# Patient Record
Sex: Male | Born: 1955 | Race: White | Hispanic: No | Smoking: Former smoker
Health system: Southern US, Community
[De-identification: ages and names within clinical notes are randomized; demographics above are authoritative.]

## PROBLEM LIST (undated history)

## (undated) DIAGNOSIS — R0600 Dyspnea, unspecified: Secondary | ICD-10-CM

## (undated) DIAGNOSIS — F32A Depression, unspecified: Secondary | ICD-10-CM

## (undated) DIAGNOSIS — M1990 Unspecified osteoarthritis, unspecified site: Secondary | ICD-10-CM

## (undated) DIAGNOSIS — F419 Anxiety disorder, unspecified: Secondary | ICD-10-CM

## (undated) DIAGNOSIS — H3412 Central retinal artery occlusion, left eye: Secondary | ICD-10-CM

## (undated) DIAGNOSIS — Z87442 Personal history of urinary calculi: Secondary | ICD-10-CM

## (undated) DIAGNOSIS — G4733 Obstructive sleep apnea (adult) (pediatric): Secondary | ICD-10-CM

## (undated) DIAGNOSIS — J189 Pneumonia, unspecified organism: Secondary | ICD-10-CM

## (undated) DIAGNOSIS — F329 Major depressive disorder, single episode, unspecified: Secondary | ICD-10-CM

## (undated) DIAGNOSIS — R7303 Prediabetes: Secondary | ICD-10-CM

## (undated) DIAGNOSIS — I10 Essential (primary) hypertension: Secondary | ICD-10-CM

## (undated) DIAGNOSIS — E785 Hyperlipidemia, unspecified: Secondary | ICD-10-CM

## (undated) DIAGNOSIS — K219 Gastro-esophageal reflux disease without esophagitis: Secondary | ICD-10-CM

## (undated) HISTORY — PX: COLONOSCOPY: SHX174

## (undated) HISTORY — DX: Essential (primary) hypertension: I10

## (undated) HISTORY — DX: Depression, unspecified: F32.A

## (undated) HISTORY — DX: Hyperlipidemia, unspecified: E78.5

## (undated) HISTORY — DX: Gastro-esophageal reflux disease without esophagitis: K21.9

## (undated) HISTORY — PX: LOOP RECORDER IMPLANT: SHX5954

---
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
## 2003-02-22 ENCOUNTER — Encounter: Payer: Self-pay | Admitting: Cardiovascular Disease

## 2003-02-22 ENCOUNTER — Encounter (HOSPITAL_COMMUNITY): Admission: RE | Admit: 2003-02-22 | Discharge: 2003-03-24 | Payer: Self-pay | Admitting: Cardiovascular Disease

## 2004-06-25 ENCOUNTER — Ambulatory Visit: Payer: Self-pay | Admitting: Family Medicine

## 2004-10-09 ENCOUNTER — Ambulatory Visit: Payer: Self-pay | Admitting: Family Medicine

## 2004-12-27 ENCOUNTER — Ambulatory Visit: Payer: Self-pay | Admitting: Family Medicine

## 2005-02-06 ENCOUNTER — Ambulatory Visit: Payer: Self-pay | Admitting: Family Medicine

## 2005-03-06 ENCOUNTER — Ambulatory Visit: Payer: Self-pay | Admitting: Family Medicine

## 2005-06-06 ENCOUNTER — Ambulatory Visit: Payer: Self-pay | Admitting: Family Medicine

## 2005-06-13 ENCOUNTER — Ambulatory Visit: Payer: Self-pay | Admitting: Family Medicine

## 2005-06-18 ENCOUNTER — Ambulatory Visit: Payer: Self-pay | Admitting: Family Medicine

## 2005-09-10 ENCOUNTER — Ambulatory Visit: Payer: Self-pay | Admitting: Family Medicine

## 2005-10-15 ENCOUNTER — Ambulatory Visit: Payer: Self-pay | Admitting: Family Medicine

## 2005-10-30 ENCOUNTER — Ambulatory Visit: Payer: Self-pay | Admitting: Family Medicine

## 2005-12-16 ENCOUNTER — Ambulatory Visit: Payer: Self-pay | Admitting: Family Medicine

## 2006-01-09 ENCOUNTER — Ambulatory Visit: Payer: Self-pay | Admitting: Family Medicine

## 2006-02-06 ENCOUNTER — Ambulatory Visit: Payer: Self-pay | Admitting: Family Medicine

## 2006-06-16 ENCOUNTER — Ambulatory Visit: Payer: Self-pay | Admitting: Family Medicine

## 2006-11-28 ENCOUNTER — Ambulatory Visit: Payer: Self-pay | Admitting: Physician Assistant

## 2014-03-30 DIAGNOSIS — E785 Hyperlipidemia, unspecified: Secondary | ICD-10-CM | POA: Insufficient documentation

## 2014-03-30 DIAGNOSIS — F411 Generalized anxiety disorder: Secondary | ICD-10-CM | POA: Insufficient documentation

## 2016-01-12 DIAGNOSIS — I10 Essential (primary) hypertension: Secondary | ICD-10-CM | POA: Insufficient documentation

## 2016-01-12 DIAGNOSIS — K219 Gastro-esophageal reflux disease without esophagitis: Secondary | ICD-10-CM | POA: Insufficient documentation

## 2016-01-12 DIAGNOSIS — E785 Hyperlipidemia, unspecified: Secondary | ICD-10-CM | POA: Insufficient documentation

## 2016-01-12 DIAGNOSIS — E559 Vitamin D deficiency, unspecified: Secondary | ICD-10-CM | POA: Insufficient documentation

## 2016-01-18 DIAGNOSIS — R7309 Other abnormal glucose: Secondary | ICD-10-CM | POA: Insufficient documentation

## 2016-04-25 DIAGNOSIS — F321 Major depressive disorder, single episode, moderate: Secondary | ICD-10-CM | POA: Insufficient documentation

## 2019-04-09 ENCOUNTER — Ambulatory Visit: Payer: Self-pay | Admitting: Physician Assistant

## 2019-04-09 ENCOUNTER — Other Ambulatory Visit: Payer: Self-pay

## 2019-04-12 ENCOUNTER — Ambulatory Visit (INDEPENDENT_AMBULATORY_CARE_PROVIDER_SITE_OTHER): Payer: Self-pay | Admitting: Physician Assistant

## 2019-04-12 ENCOUNTER — Encounter: Payer: Self-pay | Admitting: Physician Assistant

## 2019-04-12 ENCOUNTER — Other Ambulatory Visit: Payer: Self-pay

## 2019-04-12 VITALS — BP 135/70 | HR 82 | Temp 99.8°F | Ht 68.0 in | Wt 243.2 lb

## 2019-04-12 DIAGNOSIS — F411 Generalized anxiety disorder: Secondary | ICD-10-CM

## 2019-04-12 DIAGNOSIS — E785 Hyperlipidemia, unspecified: Secondary | ICD-10-CM

## 2019-04-12 DIAGNOSIS — K219 Gastro-esophageal reflux disease without esophagitis: Secondary | ICD-10-CM

## 2019-04-12 DIAGNOSIS — I10 Essential (primary) hypertension: Secondary | ICD-10-CM

## 2019-04-12 DIAGNOSIS — F321 Major depressive disorder, single episode, moderate: Secondary | ICD-10-CM

## 2019-04-12 DIAGNOSIS — E559 Vitamin D deficiency, unspecified: Secondary | ICD-10-CM

## 2019-04-12 MED ORDER — BUSPIRONE HCL 10 MG PO TABS: 10.0000 mg | ORAL_TABLET | Freq: Three times a day (TID) | ORAL | 2 refills | Status: DC

## 2019-04-12 MED ORDER — AMLODIPINE BESYLATE 5 MG PO TABS: ORAL_TABLET | ORAL | 3 refills | Status: DC

## 2019-04-12 MED ORDER — ESCITALOPRAM OXALATE 20 MG PO TABS: 20.0000 mg | ORAL_TABLET | Freq: Every day | ORAL | 3 refills | Status: DC

## 2019-04-12 MED ORDER — ATORVASTATIN CALCIUM 10 MG PO TABS: ORAL_TABLET | ORAL | 3 refills | Status: DC

## 2019-04-12 MED ORDER — PANTOPRAZOLE SODIUM 40 MG PO TBEC: 40.0000 mg | DELAYED_RELEASE_TABLET | Freq: Every day | ORAL | 3 refills | Status: DC

## 2019-04-13 NOTE — Progress Notes (Signed)
BP 135/70   Pulse 82   Temp 99.8 F (37.7 C) (Temporal)   Ht 5\' 8"  (1.727 m)   Wt 243 lb 3.2 oz (110.3 kg)   BMI 36.98 kg/m    Subjective:    Patient ID: Derek Ryan, male    DOB: 05/26/1956, 63 y.o.   MRN: [id number]  HPI: Derek Ryan is a 63 y.o. male presenting on 04/12/2019 for New Patient (Initial Visit)  Patient comes in to be established as a new patient.  He was seen through Dr. Edrick Oh for many years.  It is been many years since he has had any kind of cardiovascular evaluation.  He states that sometimes he does have chest pain when he wakes up at night.  He does have some anxiety.  And he states that he will have chest pain and some sweating.  And he does feel quite nervous.  He is not sure if it is either anxiety or cardiac.  He would like to try some medication and if things do not get better he does want to go to cardiology.  I have talked about Dr. Bronson Ing in [place] and he agrees to have an appointment set up there.  He does need labs to be performed and evaluated for this year.  He reports that he has had endoscopy and colonoscopy performed through [facility] providers.  He does have a long history of depression.  He states back in [id number] had a brother who died suddenly in an airplane crash and within a week his mother passed away.  Ever since then he is always that with a lot of depression.  About 4 years ago his wife died.  This also had him setback with depression.  All of his medications and conditions are reviewed.  Patient does have hypertension, GERD, vitamin D deficiency, obesity, depression, anxiety, dyslipidemia.  Depression screen PHQ 2/9 04/12/2019  Decreased Interest 0  Down, Depressed, Hopeless 0  PHQ - 2 Score 0    Past Medical History:  Diagnosis Date  . Depression   . GERD (gastroesophageal reflux disease)   . Hyperlipidemia   . Hypertension    Relevant past medical, surgical, family and social history reviewed and updated as  indicated. Interim medical history since our last visit reviewed. Allergies and medications reviewed and updated. DATA REVIEWED: CHART IN EPIC  Family History reviewed for pertinent findings.  Review of Systems  Constitutional: Negative.  Negative for appetite change and fatigue.  Eyes: Negative for pain and visual disturbance.  Respiratory: Negative.  Negative for cough, chest tightness, shortness of breath and wheezing.   Cardiovascular: Negative.  Negative for chest pain, palpitations and leg swelling.  Gastrointestinal: Negative.  Negative for abdominal pain, diarrhea, nausea and vomiting.  Genitourinary: Negative.   Skin: Negative.  Negative for color change and rash.  Neurological: Negative.  Negative for weakness, numbness and headaches.  Psychiatric/Behavioral: Positive for dysphoric mood. The patient is nervous/anxious.     Allergies as of 04/12/2019      Reactions   Doxycycline Itching      Medication List       Accurate as of April 12, 2019 11:59 PM. If you have any questions, ask your nurse or doctor.        amLODipine 5 MG tablet Commonly known as: NORVASC TAKE ONE TABLET (5 MG DOSE) BY MOUTH DAILY.   aspirin 81 MG chewable tablet Chew by mouth.   atorvastatin 10 MG tablet Commonly known as:  LIPITOR TAKE 1 TABLET BY MOUTH EVERYDAY AT BEDTIME   busPIRone 10 MG tablet Commonly known as: BUSPAR Take 1 tablet (10 mg total) by mouth 3 (three) times daily. For anxiety Started by: Terald Sleeper, PA-C   escitalopram 20 MG tablet Commonly known as: LEXAPRO Take 1 tablet (20 mg total) by mouth daily. What changed:   how much to take  when to take this Changed by: Terald Sleeper, PA-C   Fish Oil 1000 MG Caps Take by mouth.   pantoprazole 40 MG tablet Commonly known as: PROTONIX Take 1 tablet (40 mg total) by mouth daily.          Objective:    BP 135/70   Pulse 82   Temp 99.8 F (37.7 C) (Temporal)   Ht 5\' 8"  (1.727 m)   Wt 243 lb 3.2 oz  (110.3 kg)   BMI 36.98 kg/m   Allergies  Allergen Reactions  . Doxycycline Itching    Wt Readings from Last 3 Encounters:  04/12/19 243 lb 3.2 oz (110.3 kg)    Physical Exam Vitals signs and nursing note reviewed.  Constitutional:      General: He is not in acute distress.    Appearance: He is well-developed.  HENT:     Head: Normocephalic and atraumatic.  Eyes:     Conjunctiva/sclera: Conjunctivae normal.     Pupils: Pupils are equal, round, and reactive to light.  Cardiovascular:     Rate and Rhythm: Normal rate and regular rhythm.     Heart sounds: Normal heart sounds.  Pulmonary:     Effort: Pulmonary effort is normal. No respiratory distress.     Breath sounds: Normal breath sounds.  Skin:    General: Skin is warm and dry.  Psychiatric:        Behavior: Behavior normal.     No results found for this or any previous visit.    Assessment & Plan:   1. GAD (generalized anxiety disorder) - escitalopram (LEXAPRO) 20 MG tablet; Take 1 tablet (20 mg total) by mouth daily.  Dispense: 90 tablet; Refill: 3 - busPIRone (BUSPAR) 10 MG tablet; Take 1 tablet (10 mg total) by mouth 3 (three) times daily. For anxiety  Dispense: 90 tablet; Refill: 2  2. Essential hypertension - aspirin 81 MG chewable tablet; Chew by mouth. - amLODipine (NORVASC) 5 MG tablet; TAKE ONE TABLET (5 MG DOSE) BY MOUTH DAILY.  Dispense: 90 tablet; Refill: 3  3. Gastroesophageal reflux disease without esophagitis - pantoprazole (PROTONIX) 40 MG tablet; Take 1 tablet (40 mg total) by mouth daily.  Dispense: 90 tablet; Refill: 3  4. Vitamin D deficiency  5. Severe obesity (BMI 35.0-39.9) with comorbidity ([place])  6. Moderate single current episode of major depressive disorder (HCC) - escitalopram (LEXAPRO) 20 MG tablet; Take 1 tablet (20 mg total) by mouth daily.  Dispense: 90 tablet; Refill: 3  7. Dyslipidemia - Omega-3 Fatty Acids (FISH OIL) 1000 MG CAPS; Take by mouth. - atorvastatin (LIPITOR) 10  MG tablet; TAKE 1 TABLET BY MOUTH EVERYDAY AT BEDTIME  Dispense: 90 tablet; Refill: 3   Continue all other maintenance medications as listed above.  Follow up plan: No follow-ups on file.  Educational handout given for [place] PA-C [place] [address]  [place], [place] [address] [phone]   04/13/2019, 1:59 PM

## 2019-05-04 ENCOUNTER — Other Ambulatory Visit: Payer: Self-pay | Admitting: Physician Assistant

## 2019-05-04 DIAGNOSIS — F411 Generalized anxiety disorder: Secondary | ICD-10-CM

## 2019-05-10 ENCOUNTER — Ambulatory Visit (INDEPENDENT_AMBULATORY_CARE_PROVIDER_SITE_OTHER): Payer: Self-pay | Admitting: Physician Assistant

## 2019-05-10 ENCOUNTER — Encounter: Payer: Self-pay | Admitting: Physician Assistant

## 2019-05-10 DIAGNOSIS — F321 Major depressive disorder, single episode, moderate: Secondary | ICD-10-CM

## 2019-05-10 NOTE — Progress Notes (Signed)
     Telephone visit  Subjective: [id number] depression PCP: Terald Sleeper, PA-C [id number] Derek Ryan is a 63 y.o. male calls for telephone consult today. Patient provides verbal consent for consult held via phone.  Patient is identified with 2 separate identifiers.  At this time the entire area is on COVID-19 social distancing and stay home orders are in place.  Patient is of higher risk and therefore we are performing this by a virtual method.  Location of patient: home Location of provider: [facility] Others present for call: no   This patient is have a 1 month recheck on his depression anxiety.  We had increased his Lexapro to 20 mg and added BuSpar as a persistent anxiety medication.  He states that he can tell a lot of difference.  And is very happy with how he is feeling.  He will continue with the medications.  Refills will be sent.  We will plan for him to come in a few months for check in the office and labs.  He will be having Medicare in just another couple months.    ROS: Per HPI  Allergies  Allergen Reactions  . Doxycycline Itching   Past Medical History:  Diagnosis Date  . Depression   . GERD (gastroesophageal reflux disease)   . Hyperlipidemia   . Hypertension     Current Outpatient Medications:  .  amLODipine (NORVASC) 5 MG tablet, TAKE ONE TABLET (5 MG DOSE) BY MOUTH DAILY., Disp: 90 tablet, Rfl: 3 .  aspirin 81 MG chewable tablet, Chew by mouth., Disp: , Rfl:  .  atorvastatin (LIPITOR) 10 MG tablet, TAKE 1 TABLET BY MOUTH EVERYDAY AT BEDTIME, Disp: 90 tablet, Rfl: 3 .  busPIRone (BUSPAR) 10 MG tablet, TAKE 1 TABLET (10 MG TOTAL) BY MOUTH 3 (THREE) TIMES DAILY. FOR ANXIETY, Disp: 270 tablet, Rfl: 0 .  escitalopram (LEXAPRO) 20 MG tablet, Take 1 tablet (20 mg total) by mouth daily., Disp: 90 tablet, Rfl: 3 .  Omega-3 Fatty Acids (FISH OIL) 1000 MG CAPS, Take by mouth., Disp: , Rfl:  .  pantoprazole (PROTONIX) 40 MG tablet, Take 1 tablet (40 mg total) by mouth  daily., Disp: 90 tablet, Rfl: 3  Assessment/ Plan: 63 y.o. male   1. Moderate single current episode of major depressive disorder (HCC) Anxiety Continue BuSpar 10 mg 1 3 times a day Lexapro 20 mg once daily Recheck 3 months   Return in about 3 months (around 08/09/2019) for recheck medications and lab.  Continue all other maintenance medications as listed above.  Start time: 10:41 AM End time: 10:52 AM  No orders of the defined types were placed in this encounter.   Particia Nearing PA-C [place] [phone]

## 2019-05-24 ENCOUNTER — Encounter: Payer: Self-pay | Admitting: Family

## 2019-05-24 ENCOUNTER — Ambulatory Visit (INDEPENDENT_AMBULATORY_CARE_PROVIDER_SITE_OTHER): Payer: Self-pay | Admitting: Family

## 2019-05-24 DIAGNOSIS — J029 Acute pharyngitis, unspecified: Secondary | ICD-10-CM

## 2019-05-24 MED ORDER — AMOXICILLIN 875 MG PO TABS: 875.0000 mg | ORAL_TABLET | Freq: Two times a day (BID) | ORAL | 0 refills | Status: DC

## 2019-05-24 NOTE — Progress Notes (Signed)
Virtual Visit via telephone Note Due to COVID-19 pandemic this visit was conducted virtually. This visit type was conducted due to national recommendations for restrictions regarding the COVID-19 Pandemic (e.g. social distancing, sheltering in place) in an effort to limit this patient's exposure and mitigate transmission in our community. All issues noted in this document were discussed and addressed.  A physical exam was not performed with this format.  I connected with Derek Ryan on 05/24/19 at 10:00 AM  by telephone and verified that I am speaking with the correct person using two identifiers. Derek Ryan is currently located at home and no one is currently with him  during visit. The provider, Evelina Dun, FNP is located in their office at time of visit.  I discussed the limitations, risks, security and privacy concerns of performing an evaluation and management service by telephone and the availability of in person appointments. I also discussed with the patient that there may be a patient responsible charge related to this service. The patient expressed understanding and agreed to proceed.   History and Present Illness:   Headache  Associated symptoms include a fever and a sore throat.   Pt calls the office today with headache, body aches, fever, fatigue, and sore throat that started on 05/19/19. He reports he feels slightly better, but still not good. He reports he took a COVD test yesterday at [facility], but will be 2-3 days until his result return. He states his fever has been around 101. Denies any cough or SOB. States his symptoms started with a sore throat and has progressed to body aches and fever. He reports white patches in the back of his throat. He states his throat may be slightly better today. Denies any swollen lymph nodes.   Review of Systems  Constitutional: Positive for chills, fever and malaise/fatigue.  HENT: Positive for sore throat.   All other systems reviewed  and are negative.    Observations/Objective: No SOB or distress noted. He does sound slightly hoarse  Assessment and Plan: 1. Acute pharyngitis, unspecified etiology - Take meds as prescribed - Use a cool mist humidifier  -Use saline nose sprays frequently -Force fluids -For any cough or congestion  Use plain Mucinex- regular strength or max strength is fine -For fever or aces or pains- take tylenol or ibuprofen. -Throat lozenges if help -New toothbrush in 3 days -COVID test pending, he will call and let us know resutls Call if symptoms worsen or do not improve  - amoxicillin (AMOXIL) 875 MG tablet; Take 1 tablet (875 mg total) by mouth 2 (two) times daily.  Dispense: 14 tablet; Refill: 0     I discussed the assessment and treatment plan with the patient. The patient was provided an opportunity to ask questions and all were answered. The patient agreed with the plan and demonstrated an understanding of the instructions.   The patient was advised to call back or seek an in-person evaluation if the symptoms worsen or if the condition fails to improve as anticipated.  The above assessment and management plan was discussed with the patient. The patient verbalized understanding of and has agreed to the management plan. Patient is aware to call the clinic if symptoms persist or worsen. Patient is aware when to return to the clinic for a follow-up visit. Patient educated on when it is appropriate to go to the emergency department.   Time call ended:  10:17 AM   I provided 17 minutes of non-face-to-face time during  this encounter.    Evelina Dun, FNP

## 2019-05-25 ENCOUNTER — Telehealth: Payer: Self-pay | Admitting: Physician Assistant

## 2019-05-25 MED ORDER — ALBUTEROL SULFATE HFA 108 (90 BASE) MCG/ACT IN AERS: 2.0000 | INHALATION_SPRAY | Freq: Four times a day (QID) | RESPIRATORY_TRACT | 0 refills | Status: DC | PRN

## 2019-05-25 NOTE — Telephone Encounter (Signed)
Ok, pt does not need to take the amoxicillin as he more than likely does not have strep. I have sent albuterol inhaler for him. If his SOB worsens he needs to call or go to ED!!!

## 2019-05-25 NOTE — Telephone Encounter (Signed)
Patient aware and verbalizes understanding. 

## 2019-06-12 ENCOUNTER — Other Ambulatory Visit: Payer: Self-pay | Admitting: Family

## 2019-06-22 ENCOUNTER — Encounter: Payer: Self-pay | Admitting: Cardiology

## 2019-06-24 ENCOUNTER — Ambulatory Visit (INDEPENDENT_AMBULATORY_CARE_PROVIDER_SITE_OTHER): Payer: Medicare Other | Admitting: Cardiology

## 2019-06-24 ENCOUNTER — Encounter: Payer: Self-pay | Admitting: Cardiology

## 2019-06-24 ENCOUNTER — Other Ambulatory Visit: Payer: Self-pay

## 2019-06-24 VITALS — BP 138/69 | HR 90 | Ht 68.0 in | Wt 231.5 lb

## 2019-06-24 DIAGNOSIS — I5031 Acute diastolic (congestive) heart failure: Secondary | ICD-10-CM | POA: Diagnosis not present

## 2019-06-24 DIAGNOSIS — I429 Cardiomyopathy, unspecified: Secondary | ICD-10-CM

## 2019-06-24 DIAGNOSIS — R0609 Other forms of dyspnea: Secondary | ICD-10-CM

## 2019-06-24 DIAGNOSIS — I10 Essential (primary) hypertension: Secondary | ICD-10-CM

## 2019-06-24 DIAGNOSIS — R0600 Dyspnea, unspecified: Secondary | ICD-10-CM | POA: Diagnosis not present

## 2019-06-24 DIAGNOSIS — I428 Other cardiomyopathies: Secondary | ICD-10-CM

## 2019-06-24 DIAGNOSIS — E7800 Pure hypercholesterolemia, unspecified: Secondary | ICD-10-CM

## 2019-06-24 DIAGNOSIS — R739 Hyperglycemia, unspecified: Secondary | ICD-10-CM

## 2019-06-24 MED ORDER — POTASSIUM CHLORIDE ER 10 MEQ PO TBCR: 10.0000 meq | EXTENDED_RELEASE_TABLET | ORAL | 0 refills | Status: DC

## 2019-06-24 MED ORDER — FUROSEMIDE 20 MG PO TABS: 20.0000 mg | ORAL_TABLET | ORAL | 0 refills | Status: DC

## 2019-06-24 NOTE — Progress Notes (Signed)
Primary Physician/Referring:  Terald Sleeper, PA-C  Patient ID: Derek Ryan, male    DOB: 1956-04-15, 63 y.o.   MRN: [id number]  Chief Complaint  Patient presents with  . New Patient (Initial Visit)  . Palpitations  . Chest Pain  . Shortness of Breath   HPI:    Derek Ryan  is a 63 y.o. Caucasian male with hypertension, hyperlipidemia, hyperglycemia, anxiety and depression, admitted to [facility], [place], [place] with bilateral coving 19 pneumonia on 05/24/2019 and discharged 6 days later.  The hospital he was found to have type II non-STEMI due to severe hypoxemia and also echocardiogram revealed moderate to severe tricuspid regurgitation and pulmonary hypertension.  He was referred to be further evaluated from cardiac standpoint.  Since hospitalization, patient has gradually improved overall however continues to have significant shortness of breath and especially orthopnea.  He is also noticed exertional chest tightness and pressure-like sensation in the middle of the chest.  He had not had these episodes prior to presentation with Covid 19.  Denies leg swelling, he does not smoke, quit smoking remotely and does not drink alcohol.  Past Medical History:  Diagnosis Date  . Depression   . GERD (gastroesophageal reflux disease)   . Hyperlipidemia   . Hypertension    History reviewed. No pertinent surgical history. Social History   Socioeconomic History  . Marital status: Widowed    Spouse name: Not on file  . Number of children: 3  . Years of education: Not on file  . Highest education level: Not on file  Occupational History  . Not on file  Social Needs  . Financial resource strain: Not on file  . Food insecurity    Worry: Not on file    Inability: Not on file  . Transportation needs    Medical: Not on file    Non-medical: Not on file  Tobacco Use  . Smoking status: Former Smoker    Packs/day: 1.00    Years: 23.00    Pack years: 23.00    Types: Cigarettes     Quit date: 1992    Years since quitting: 28.8  . Smokeless tobacco: Never Used  Substance and Sexual Activity  . Alcohol use: Not Currently    Frequency: Never    Comment: stopped in 1997  . Drug use: Never  . Sexual activity: Not on file  Lifestyle  . Physical activity    Days per week: Not on file    Minutes per session: Not on file  . Stress: Not on file  Relationships  . Social Herbalist on phone: Not on file    Gets together: Not on file    Attends religious service: Not on file    Active member of club or organization: Not on file    Attends meetings of clubs or organizations: Not on file    Relationship status: Not on file  . Intimate partner violence    Fear of current or ex partner: Not on file    Emotionally abused: Not on file    Physically abused: Not on file    Forced sexual activity: Not on file  Other Topics Concern  . Not on file  Social History Narrative  . Not on file   ROS  Review of Systems  Constitution: Negative for chills, decreased appetite, malaise/fatigue and weight gain.  Cardiovascular: Positive for dyspnea on exertion, orthopnea and paroxysmal nocturnal dyspnea. Negative for leg swelling and syncope.  Endocrine: Negative for cold intolerance.  Hematologic/Lymphatic: Does not bruise/bleed easily.  Musculoskeletal: Positive for muscle cramps (occasional leg cramps with activity). Negative for joint swelling.  Gastrointestinal: Negative for abdominal pain, anorexia, change in bowel habit, hematochezia and melena.  Neurological: Negative for headaches and light-headedness.  Psychiatric/Behavioral: Negative for depression and substance abuse.  All other systems reviewed and are negative.  Objective   Vitals with BMI 06/24/2019 04/12/2019  Height '5\' 8"'  '5\' 8"'   Weight 231 lbs 8 oz 243 lbs 3 oz  BMI 64.15 83.09  Systolic 407 680  Diastolic 69 70  Pulse 90 82    Physical Exam  Constitutional:  Well-built, moderately obese, no  acute distress.  HENT:  Head: Atraumatic.  Eyes: Conjunctivae are normal.  Neck: Neck supple. No thyromegaly present.  Short neck and difficult to evaluate JVP  Cardiovascular: Normal rate, regular rhythm and normal heart sounds. Exam reveals no gallop.  No murmur heard. Pulses:      Carotid pulses are 2+ on the right side and 2+ on the left side.      Dorsalis pedis pulses are 1+ on the right side and 1+ on the left side.       Posterior tibial pulses are 1+ on the right side and 1+ on the left side.  Femoral and popliteal pulse difficult to feel due to patient's body habitus.   Pulmonary/Chest: Effort normal and breath sounds normal.  Abdominal: Soft. Bowel sounds are normal.  Obese.t  Musculoskeletal: Normal range of motion.        General: No edema.  Neurological: He is alert.  Skin: Skin is warm and dry.  Psychiatric: He has a normal mood and affect.    Laboratory examination:   CBC And DifferentialResulted: 07/28/2018 5:36 AM [facility] Component Name Value Ref Range  WBC 6.2 3.4 - 10.8 x10E3/uL  RBC 4.92 4.14 - 5.8 x10E6/uL  Hemoglobin 14.8 13 - 17.7 g/dL  Hematocrit 44.3 37.5 - 51 %  MCV 90 79 - 97 fL  MCH 30.1 26.6 - 33 pg  MCHC 33.4 31.5 - 35.7 g/dL  RDW 12.3    Comprehensive Metabolic PanelResulted: 88/06/314 5:36 AM [facility] Component Name Value Ref Range  Glucose 108 (H) 65 - 99 mg/dL  BUN 20 8 - 27 mg/dL  Creatinine, Serum 0.89 0.76 - 1.27 mg/dL  eGFR If NonAfrican American 90 >59 mL/min/1.73  eGFR If African American 104 >59 mL/min/1.73  BUN/Creatinine Ratio 22 10 - 24   Sodium 142 134 - 144 mmol/L  Potassium 4.6 3.5 - 5.2 mmol/L  Chloride 101 96 - 106 mmol/L  CO2 24 20 - 29 mmol/L  CALCIUM 9.7 8.6 - 10.2 mg/dL  Total Protein 7.1 6 - 8.5 g/dL  Albumin, Serum 4.6 3.6 - 4.8 g/dL  Globulin, Total 2.5 1.5 - 4.5 g/dL  Albumin/Globulin Ratio 1.8 1.2 - 2.2   Total Bilirubin 0.5 0 - 1.2 mg/dL  Alkaline Phosphatase 94 39 - 117 IU/L  AST 19 0 -  40 IU/L  ALT (SGPT) 29 0 - 44 IU/L   Lipid PanelResulted: 07/28/2018 5:36 AM [facility] Component Name Value Ref Range  Cholesterol, Total 121 100 - 199 mg/dL  Triglycerides 101 0 - 149 mg/dL  HDL 41 >39 mg/dL  VLDL Cholesterol Cal 20 5 - 40 mg/dL  LDL 60 0 - 99 mg/dL   TSHLast Updated: 01/12/2016 [facility] Component Name Value Ref Range  TSH 1.660 0.450 - 4.500 uIU/mL     No results  for input(s): NA, K, CL, CO2, GLUCOSE, BUN, CREATININE, CALCIUM, GFRNONAA, GFRAA in the last 8760 hours. No flowsheet data found. No flowsheet data found. Lipid Panel  No results found for: CHOL, TRIG, HDL, CHOLHDL, VLDL, LDLCALC, LDLDIRECT HEMOGLOBIN A1C No results found for: HGBA1C, MPG TSH No results for input(s): TSH in the last 8760 hours. Medications and allergies   Allergies  Allergen Reactions  . Doxycycline Itching     Prior to Admission medications   Medication Sig Start Date End Date Taking? Authorizing Provider  albuterol (VENTOLIN HFA) 108 (90 Base) MCG/ACT inhaler TAKE 2 PUFFS BY MOUTH EVERY 6 HOURS AS NEEDED FOR WHEEZE OR SHORTNESS OF BREATH 06/14/19   Terald Sleeper, PA-C  amLODipine (NORVASC) 5 MG tablet TAKE ONE TABLET (5 MG DOSE) BY MOUTH DAILY. 04/12/19   Terald Sleeper, PA-C  aspirin 81 MG chewable tablet Chew by mouth daily.    [provider]  atorvastatin (LIPITOR) 10 MG tablet TAKE 1 TABLET BY MOUTH EVERYDAY AT BEDTIME 04/12/19   Terald Sleeper, PA-C  busPIRone (BUSPAR) 10 MG tablet TAKE 1 TABLET (10 MG TOTAL) BY MOUTH 3 (THREE) TIMES DAILY. FOR ANXIETY 05/06/19   Terald Sleeper, PA-C  escitalopram (LEXAPRO) 20 MG tablet Take 1 tablet (20 mg total) by mouth daily. 04/12/19   Terald Sleeper, PA-C  Omega-3 Fatty Acids (FISH OIL) 1000 MG CAPS Take by mouth.    [provider]     Current Outpatient Medications  Medication Instructions  . albuterol (VENTOLIN HFA) 108 (90 Base) MCG/ACT inhaler TAKE 2 PUFFS BY MOUTH EVERY 6 HOURS AS NEEDED FOR WHEEZE  OR SHORTNESS OF BREATH  . amLODipine (NORVASC) 5 MG tablet TAKE ONE TABLET (5 MG DOSE) BY MOUTH DAILY.  [name] aspirin 81 MG chewable tablet Oral, Daily  . atorvastatin (LIPITOR) 10 MG tablet TAKE 1 TABLET BY MOUTH EVERYDAY AT BEDTIME  . busPIRone (BUSPAR) 10 mg, Oral, 3 times daily, For anxiety  . cholecalciferol (VITAMIN D3) 2,000 Units, Oral, Daily  . escitalopram (LEXAPRO) 20 mg, Oral, Daily  . furosemide (LASIX) 20 mg, Oral, BH-each morning  . Omega-3 Fatty Acids (FISH OIL) 1000 MG CAPS Oral  . potassium chloride (KLOR-CON) 10 MEQ tablet 10 mEq, Oral, BH-each morning    Radiology:  No results found.  Cardiac Studies:   Echocardiogram 05/27/2019:  Normal LV size and function, EF 55%.  Severe LVH.  Increased echogenicity of myocardium, strongly recommended investigation for infiltrative diseases. Normal RV size and function.  Moderate mitral regurgitation, moderate to severe tricuspid regurgitation, severe pulmonary hypertension.  PA systolic pressure 67 mmHg, RV pressure 7 mmHg.   Assessment     ICD-10-CM   1. Dyspnea on exertion  R06.00 EKG 12-Lead    furosemide (LASIX) 20 MG tablet    potassium chloride (KLOR-CON) 10 MEQ tablet    PCV MYOCARDIAL PERFUSION WO LEXISCAN    MR CARDIAC MORPHOLOGY W WO CONTRAST  2. Acute diastolic CHF (congestive heart failure) (HCC)  I50.31 furosemide (LASIX) 20 MG tablet    potassium chloride (KLOR-CON) 10 MEQ tablet    PCV MYOCARDIAL PERFUSION WO LEXISCAN    MR CARDIAC MORPHOLOGY W WO CONTRAST  3. Essential hypertension  I10   4. Pure hypercholesterolemia  E78.00   5. Hyperglycemia  R73.9   6. Severe obesity (BMI 35.0-39.9) with comorbidity ([place])  E66.01   7. 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  Low-voltage complexes.  No evidence of ischemia.  No significant change from  05/26/2019  Recommendations:    Extremely complex patient with dyspnea on exertion, PND and orthopnea, hypertension, hyperlipidemia well controlled, hyperglycemia and recent diagnosis of coving 19 pneumonia on 05/26/2019 presents here for establishing cardiac care. Troponin minimally elevated in the hospital and now with dyspnea on exertion, PND.  Symptoms are very suggestive of acute on chronic diastolic heart failure.  In view of his abnormal echocardiogram, although there are discrepancies related to atrial dimensions, severe LVH with speckled pattern suggestive of infiltrative cardio myopathy, he needs cardiac MR and also exercise Myoview stress test to exclude CAD.  I started him on furosemide 20 mg daily along with potassium supplement.  Blood pressure is well controlled, lipids are well controlled, I'd like to see him back after the test.  Overall this other cardiac vascular structures includes peripheral arterial disease by examination, prior tobacco use, hypertension, hyperlipidemia, hyperglycemia.  This was a 70 minute office visit with greater than 50% of the time spent face-to-face with discussions regarding his cardiac condition, prior evaluations.  Adrian Prows, MD, [facility] 06/24/2019, 10:36 AM [facility]. [place] Pager: [phone] Office: [phone] If no answer Cell [phone]

## 2019-07-01 ENCOUNTER — Telehealth (HOSPITAL_COMMUNITY): Payer: Self-pay | Admitting: Emergency Medicine

## 2019-07-01 NOTE — Telephone Encounter (Signed)
Reaching out to patient to offer assistance regarding upcoming cardiac imaging study; pt verbalizes understanding of appt date/time, parking situation and where to check in, and verified current allergies; name and call back number provided for further questions should they arise Marchia Bond RN Navigator Cardiac Imaging [facility] [phone] office [phone] cell  Pt covid + 05/23/19; no mostly symptom free except for lingering fatigue Pt denies metal, implants, or insulin devices; denies claustrophobia

## 2019-07-05 ENCOUNTER — Other Ambulatory Visit: Payer: Self-pay

## 2019-07-05 ENCOUNTER — Ambulatory Visit (HOSPITAL_COMMUNITY)
Admission: RE | Admit: 2019-07-05 | Discharge: 2019-07-05 | Disposition: A | Discharge status: Home / Self Care | Payer: Medicare Other | Source: Ambulatory Visit | Attending: Cardiology | Admitting: Cardiology

## 2019-07-05 DIAGNOSIS — R0600 Dyspnea, unspecified: Secondary | ICD-10-CM | POA: Insufficient documentation

## 2019-07-05 DIAGNOSIS — R0609 Other forms of dyspnea: Secondary | ICD-10-CM

## 2019-07-05 DIAGNOSIS — I429 Cardiomyopathy, unspecified: Secondary | ICD-10-CM | POA: Insufficient documentation

## 2019-07-05 DIAGNOSIS — I5031 Acute diastolic (congestive) heart failure: Secondary | ICD-10-CM

## 2019-07-05 DIAGNOSIS — I428 Other cardiomyopathies: Secondary | ICD-10-CM

## 2019-07-05 LAB — CREATININE, SERUM
Creatinine, Ser: 0.77 mg/dL (ref 0.61–1.24)
GFR calc Af Amer: >60 mL/min (ref >60)
GFR calc non Af Amer: >60 mL/min (ref >60)

## 2019-07-05 IMAGING — MR MR CARD MORPHOLOGY WO/W CM
39 of 41 series · 39 of 41 positions shown · IV contrast (gadavist)
Comparison: none

CLINICAL DATA: 63-year-old male with h/o chronic diastolic CHF and
suspicion for an infiltrative cardiomyopathy on echocardiogram.

EXAM:
CARDIAC MRI
TECHNIQUE: The patient was scanned on a 1.5 Tesla GE magnet. A dedicated
cardiac coil was used. Functional imaging was done using Fiesta
sequences. [DATE], and 4 chamber views were done to assess for RWMA's.
Modified JAYME rule using a short axis stack was used to
calculate an ejection fraction on a dedicated work station using
Circle software. The patient received 7 cc of Gadavist. After 10
minutes inversion recovery sequences were used to assess for
infiltration and scar tissue.
CONTRAST:  7 cc  of Gadavist

[Series 6: bSSFP · oblique · 8.0mm · 1.61mm/px · 1 of 25 slices shown (1 of 20)]
[im 1/25]
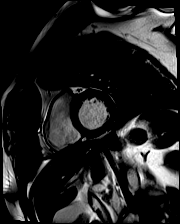

[Series 6: bSSFP · oblique · 8.0mm · 1.61mm/px · 1 of 25 slices shown (2 of 20)]
[im 1/25]
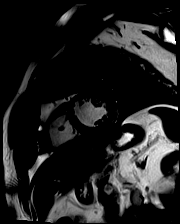

[Series 6: bSSFP · oblique · 8.0mm · 1.61mm/px · 1 of 25 slices shown (3 of 20)]
[im 1/25]
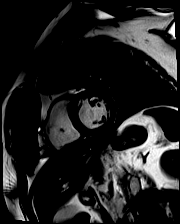

[Series 6: bSSFP · oblique · 8.0mm · 1.61mm/px · 1 of 25 slices shown (4 of 20)]
[im 1/25]
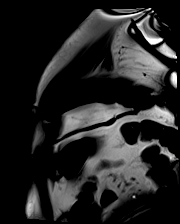

[Series 6: bSSFP · oblique · 8.0mm · 1.61mm/px · 1 of 25 slices shown (5 of 20)]
[im 1/25]
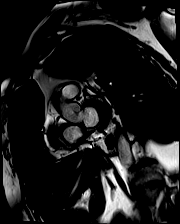

[Series 6: bSSFP · oblique · 8.0mm · 1.61mm/px · 1 of 25 slices shown (6 of 20)]
[im 1/25]
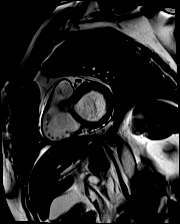

[Series 6: bSSFP · oblique · 8.0mm · 1.61mm/px · 1 of 25 slices shown (7 of 20)]
[im 1/25]
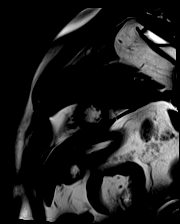

[Series 6: bSSFP · oblique · 8.0mm · 1.61mm/px · 1 of 25 slices shown (8 of 20)]
[im 1/25]
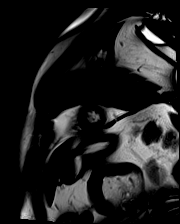

[Series 6: bSSFP · oblique · 8.0mm · 1.61mm/px · 1 of 25 slices shown (9 of 20)]
[im 1/25]
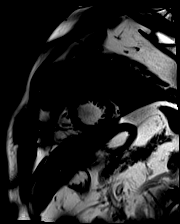

[Series 6: bSSFP · oblique · 8.0mm · 1.61mm/px · 1 of 25 slices shown (10 of 20)]
[im 1/25]
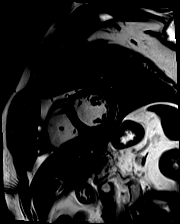

[Series 6: bSSFP · oblique · 8.0mm · 1.61mm/px · 1 of 25 slices shown (11 of 20)]
[im 1/25]
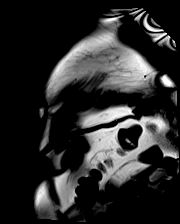

[Series 6: bSSFP · oblique · 8.0mm · 1.61mm/px · 1 of 25 slices shown (12 of 20)]
[im 1/25]
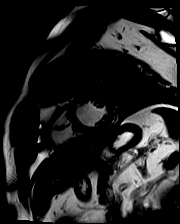

[Series 6: bSSFP · oblique · 8.0mm · 1.61mm/px · 1 of 25 slices shown (13 of 20)]
[im 1/25]
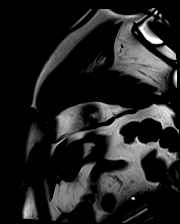

[Series 6: bSSFP · oblique · 8.0mm · 1.61mm/px · 1 of 25 slices shown (14 of 20)]
[im 1/25]
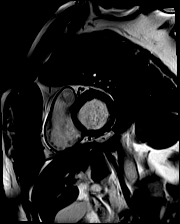

[Series 6: bSSFP · oblique · 8.0mm · 1.61mm/px · 1 of 25 slices shown (15 of 20)]
[im 1/25]
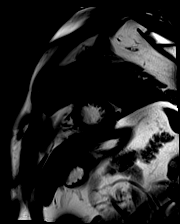

[Series 6: bSSFP · oblique · 8.0mm · 1.61mm/px · 1 of 25 slices shown (16 of 20)]
[im 1/25]
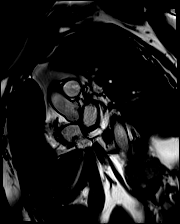

[Series 6: bSSFP · oblique · 8.0mm · 1.61mm/px · 1 of 25 slices shown (17 of 20)]
[im 1/25]
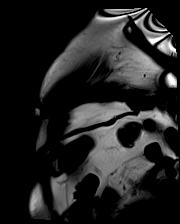

[Series 7: bSSFP · oblique · 6.0mm · 1.41mm/px · 1 of 25 slices shown (18 of 20)]
[im 1/25]
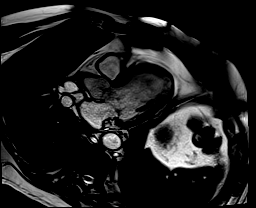

[Series 8: bSSFP · axial · 6.0mm · 1.41mm/px · 1 of 25 slices shown (19 of 20)]
[im 1/25]
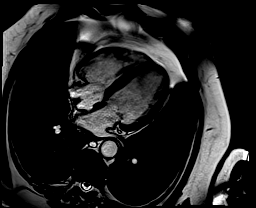

[Series 9: bSSFP · oblique · 6.0mm · 1.41mm/px · 1 of 25 slices shown (20 of 20)]
[im 1/25]
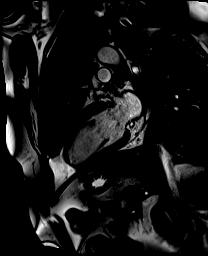

[Series 10: t2_stir_db_sax · oblique · 8.0mm · 1.73mm/px · 1 of 17 slices shown]
[im 1/17]
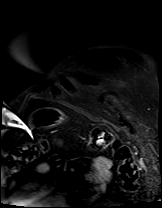

[Series 11: t2_stir_db_radial ((date)ch) · axial · 6.0mm · 1.73mm/px · 1 of 2 slices shown]
[im 1/2]
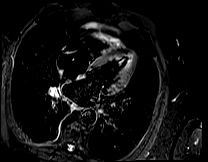

[Series 12: (id)_long_t1 · oblique · 8.0mm · 1.41mm/px · 1 of 24 slices shown]
[im 1/24]
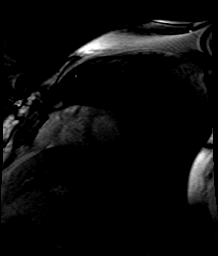

[Series 13: (id)_long_t1_moco · oblique · 8.0mm · 1.41mm/px · 1 of 24 slices shown]
[im 1/24]
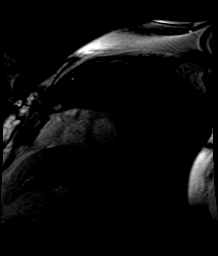

[Series 14: (id)_long_t1_moco_t1 · oblique · 8.0mm · 1.41mm/px · 1 of 6 slices shown]
[im 1/6]
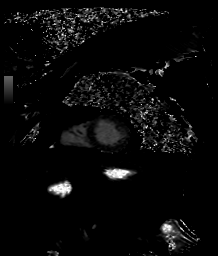

[Series 16: (id)_trufi · oblique · 8.0mm · 1.88mm/px · 1 of 9 slices shown]
[im 1/9]
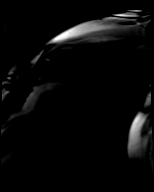

[Series 17: (id)_trufi_moco · oblique · 8.0mm · 1.88mm/px · 1 of 9 slices shown]
[im 1/9]
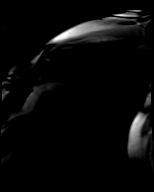

[Series 18: (id)_trufi_moco_t2 · oblique · 8.0mm · 1.88mm/px · 1 of 3 slices shown]
[im 1/3]
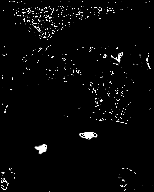

[Series 21: lge_single shot sa · oblique · 8.0mm · 1.98mm/px · 1 of 17 slices shown (1 of 2)]
[im 1/17]
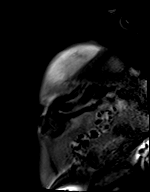

[Series 22: lge_single shot sa · oblique · 8.0mm · 1.98mm/px · 1 of 17 slices shown (2 of 2)]
[im 1/17]
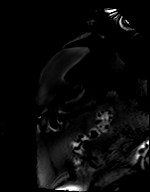

[Series 25: lge_single shot radial_mag · axial · 6.0mm · 1.98mm/px · 1 of 1 slices shown]
[im 1/1]
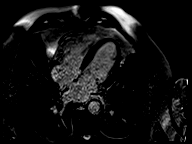

[Series 26: lge_single shot radial_psir · axial · 6.0mm · 1.98mm/px · 1 of 1 slices shown]
[im 1/1]
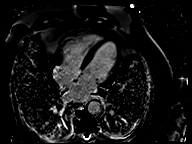

[Series 29: (id)_short_t1 · oblique · 8.0mm · 1.41mm/px · 1 of 27 slices shown]
[im 1/27]
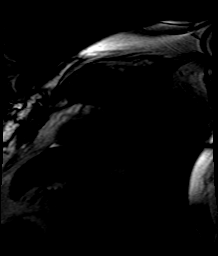

[Series 30: (id)_short_t1_moco · oblique · 8.0mm · 1.41mm/px · 1 of 27 slices shown]
[im 1/27]
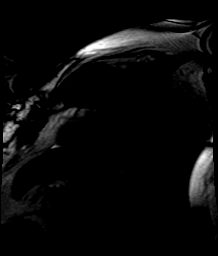

[Series 31: (id)_short_t1_moco_t1 · oblique · 8.0mm · 1.41mm/px · 1 of 5 slices shown]
[im 1/5]
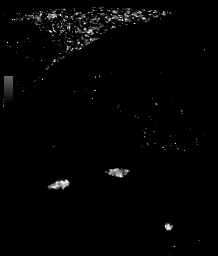

[Series 34: lge short axis_mag · oblique · 8.0mm · 1.61mm/px · 1 of 17 slices shown]
[im 1/17]
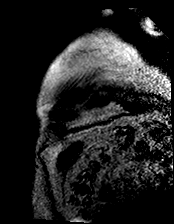

[Series 35: lge short axis_psir · oblique · 8.0mm · 1.61mm/px · 1 of 17 slices shown]
[im 1/17]
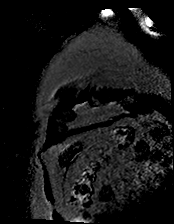

[Series 38: lge radial ((date)ch)_mag · axial · 6.0mm · 1.61mm/px · 1 of 1 slices shown]
[im 1/1]
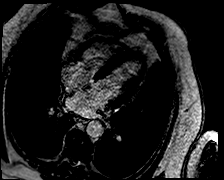

[Series 39: lge radial ((date)ch)_psir · axial · 6.0mm · 1.61mm/px · 1 of 1 slices shown]
[im 1/1]
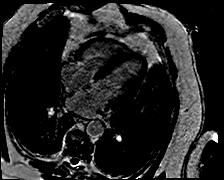

[39 of 41 positions shown; findings below may reference images not displayed]

FINDINGS: 1. Mildly dilated left ventricle with moderate concentric left
ventricular hypertrophy and normal left ventricular systolic
function (LVEF = 60%). There are no regional wall motion
abnormalities.

There is no late gadolinium enhancement in the left ventricular
myocardium.

LVEDD: 57 mm

LVESD: 41 mm

LVEDV: 156 ml

LVESV: 62 ml

SV: 94 ml

CO: 6.6 L/min

Myocardial mass: 147 g

2. Normal right ventricular size, thickness and systolic function
(LVEF = 53%). There are no regional wall motion abnormalities.

3.  Moderately dilated left and right atrial size.

4. Normal size of the aortic root, ascending aorta and pulmonary
artery.

5.  Moderate mitral and tricuspid regurgitation.

6.  Normal pericardium.  No pericardial effusion.
IMPRESSION: 1. Mildly dilated left ventricle with moderate concentric left
ventricular hypertrophy and normal left ventricular systolic
function (LVEF = 60%). There are no regional wall motion
abnormalities.

There is no late gadolinium enhancement in the left ventricular
myocardium.

2. Normal right ventricular size, thickness and systolic function
(LVEF = 53%). There are no regional wall motion abnormalities.

3.  Moderately dilated left and right atrial size.

4. Normal size of the aortic root, ascending aorta and pulmonary
artery.

5.  Moderate mitral and tricuspid regurgitation.

6.  Normal pericardium.  No pericardial effusion.

These findings are not consistent with an infiltrative
cardiomyopathy. T1 mapping showed normal extracellular volume (ECV)
of 22% (normal < 25%).

## 2019-07-05 MED ORDER — GADOBUTROL 1 MMOL/ML IV SOLN
10.0000 mL | Freq: Once | INTRAVENOUS | Status: AC | PRN
  Administered 2019-07-05: 10:00:00 10 mL via INTRAVENOUS

## 2019-07-09 ENCOUNTER — Telehealth: Payer: Self-pay

## 2019-07-09 NOTE — Telephone Encounter (Signed)
Pt called asking for his lab results, could you please review it. Please advise and thank you

## 2019-07-09 NOTE — Telephone Encounter (Signed)
No idea what labs. I have not done any

## 2019-07-12 ENCOUNTER — Ambulatory Visit (INDEPENDENT_AMBULATORY_CARE_PROVIDER_SITE_OTHER): Payer: Medicare Other

## 2019-07-12 ENCOUNTER — Other Ambulatory Visit: Payer: Self-pay

## 2019-07-12 DIAGNOSIS — R0609 Other forms of dyspnea: Secondary | ICD-10-CM

## 2019-07-12 DIAGNOSIS — I5031 Acute diastolic (congestive) heart failure: Secondary | ICD-10-CM | POA: Diagnosis not present

## 2019-07-16 ENCOUNTER — Other Ambulatory Visit: Payer: Self-pay | Admitting: Cardiology

## 2019-07-16 DIAGNOSIS — R0609 Other forms of dyspnea: Secondary | ICD-10-CM

## 2019-07-16 DIAGNOSIS — I5031 Acute diastolic (congestive) heart failure: Secondary | ICD-10-CM

## 2019-07-19 ENCOUNTER — Encounter: Payer: Self-pay | Admitting: Cardiology

## 2019-07-19 ENCOUNTER — Other Ambulatory Visit: Payer: Self-pay

## 2019-07-19 ENCOUNTER — Ambulatory Visit (INDEPENDENT_AMBULATORY_CARE_PROVIDER_SITE_OTHER): Payer: Medicare Other | Admitting: Cardiology

## 2019-07-19 VITALS — BP 132/73 | HR 92 | Temp 97.6°F | Ht 68.0 in | Wt 235.0 lb

## 2019-07-19 DIAGNOSIS — I110 Hypertensive heart disease with heart failure: Secondary | ICD-10-CM | POA: Diagnosis not present

## 2019-07-19 DIAGNOSIS — I5032 Chronic diastolic (congestive) heart failure: Secondary | ICD-10-CM | POA: Diagnosis not present

## 2019-07-19 DIAGNOSIS — R0600 Dyspnea, unspecified: Secondary | ICD-10-CM

## 2019-07-19 DIAGNOSIS — I10 Essential (primary) hypertension: Secondary | ICD-10-CM

## 2019-07-19 DIAGNOSIS — R0609 Other forms of dyspnea: Secondary | ICD-10-CM

## 2019-07-19 MED ORDER — FUROSEMIDE 20 MG PO TABS: 20.0000 mg | ORAL_TABLET | Freq: Every day | ORAL | 0 refills | Status: DC | PRN

## 2019-07-19 MED ORDER — POTASSIUM CHLORIDE ER 10 MEQ PO TBCR: 10.0000 meq | EXTENDED_RELEASE_TABLET | Freq: Every day | ORAL | 0 refills | Status: DC | PRN

## 2019-07-19 MED ORDER — LOSARTAN POTASSIUM-HCTZ 50-12.5 MG PO TABS: 1.0000 | ORAL_TABLET | Freq: Every day | ORAL | 2 refills | Status: DC

## 2019-07-19 NOTE — Patient Instructions (Signed)
Take Furosemide and potassium tablets only if needed for marked shortness of breath.   Take new medicatoin Losartan HCT daily.  Get blood work done in 2 weeks. I will see you in 6 months.

## 2019-07-19 NOTE — Progress Notes (Signed)
Primary Physician/Referring:  Terald Sleeper, PA-C  Patient ID: Derek Ryan, male    DOB: 27-Apr-1956, 63 y.o.   MRN: [id number]  No chief complaint on file.  HPI:    Derek Ryan  is a 63 y.o.  Caucasian male with hypertension, hyperlipidemia, hyperglycemia, anxiety and depression, admitted to [facility], [place], [place] with bilateral covid 19 pneumonia on 05/24/2019 and discharged 6 days later.  He was seen by me on 06/24/2019. He presented with symptoms of acute diastolic heart failure and I had started him on furosemide along with potassium supplements, underwent extensive evaluation including cardiac MR and nuclear stress test and presents for follow-up.  His echocardiogram performed in the outside hospital had revealed severe LVH and speckled pattern. He does not smoke, quit smoking remotely and does not drink alcohol.  He is presentation with chest tightness and acute dyspnea where indicated to of acute diastolic heart failure, I had started him on Lasix.  He now presents for follow-up, states that his dyspnea has improved remarkably and he has nearly return back to full activity.  He still has mild dyspnea on exertion.  He has not had any recurrence of chest pain.  Past Medical History:  Diagnosis Date  . Depression   . GERD (gastroesophageal reflux disease)   . Hyperlipidemia   . Hypertension    History reviewed. No pertinent surgical history. Social History   Socioeconomic History  . Marital status: Widowed    Spouse name: Not on file  . Number of children: 3  . Years of education: Not on file  . Highest education level: Not on file  Occupational History  . Not on file  Social Needs  . Financial resource strain: Not on file  . Food insecurity    Worry: Not on file    Inability: Not on file  . Transportation needs    Medical: Not on file    Non-medical: Not on file  Tobacco Use  . Smoking status: Former Smoker    Packs/day: 1.00    Years: 23.00    Pack  years: 23.00    Types: Cigarettes    Quit date: 1992    Years since quitting: 28.9  . Smokeless tobacco: Never Used  Substance and Sexual Activity  . Alcohol use: Not Currently    Frequency: Never    Comment: stopped in 1997  . Drug use: Never  . Sexual activity: Not on file  Lifestyle  . Physical activity    Days per week: Not on file    Minutes per session: Not on file  . Stress: Not on file  Relationships  . Social Herbalist on phone: Not on file    Gets together: Not on file    Attends religious service: Not on file    Active member of club or organization: Not on file    Attends meetings of clubs or organizations: Not on file    Relationship status: Not on file  . Intimate partner violence    Fear of current or ex partner: Not on file    Emotionally abused: Not on file    Physically abused: Not on file    Forced sexual activity: Not on file  Other Topics Concern  . Not on file  Social History Narrative  . Not on file   ROS  Review of Systems  Constitution: Negative for chills, decreased appetite, malaise/fatigue and weight gain.  Cardiovascular: Positive for dyspnea on exertion.  Negative for leg swelling, orthopnea, paroxysmal nocturnal dyspnea and syncope.  Endocrine: Negative for cold intolerance.  Hematologic/Lymphatic: Does not bruise/bleed easily.  Musculoskeletal: Positive for muscle cramps (occasional leg cramps with activity). Negative for joint swelling.  Gastrointestinal: Negative for abdominal pain, anorexia, change in bowel habit, hematochezia and melena.  Neurological: Negative for headaches and light-headedness.  Psychiatric/Behavioral: Negative for depression and substance abuse.  All other systems reviewed and are negative.  Objective   Vitals with BMI 07/19/2019 06/24/2019 04/12/2019  Height '5\' 8"'  '5\' 8"'  '5\' 8"'   Weight 235 lbs 231 lbs 8 oz 243 lbs 3 oz  BMI 35.74 71.69 67.89  Systolic 381 017 510  Diastolic 73 69 70  Pulse 92 90 82     Physical Exam  Constitutional:  Well-built, moderately obese, no acute distress.  HENT:  Head: Atraumatic.  Eyes: Conjunctivae are normal.  Neck: Neck supple. No thyromegaly present.  Short neck and difficult to evaluate JVP  Cardiovascular: Normal rate, regular rhythm and normal heart sounds. Exam reveals no gallop.  No murmur heard. Pulses:      Carotid pulses are 2+ on the right side and 2+ on the left side.      Dorsalis pedis pulses are 1+ on the right side and 1+ on the left side.       Posterior tibial pulses are 1+ on the right side and 1+ on the left side.  Femoral and popliteal pulse difficult to feel due to patient's body habitus.   Pulmonary/Chest: Effort normal and breath sounds normal.  Abdominal: Soft. Bowel sounds are normal.  Obese.t  Musculoskeletal: Normal range of motion.        General: No edema.  Neurological: He is alert.  Skin: Skin is warm and dry.  Psychiatric: He has a normal mood and affect.   Laboratory examination:   CBC And DifferentialResulted: 07/28/2018 5:36 AM [facility] Component Name Value Ref Range  WBC 6.2 3.4 - 10.8 x10E3/uL  RBC 4.92 4.14 - 5.8 x10E6/uL  Hemoglobin 14.8 13 - 17.7 g/dL  Hematocrit 44.3 37.5 - 51 %  MCV 90 79 - 97 fL  MCH 30.1 26.6 - 33 pg  MCHC 33.4 31.5 - 35.7 g/dL  RDW 12.3    Comprehensive Metabolic PanelResulted: 25/85/2778 5:36 AM [facility] Component Name Value Ref Range  Glucose 108 (H) 65 - 99 mg/dL  BUN 20 8 - 27 mg/dL  Creatinine, Serum 0.89 0.76 - 1.27 mg/dL  eGFR If NonAfrican American 90 >59 mL/min/1.73  eGFR If African American 104 >59 mL/min/1.73  BUN/Creatinine Ratio 22 10 - 24   Sodium 142 134 - 144 mmol/L  Potassium 4.6 3.5 - 5.2 mmol/L  Chloride 101 96 - 106 mmol/L  CO2 24 20 - 29 mmol/L  CALCIUM 9.7 8.6 - 10.2 mg/dL  Total Protein 7.1 6 - 8.5 g/dL  Albumin, Serum 4.6 3.6 - 4.8 g/dL  Globulin, Total 2.5 1.5 - 4.5 g/dL  Albumin/Globulin Ratio 1.8 1.2 - 2.2   Total Bilirubin 0.5 0  - 1.2 mg/dL  Alkaline Phosphatase 94 39 - 117 IU/L  AST 19 0 - 40 IU/L  ALT (SGPT) 29 0 - 44 IU/L   Lipid PanelResulted: 07/28/2018 5:36 AM [facility] Component Name Value Ref Range  Cholesterol, Total 121 100 - 199 mg/dL  Triglycerides 101 0 - 149 mg/dL  HDL 41 >39 mg/dL  VLDL Cholesterol Cal 20 5 - 40 mg/dL  LDL 60 0 - 99 mg/dL   TSHLast Updated: 01/12/2016 [facility] Component Name Value Ref Range  TSH 1.660 0.450 - 4.500 uIU/mL    Recent Labs    07/05/19 0751  CREATININE 0.77  GFRNONAA >60  GFRAA >60   CMP Latest Ref Rng & Units 07/05/2019  Creatinine 0.61 - 1.24 mg/dL 0.77   No flowsheet data found. Lipid Panel  No results found for: CHOL, TRIG, HDL, CHOLHDL, VLDL, LDLCALC, LDLDIRECT HEMOGLOBIN A1C No results found for: HGBA1C, MPG TSH No results for input(s): TSH in the last 8760 hours. Medications and allergies   Allergies  Allergen Reactions  . Doxycycline Itching     Prior to Admission medications   Medication Sig Start Date End Date Taking? Authorizing Provider  albuterol (VENTOLIN HFA) 108 (90 Base) MCG/ACT inhaler TAKE 2 PUFFS BY MOUTH EVERY 6 HOURS AS NEEDED FOR WHEEZE OR SHORTNESS OF BREATH 06/14/19   Terald Sleeper, PA-C  amLODipine (NORVASC) 5 MG tablet TAKE ONE TABLET (5 MG DOSE) BY MOUTH DAILY. 04/12/19   Terald Sleeper, PA-C  aspirin 81 MG chewable tablet Chew by mouth daily.    [provider]  atorvastatin (LIPITOR) 10 MG tablet TAKE 1 TABLET BY MOUTH EVERYDAY AT BEDTIME 04/12/19   Terald Sleeper, PA-C  busPIRone (BUSPAR) 10 MG tablet TAKE 1 TABLET (10 MG TOTAL) BY MOUTH 3 (THREE) TIMES DAILY. FOR ANXIETY 05/06/19   Terald Sleeper, PA-C  escitalopram (LEXAPRO) 20 MG tablet Take 1 tablet (20 mg total) by mouth daily. 04/12/19   Terald Sleeper, PA-C  Omega-3 Fatty Acids (FISH OIL) 1000 MG CAPS Take by mouth.    [provider]     Current Outpatient Medications  Medication Instructions  . albuterol (VENTOLIN HFA) 108 (90  Base) MCG/ACT inhaler TAKE 2 PUFFS BY MOUTH EVERY 6 HOURS AS NEEDED FOR WHEEZE OR SHORTNESS OF BREATH  . amLODipine (NORVASC) 5 MG tablet TAKE ONE TABLET (5 MG DOSE) BY MOUTH DAILY.  [name] aspirin 81 MG chewable tablet Oral, Daily  . atorvastatin (LIPITOR) 10 MG tablet TAKE 1 TABLET BY MOUTH EVERYDAY AT BEDTIME  . busPIRone (BUSPAR) 10 mg, Oral, 3 times daily, For anxiety  . cholecalciferol (VITAMIN D3) 2,000 Units, Oral, Daily  . escitalopram (LEXAPRO) 20 mg, Oral, Daily  . furosemide (LASIX) 20 mg, Oral, Daily PRN  . losartan-hydrochlorothiazide (HYZAAR) 50-12.5 MG tablet 1 tablet, Oral, Daily  . Omega-3 Fatty Acids (FISH OIL) 1000 MG CAPS Oral  . potassium chloride (KLOR-CON) 10 MEQ tablet 10 mEq, Oral, Daily PRN    Radiology:  No results found.  Cardiac Studies:   Echocardiogram 05/27/2019:  Normal LV size and function, EF 55%.  Severe LVH.  Increased echogenicity of myocardium, strongly recommended investigation for infiltrative diseases. Normal RV size and function.  Moderate mitral regurgitation, moderate to severe tricuspid regurgitation, severe pulmonary hypertension.  PA systolic pressure 67 mmHg, RV pressure 7 mmHg.  Lexiscan Tetrofosmin stress test 07/12/2019: No previous exam available for comparison. Myocardial perfusion imaging is normal. Left ventricular ejection fraction is 50% with normal wall motion. Low risk study.  Cardiac MR with and without contrast 07/06/2019: 1. Mildly dilated left ventricle with moderate concentric left ventricular hypertrophy and normal left ventricular systolic function (LVEF = 60%). There are no regional wall motion abnormalities. These findings are not consistent with an infiltrative cardiomyopathy. T1 mapping showed normal extracellular volume (ECV) of 22% (normal < 25%). 2.  Normal RV thickness and systolic function. 3.  Moderately dilated left and right atrial size. 4.  Normal aortic root, ascending  aorta and pulmonary artery.   5.   Moderate mitral and tricuspid regurgitation. 6.  Normal pericardium.  Assessment     ICD-10-CM   1. Hypertensive heart disease with chronic diastolic congestive heart failure (HCC)  I11.0 losartan-hydrochlorothiazide (HYZAAR) 50-12.5 MG tablet   I50.32 potassium chloride (KLOR-CON) 10 MEQ tablet    furosemide (LASIX) 20 MG tablet  2. Essential hypertension  I10 losartan-hydrochlorothiazide (HYZAAR) 50-12.5 MG tablet    Basic Metabolic Panel (BMET)  3. Dyspnea on exertion  R06.00 potassium chloride (KLOR-CON) 10 MEQ tablet    furosemide (LASIX) 20 MG tablet    EKG 06/24/2019: Normal sinus rhythm at the rate of 80 bpm, rightward axis, right bundle branch block.  Low-voltage complexes.  No evidence of ischemia.  No significant change from  05/26/2019  Recommendations:   Derek Ryan  is a 63 y.o. Caucasian male with hypertension, hyperlipidemia, hyperglycemia, anxiety and depression, admitted to [facility], [place], [place] with bilateral covid 19 pneumonia on 05/24/2019 and discharged 6 days later.  He was seen by me on 06/24/2019. He presented with symptoms of acute diastolic heart failure and I had started him on furosemide along with potassium supplements, underwent extensive evaluation including cardiac MR and nuclear stress test and presents for follow-up.  His echocardiogram performed in the outside hospital had revealed severe LVH and speckled pattern.  Blood pressure is well controlled, lipids are well controlled. I also reviewed the cardiac MR and reassured the patient, fortunately he has not had any cardiac involvement from recent Covid 19 pneumonia.  Dyspnea has improved, he is not in any acute diastolic heart failure.  Advised him to use furosemide and also potassium supplements on a p.r.n. basis, I have added losartan HCT 50/12.5 mg for hypertension and also chronic diastolic heart failure.  I will like to see him back in 6 months or sooner if problems, weight loss was extensively  discussed.  I also discussed his findings with his fiance over the telephone.  Adrian Prows, MD, [facility] ([facility]) 07/19/2019, 11:02 PM [facility] [place] Pager: [phone] Office: [phone] If no answer Cell [phone]

## 2019-07-23 ENCOUNTER — Other Ambulatory Visit: Payer: Self-pay

## 2019-07-26 ENCOUNTER — Ambulatory Visit (INDEPENDENT_AMBULATORY_CARE_PROVIDER_SITE_OTHER): Payer: Medicare Other | Admitting: Physician Assistant

## 2019-07-26 ENCOUNTER — Encounter: Payer: Self-pay | Admitting: Physician Assistant

## 2019-07-26 VITALS — BP 114/65 | HR 68 | Temp 96.8°F | Ht 68.0 in | Wt 235.8 lb

## 2019-07-26 DIAGNOSIS — Z23 Encounter for immunization: Secondary | ICD-10-CM | POA: Diagnosis not present

## 2019-07-26 DIAGNOSIS — E7800 Pure hypercholesterolemia, unspecified: Secondary | ICD-10-CM | POA: Diagnosis not present

## 2019-07-26 DIAGNOSIS — K219 Gastro-esophageal reflux disease without esophagitis: Secondary | ICD-10-CM

## 2019-07-26 DIAGNOSIS — Z0000 Encounter for general adult medical examination without abnormal findings: Secondary | ICD-10-CM

## 2019-07-26 DIAGNOSIS — Z1159 Encounter for screening for other viral diseases: Secondary | ICD-10-CM | POA: Diagnosis not present

## 2019-07-26 DIAGNOSIS — I10 Essential (primary) hypertension: Secondary | ICD-10-CM | POA: Diagnosis not present

## 2019-07-26 LAB — BAYER DCA HB A1C WAIVED: HB A1C (BAYER DCA - WAIVED): 6.5 % (ref <7.0)

## 2019-07-27 LAB — TSH: TSH: 1.29 u[IU]/mL (ref 0.450–4.500)

## 2019-07-27 LAB — HEPATITIS C ANTIBODY: Hep C Virus Ab: <0.1 s/co ratio (ref 0.0–0.9)

## 2019-07-27 LAB — PSA: Prostate Specific Ag, Serum: 1.3 ng/mL (ref 0.0–4.0)

## 2019-07-28 ENCOUNTER — Other Ambulatory Visit: Payer: Self-pay | Admitting: *Deleted

## 2019-07-28 ENCOUNTER — Other Ambulatory Visit: Payer: Self-pay | Admitting: Physician Assistant

## 2019-07-28 DIAGNOSIS — F411 Generalized anxiety disorder: Secondary | ICD-10-CM

## 2019-07-28 MED ORDER — METFORMIN HCL 500 MG PO TABS: 500.0000 mg | ORAL_TABLET | Freq: Every day | ORAL | 1 refills | Status: DC

## 2019-07-29 NOTE — Progress Notes (Signed)
BP 114/65   Pulse 68   Temp (!) 96.8 F (36 C) (Temporal)   Ht 5\' 8"  (1.727 m)   Wt 235 lb 12.8 oz (107 kg)   SpO2 98%   BMI 35.85 kg/m    Subjective:    Patient ID: Derek Ryan, male    DOB: March 11, 1956, 63 y.o.   MRN: [id number]  HPI: Derek Ryan is a 63 y.o. male presenting on 07/26/2019 for Medical Management of Chronic Issues (3 mt, HAD COVID19 FIRST WEEK OFF OCTOBER)  This patient is having now for a month follow-up on his pain medication and conditions.  He does have hypertension, GERD, hyperlipidemia.  Overall he states that he is doing fairly well not having any difficulty with his medications.  We will have labs performed today.  He has had slightly elevated sugars in the past so we are watching for the onset of diabetes.  Reports that overall things are doing well and he does not have any other complaints at this time.  Past Medical History:  Diagnosis Date  . Depression   . GERD (gastroesophageal reflux disease)   . Hyperlipidemia   . Hypertension    Relevant past medical, surgical, family and social history reviewed and updated as indicated. Interim medical history since our last visit reviewed. Allergies and medications reviewed and updated. DATA REVIEWED: CHART IN EPIC  Family History reviewed for pertinent findings.  Review of Systems  Constitutional: Negative.  Negative for appetite change and fatigue.  HENT: Negative.   Eyes: Negative.  Negative for pain and visual disturbance.  Respiratory: Negative.  Negative for cough, chest tightness, shortness of breath and wheezing.   Cardiovascular: Negative.  Negative for chest pain, palpitations and leg swelling.  Gastrointestinal: Negative.  Negative for abdominal pain, diarrhea, nausea and vomiting.  Endocrine: Negative.   Genitourinary: Negative.   Musculoskeletal: Negative.   Skin: Negative.  Negative for color change and rash.  Neurological: Negative.  Negative for weakness, numbness and headaches.   Psychiatric/Behavioral: Negative.     Allergies as of 07/26/2019      Reactions   Doxycycline Itching      Medication List       Accurate as of July 26, 2019 11:59 PM. If you have any questions, ask your nurse or doctor.        STOP taking these medications   amLODipine 5 MG tablet Commonly known as: NORVASC Stopped by: Terald Sleeper, PA-C   furosemide 20 MG tablet Commonly known as: LASIX Stopped by: Terald Sleeper, PA-C     TAKE these medications   albuterol 108 (90 Base) MCG/ACT inhaler Commonly known as: VENTOLIN HFA TAKE 2 PUFFS BY MOUTH EVERY 6 HOURS AS NEEDED FOR WHEEZE OR SHORTNESS OF BREATH   aspirin 81 MG chewable tablet Chew by mouth daily.   atorvastatin 10 MG tablet Commonly known as: LIPITOR TAKE 1 TABLET BY MOUTH EVERYDAY AT BEDTIME   busPIRone 10 MG tablet Commonly known as: BUSPAR TAKE 1 TABLET (10 MG TOTAL) BY MOUTH 3 (THREE) TIMES DAILY. FOR ANXIETY What changed: when to take this   cholecalciferol 25 MCG (1000 UT) tablet Commonly known as: VITAMIN D3 Take 2,000 Units by mouth daily.   escitalopram 20 MG tablet Commonly known as: LEXAPRO Take 1 tablet (20 mg total) by mouth daily.   Fish Oil 1000 MG Caps Take by mouth.   losartan-hydrochlorothiazide 50-12.5 MG tablet Commonly known as: Hyzaar Take 1 tablet by mouth daily.  potassium chloride 10 MEQ tablet Commonly known as: KLOR-CON Take 1 tablet (10 mEq total) by mouth daily as needed (For fluid gain and marked shortness of breath with furosemide).   vitamin C 500 MG tablet Commonly known as: ASCORBIC ACID Take 500 mg by mouth daily.   zinc sulfate 220 (50 Zn) MG capsule Take 220 mg by mouth daily.          Objective:    BP 114/65   Pulse 68   Temp (!) 96.8 F (36 C) (Temporal)   Ht 5\' 8"  (1.727 m)   Wt 235 lb 12.8 oz (107 kg)   SpO2 98%   BMI 35.85 kg/m   Allergies  Allergen Reactions  . Doxycycline Itching    Wt Readings from Last 3 Encounters:  07/26/19  235 lb 12.8 oz (107 kg)  07/19/19 235 lb (106.6 kg)  06/24/19 231 lb 8 oz (105 kg)    Physical Exam Vitals and nursing note reviewed.  Constitutional:      General: He is not in acute distress.    Appearance: He is well-developed.  HENT:     Head: Normocephalic and atraumatic.  Eyes:     Conjunctiva/sclera: Conjunctivae normal.     Pupils: Pupils are equal, round, and reactive to light.  Cardiovascular:     Rate and Rhythm: Normal rate and regular rhythm.     Heart sounds: Normal heart sounds.  Pulmonary:     Effort: Pulmonary effort is normal. No respiratory distress.     Breath sounds: Normal breath sounds.  Skin:    General: Skin is warm and dry.  Psychiatric:        Behavior: Behavior normal.     Results for orders placed or performed in visit on 07/26/19  PSA  Result Value Ref Range   Prostate Specific Ag, Serum 1.3 0.0 - 4.0 ng/mL  TSH  Result Value Ref Range   TSH 1.290 0.450 - 4.500 uIU/mL  Hepatitis C antibody  Result Value Ref Range   Hep C Virus Ab <0.1 0.0 - 0.9 s/co ratio  hgba1c  Result Value Ref Range   HB A1C (BAYER DCA - WAIVED) 6.5 <7.0 %      Assessment & Plan:    1. Essential hypertension Continue medications  2. Encounter for hepatitis C screening test for low risk patient - Hepatitis C antibody  3. Pure hypercholesterolemia Continue medications  4. Gastroesophageal reflux disease without esophagitis Continue medications - TSH  5. Well adult exam - PSA - TSH - hgba1c   Continue all other maintenance medications as listed above.  Follow up plan: Return in about 6 months (around 01/24/2020).  Educational handout given for [place] PA-C Vian [address]  [place], [place] [address] [phone]   07/29/2019, 10:46 AM

## 2019-07-30 ENCOUNTER — Other Ambulatory Visit: Payer: Self-pay

## 2019-07-30 ENCOUNTER — Other Ambulatory Visit: Payer: Medicare Other

## 2019-07-31 LAB — BASIC METABOLIC PANEL
BUN/Creatinine Ratio: 19 (ref 10–24)
BUN: 15 mg/dL (ref 8–27)
CO2: 26 mmol/L (ref 20–29)
Calcium: 9.7 mg/dL (ref 8.6–10.2)
Chloride: 99 mmol/L (ref 96–106)
Creatinine, Ser: 0.81 mg/dL (ref 0.76–1.27)
GFR calc Af Amer: 108 mL/min/{1.73_m2} (ref >59)
GFR calc non Af Amer: 93 mL/min/{1.73_m2} (ref >59)
Glucose: 130 mg/dL — ABNORMAL HIGH (ref 65–99)
Potassium: 4.2 mmol/L (ref 3.5–5.2)
Sodium: 141 mmol/L (ref 134–144)

## 2019-08-11 ENCOUNTER — Telehealth: Payer: Self-pay | Admitting: Physician Assistant

## 2019-08-11 NOTE — Telephone Encounter (Signed)
Patient aware if we get prior approval it will get worked on

## 2019-08-24 ENCOUNTER — Ambulatory Visit (INDEPENDENT_AMBULATORY_CARE_PROVIDER_SITE_OTHER): Payer: Medicare Other | Admitting: Family Medicine

## 2019-08-24 ENCOUNTER — Other Ambulatory Visit: Payer: Self-pay

## 2019-08-24 VITALS — BP 115/71 | HR 105 | Temp 97.5°F

## 2019-08-24 DIAGNOSIS — R0683 Snoring: Secondary | ICD-10-CM

## 2019-08-24 DIAGNOSIS — R030 Elevated blood-pressure reading, without diagnosis of hypertension: Secondary | ICD-10-CM | POA: Diagnosis not present

## 2019-08-24 NOTE — Patient Instructions (Signed)
Sleep Apnea Sleep apnea is a condition in which breathing pauses or becomes shallow during sleep. Episodes of sleep apnea usually last 10 seconds or longer, and they may occur as many as 20 times an hour. Sleep apnea disrupts your sleep and keeps your body from getting the rest that it needs. This condition can increase your risk of certain health problems, including:  Heart attack.  Stroke.  Obesity.  Diabetes.  Heart failure.  Irregular heartbeat. What are the causes? There are three kinds of sleep apnea:  Obstructive sleep apnea. This kind is caused by a blocked or collapsed airway.  Central sleep apnea. This kind happens when the part of the brain that controls breathing does not send the correct signals to the muscles that control breathing.  Mixed sleep apnea. This is a combination of obstructive and central sleep apnea. The most common cause of this condition is a collapsed or blocked airway. An airway can collapse or become blocked if:  Your throat muscles are abnormally relaxed.  Your tongue and tonsils are larger than normal.  You are overweight.  Your airway is smaller than normal. What increases the risk? You are more likely to develop this condition if you:  Are overweight.  Smoke.  Have a smaller than normal airway.  Are elderly.  Are male.  Drink alcohol.  Take sedatives or tranquilizers.  Have a family history of sleep apnea. What are the signs or symptoms? Symptoms of this condition include:  Trouble staying asleep.  Daytime sleepiness and tiredness.  Irritability.  Loud snoring.  Morning headaches.  Trouble concentrating.  Forgetfulness.  Decreased interest in sex.  Unexplained sleepiness.  Mood swings.  Personality changes.  Feelings of depression.  Waking up often during the night to urinate.  Dry mouth.  Sore throat. How is this diagnosed? This condition may be diagnosed with:  A medical history.  A physical  exam.  A series of tests that are done while you are sleeping (sleep study). These tests are usually done in a sleep lab, but they may also be done at home. How is this treated? Treatment for this condition aims to restore normal breathing and to ease symptoms during sleep. It may involve managing health issues that can affect breathing, such as high blood pressure or obesity. Treatment may include:  Sleeping on your side.  Using a decongestant if you have nasal congestion.  Avoiding the use of depressants, including alcohol, sedatives, and narcotics.  Losing weight if you are overweight.  Making changes to your diet.  Quitting smoking.  Using a device to open your airway while you sleep, such as: ? An oral appliance. This is a custom-made mouthpiece that shifts your lower jaw forward. ? A continuous positive airway pressure (CPAP) device. This device blows air through a mask when you breathe out (exhale). ? A nasal expiratory positive airway pressure (EPAP) device. This device has valves that you put into each nostril. ? A bi-level positive airway pressure (BPAP) device. This device blows air through a mask when you breathe in (inhale) and breathe out (exhale).  Having surgery if other treatments do not work. During surgery, excess tissue is removed to create a wider airway. It is important to get treatment for sleep apnea. Without treatment, this condition can lead to:  High blood pressure.  Coronary artery disease.  In men, an inability to achieve or maintain an erection (impotence).  Reduced thinking abilities. Follow these instructions at home: Lifestyle  Make any lifestyle changes   that your health care provider recommends.  Eat a healthy, well-balanced diet.  Take steps to lose weight if you are overweight.  Avoid using depressants, including alcohol, sedatives, and narcotics.  Do not use any products that contain nicotine or tobacco, such as cigarettes,  e-cigarettes, and chewing tobacco. If you need help quitting, ask your health care provider. General instructions  Take over-the-counter and prescription medicines only as told by your health care provider.  If you were given a device to open your airway while you sleep, use it only as told by your health care provider.  If you are having surgery, make sure to tell your health care provider you have sleep apnea. You may need to bring your device with you.  Keep all follow-up visits as told by your health care provider. This is important. Contact a health care provider if:  The device that you received to open your airway during sleep is uncomfortable or does not seem to be working.  Your symptoms do not improve.  Your symptoms get worse. Get help right away if:  You develop: ? Chest pain. ? Shortness of breath. ? Discomfort in your back, arms, or stomach.  You have: ? Trouble speaking. ? Weakness on one side of your body. ? Drooping in your face. These symptoms may represent a serious problem that is an emergency. Do not wait to see if the symptoms will go away. Get medical help right away. Call your local emergency services (911 in the [place] Do not drive yourself to the hospital. Summary  Sleep apnea is a condition in which breathing pauses or becomes shallow during sleep.  The most common cause is a collapsed or blocked airway.  The goal of treatment is to restore normal breathing and to ease symptoms during sleep. This information is not intended to replace advice given to you by your health care provider. Make sure you discuss any questions you have with your health care provider. Document Revised: 01/20/2019 Document Reviewed: 03/31/2018 Elsevier Patient Education  2020 [employer]vier Inc.  

## 2019-08-24 NOTE — Progress Notes (Signed)
Telephone visit  Subjective: CC: HTN PCP: Terald Sleeper, PA-C [phone] Derek Ryan is a 64 y.o. male calls for telephone consult today. Patient provides verbal consent for consult held via phone.  Due to COVID-19 pandemic this visit was conducted virtually. This visit type was conducted due to national recommendations for restrictions regarding the COVID-19 Pandemic (e.g. social distancing, sheltering in place) in an effort to limit this patient's exposure and mitigate transmission in our community. All issues noted in this document were discussed and addressed.  A physical exam was not performed with this format.   Location of patient: home Location of provider: Working remotely from home Others present for call: none  1.  Elevated blood pressure reading Patient reports that he woke up in the middle of the night in a panic feeling shortness of breath.  He had an associated headache which has since resolved.  He measured his blood pressure which was 176 over 90s, pulse was 130 and oxygen level was 93%.  He notes that his symptoms have resolved and that he is compliant with his anxiety medicines.  He does admit to snoring.  He knows that he is overweight.  He had an evaluation with his cardiologist within the last couple of months who felt that overall he was in good shape with the exception of mild thickening of the cardiac wall.  He has never been evaluated for sleep apnea but notes that he does snore.   ROS: Per HPI  Allergies  Allergen Reactions  . Doxycycline Itching   Past Medical History:  Diagnosis Date  . Depression   . GERD (gastroesophageal reflux disease)   . Hyperlipidemia   . Hypertension     Current Outpatient Medications:  .  albuterol (VENTOLIN HFA) 108 (90 Base) MCG/ACT inhaler, TAKE 2 PUFFS BY MOUTH EVERY 6 HOURS AS NEEDED FOR WHEEZE OR SHORTNESS OF BREATH (Patient not taking: Reported on 07/19/2019), Disp: 18 g, Rfl: 1 .  aspirin 81 MG chewable tablet, Chew by mouth  daily., Disp: , Rfl:  .  atorvastatin (LIPITOR) 10 MG tablet, TAKE 1 TABLET BY MOUTH EVERYDAY AT BEDTIME, Disp: 90 tablet, Rfl: 3 .  busPIRone (BUSPAR) 10 MG tablet, TAKE 1 TABLET (10 MG TOTAL) BY MOUTH 3 (THREE) TIMES DAILY. FOR ANXIETY, Disp: 270 tablet, Rfl: 3 .  cholecalciferol (VITAMIN D3) 25 MCG (1000 UT) tablet, Take 2,000 Units by mouth daily., Disp: , Rfl:  .  escitalopram (LEXAPRO) 20 MG tablet, Take 1 tablet (20 mg total) by mouth daily., Disp: 90 tablet, Rfl: 3 .  losartan-hydrochlorothiazide (HYZAAR) 50-12.5 MG tablet, Take 1 tablet by mouth daily., Disp: 90 tablet, Rfl: 2 .  metFORMIN (GLUCOPHAGE) 500 MG tablet, Take 1 tablet (500 mg total) by mouth daily with breakfast., Disp: 90 tablet, Rfl: 1 .  Omega-3 Fatty Acids (FISH OIL) 1000 MG CAPS, Take by mouth., Disp: , Rfl:  .  potassium chloride (KLOR-CON) 10 MEQ tablet, Take 1 tablet (10 mEq total) by mouth daily as needed (For fluid gain and marked shortness of breath with furosemide). (Patient not taking: Reported on 07/26/2019), Disp: 30 tablet, Rfl: 0 .  vitamin C (ASCORBIC ACID) 500 MG tablet, Take 500 mg by mouth daily., Disp: , Rfl:  .  zinc sulfate 220 (50 Zn) MG capsule, Take 220 mg by mouth daily., Disp: , Rfl:   Vitals:   08/24/19 1417  BP: 115/71  Pulse: (!) 105  Temp: (!) 97.5 F (36.4 C)  SpO2: 95%  Assessment/ Plan: 64 y.o. male   1. Snoring I wonder if his symptoms were reflective of an apneic episode during sleep.  I am going to place a referral to neurology for formal sleep study and evaluation.  Likely his blood pressure has returned to normal.  His oxygen level is within normal range.  His pulse is still slightly above what I would expect but he has not yet rechecked this.  I have asked him to check it again as normal would be less than 100.  I have also asked him to reach out to his cardiologist for possible repeat EKG if possible.  He is currently asymptomatic.  We discussed red flag signs and symptoms  warranting further evaluation in the emergency department. - Ambulatory referral to Neurology  2. Elevated blood pressure reading Resolved   Start time: 2:06pm End time: 2:19pm  Total time spent on patient care (including telephone call/ virtual visit): 20 minutes  [place], [place] [phone]

## 2019-08-25 ENCOUNTER — Other Ambulatory Visit: Payer: Self-pay

## 2019-08-25 ENCOUNTER — Ambulatory Visit: Payer: Medicare Other | Admitting: Cardiology

## 2019-08-25 ENCOUNTER — Encounter: Payer: Self-pay | Admitting: Cardiology

## 2019-08-25 VITALS — BP 123/64 | HR 68 | Temp 98.7°F | Ht 68.0 in | Wt 238.5 lb

## 2019-08-25 DIAGNOSIS — I110 Hypertensive heart disease with heart failure: Secondary | ICD-10-CM | POA: Diagnosis not present

## 2019-08-25 DIAGNOSIS — I5032 Chronic diastolic (congestive) heart failure: Secondary | ICD-10-CM

## 2019-08-25 DIAGNOSIS — R0602 Shortness of breath: Secondary | ICD-10-CM | POA: Diagnosis not present

## 2019-08-25 DIAGNOSIS — I119 Hypertensive heart disease without heart failure: Secondary | ICD-10-CM

## 2019-08-25 HISTORY — DX: Hypertensive heart disease without heart failure: I11.9

## 2019-08-25 HISTORY — DX: Chronic diastolic (congestive) heart failure: I50.32

## 2019-08-25 NOTE — Progress Notes (Signed)
Primary Physician/Referring:  Terald Sleeper, PA-C  Patient ID: Derek Ryan, male    DOB: [birth year], 64 y.o.   MRN: [id number]  Chief Complaint  Patient presents with  . Shortness of Breath    pt c/o tachycardia and SOB  . Tachycardia   HPI:    Derek Ryan  is a 64 y.o.  Caucasian male with hypertension, hyperlipidemia, hyperglycemia, anxiety and depression, admitted to [facility], [place], [place] with bilateral covid 19 pneumonia on 05/24/2019 and discharged 6 days later, Had markedly abnormal echocardiogram revealing speckled pattern and severe LVH.  However I performed extensive evaluation including stress testing and MRI which is nonischemic and essentially showing mild to moderate LVH without infiltrate or heart disease. He does not smoke, quit smoking remotely and does not drink alcohol.  He is being treated for chronic diastolic heart failure, with furosemide symptoms had improved, he was started on losartan HCT on his last office visit 3 weeks ago, was supposed to see me in 6 months however patient an appointment stating that he is having worsening dyspnea and fatigue.  He is been evaluated by his PCP and recommended sleep study.   States that he is also woke up from sleep with rapid palpitations, heart rate was recorded to be 130 bpm and blood pressure was elevated at around 160 mmHg.  States that he may be having a panic attack as well as he has known history of anxiety.  He does admit to loud snoring as well and continued fatigue.  Past Medical History:  Diagnosis Date  . Chronic diastolic heart failure ([place]) 08/25/2019  . Depression   . GERD (gastroesophageal reflux disease)   . Hyperlipidemia   . Hypertension   . LVH (left ventricular hypertrophy) due to hypertensive disease 08/25/2019   History reviewed. No pertinent surgical history. Social History   Socioeconomic History  . Marital status: Widowed    Spouse name: Not on file  . Number of children: 3  . Years  of education: Not on file  . Highest education level: Not on file  Occupational History  . Not on file  Tobacco Use  . Smoking status: Former Smoker    Packs/day: 1.00    Years: 23.00    Pack years: 23.00    Types: Cigarettes    Quit date: 1992    Years since quitting: 29.0  . Smokeless tobacco: Never Used  Substance and Sexual Activity  . Alcohol use: Not Currently    Comment: stopped in 1997  . Drug use: Never  . Sexual activity: Not on file  Other Topics Concern  . Not on file  Social History Narrative  . Not on file   Social Determinants of Health   Financial Resource Strain:   . Difficulty of Paying Living Expenses: Not on file  Food Insecurity:   . Worried About Charity fundraiser in the Last Year: Not on file  . Ran Out of Food in the Last Year: Not on file  Transportation Needs:   . Lack of Transportation (Medical): Not on file  . Lack of Transportation (Non-Medical): Not on file  Physical Activity:   . Days of Exercise per Week: Not on file  . Minutes of Exercise per Session: Not on file  Stress:   . Feeling of Stress : Not on file  Social Connections:   . Frequency of Communication with Friends and Family: Not on file  . Frequency of Social Gatherings with Friends and  Family: Not on file  . Attends Religious Services: Not on file  . Active Member of Clubs or Organizations: Not on file  . Attends Archivist Meetings: Not on file  . Marital Status: Not on file  Intimate Partner Violence:   . Fear of Current or Ex-Partner: Not on file  . Emotionally Abused: Not on file  . Physically Abused: Not on file  . Sexually Abused: Not on file   ROS  Review of Systems  Constitution: Positive for malaise/fatigue. Negative for chills, decreased appetite and weight gain.  Cardiovascular: Positive for dyspnea on exertion. Negative for leg swelling, orthopnea, paroxysmal nocturnal dyspnea and syncope.  Respiratory: Positive for sleep disturbances due to  breathing and snoring.   Endocrine: Negative for cold intolerance.  Hematologic/Lymphatic: Does not bruise/bleed easily.  Musculoskeletal: Positive for muscle cramps (occasional leg cramps with activity). Negative for joint swelling.  Gastrointestinal: Negative for abdominal pain, anorexia, change in bowel habit, hematochezia and melena.  Neurological: Negative for headaches and light-headedness.  Psychiatric/Behavioral: Negative for depression and substance abuse.  All other systems reviewed and are negative.  Objective   Vitals with BMI 08/25/2019 08/24/2019 07/26/2019  Height '5\' 8"'  - '5\' 8"'   Weight 238 lbs 8 oz - 235 lbs 13 oz  BMI 83.38 - 25.05  Systolic 397 673 419  Diastolic 64 71 65  Pulse 68 105 68   Blood pressure 123/64, pulse 68, temperature 98.7 F (37.1 C), height '5\' 8"'  (1.727 m), weight 238 lb 8 oz (108.2 kg), SpO2 97 %.   Physical Exam  Constitutional:  Well-built, moderately obese, no acute distress.  HENT:  Head: Atraumatic.  Eyes: Conjunctivae are normal.  Neck: No thyromegaly present.  Short neck and difficult to evaluate JVP  Cardiovascular: Normal rate, regular rhythm and normal heart sounds. Exam reveals no gallop.  No murmur heard. Pulses:      Carotid pulses are 2+ on the right side and 2+ on the left side.      Dorsalis pedis pulses are 1+ on the right side and 1+ on the left side.       Posterior tibial pulses are 1+ on the right side and 1+ on the left side.  Femoral and popliteal pulse difficult to feel due to patient's body habitus.   Pulmonary/Chest: Effort normal and breath sounds normal.  Abdominal: Soft. Bowel sounds are normal.  Obese.t  Musculoskeletal:        General: No edema. Normal range of motion.     Cervical back: Neck supple.  Neurological: He is alert.  Skin: Skin is warm and dry.  Psychiatric: He has a normal mood and affect.   Laboratory examination:   CBC And DifferentialResulted: 07/28/2018 5:36 AM [facility] Component  Name Value Ref Range  WBC 6.2 3.4 - 10.8 x10E3/uL  RBC 4.92 4.14 - 5.8 x10E6/uL  Hemoglobin 14.8 13 - 17.7 g/dL  Hematocrit 44.3 37.5 - 51 %  MCV 90 79 - 97 fL  MCH 30.1 26.6 - 33 pg  MCHC 33.4 31.5 - 35.7 g/dL  RDW 12.3    Comprehensive Metabolic PanelResulted: 37/90/2409 5:36 AM [facility] Component Name Value Ref Range  Glucose 108 (H) 65 - 99 mg/dL  BUN 20 8 - 27 mg/dL  Creatinine, Serum 0.89 0.76 - 1.27 mg/dL  eGFR If NonAfrican American 90 >59 mL/min/1.73  eGFR If African American 104 >59 mL/min/1.73  BUN/Creatinine Ratio 22 10 - 24   Sodium 142 134 - 144 mmol/L  Potassium  4.6 3.5 - 5.2 mmol/L  Chloride 101 96 - 106 mmol/L  CO2 24 20 - 29 mmol/L  CALCIUM 9.7 8.6 - 10.2 mg/dL  Total Protein 7.1 6 - 8.5 g/dL  Albumin, Serum 4.6 3.6 - 4.8 g/dL  Globulin, Total 2.5 1.5 - 4.5 g/dL  Albumin/Globulin Ratio 1.8 1.2 - 2.2   Total Bilirubin 0.5 0 - 1.2 mg/dL  Alkaline Phosphatase 94 39 - 117 IU/L  AST 19 0 - 40 IU/L  ALT (SGPT) 29 0 - 44 IU/L   Lipid PanelResulted: 07/28/2018 5:36 AM [facility] Component Name Value Ref Range  Cholesterol, Total 121 100 - 199 mg/dL  Triglycerides 101 0 - 149 mg/dL  HDL 41 >39 mg/dL  VLDL Cholesterol Cal 20 5 - 40 mg/dL  LDL 60 0 - 99 mg/dL   TSHLast Updated: 01/12/2016 [facility] Component Name Value Ref Range  TSH 1.660 0.450 - 4.500 uIU/mL    Recent Labs    07/05/19 0751 07/30/19 0823  NA  --  141  K  --  4.2  CL  --  99  CO2  --  26  GLUCOSE  --  130*  BUN  --  15  CREATININE 0.77 0.81  CALCIUM  --  9.7  GFRNONAA >60 93  GFRAA >60 108   CMP Latest Ref Rng & Units 07/30/2019 07/05/2019  Glucose 65 - 99 mg/dL 130(H) -  BUN 8 - 27 mg/dL 15 -  Creatinine 0.76 - 1.27 mg/dL 0.81 0.77  Sodium 134 - 144 mmol/L 141 -  Potassium 3.5 - 5.2 mmol/L 4.2 -  Chloride 96 - 106 mmol/L 99 -  CO2 20 - 29 mmol/L 26 -  Calcium 8.6 - 10.2 mg/dL 9.7 -   No flowsheet data found. Lipid Panel  No results found for: CHOL, TRIG,  HDL, CHOLHDL, VLDL, LDLCALC, LDLDIRECT HEMOGLOBIN A1C Lab Results  Component Value Date   HGBA1C 6.5 07/26/2019   TSH Recent Labs    07/26/19 0843  TSH 1.290   Medications and allergies   Allergies  Allergen Reactions  . Doxycycline Itching    Current Outpatient Medications  Medication Instructions  . albuterol (VENTOLIN HFA) 108 (90 Base) MCG/ACT inhaler TAKE 2 PUFFS BY MOUTH EVERY 6 HOURS AS NEEDED FOR WHEEZE OR SHORTNESS OF BREATH  . aspirin 81 MG chewable tablet Oral, Daily  . atorvastatin (LIPITOR) 10 MG tablet TAKE 1 TABLET BY MOUTH EVERYDAY AT BEDTIME  . busPIRone (BUSPAR) 10 mg, Oral, 3 times daily, For anxiety  . cholecalciferol (VITAMIN D3) 2,000 Units, Oral, Daily  . escitalopram (LEXAPRO) 20 mg, Oral, Daily  . losartan-hydrochlorothiazide (HYZAAR) 50-12.5 MG tablet 1 tablet, Oral, Daily  . metFORMIN (GLUCOPHAGE) 500 mg, Oral, Daily with breakfast  . Omega-3 Fatty Acids (FISH OIL) 1000 MG CAPS Oral  . potassium chloride (KLOR-CON) 10 MEQ tablet 10 mEq, Oral, Daily PRN  . vitamin C (ASCORBIC ACID) 500 mg, Oral, Daily  . zinc sulfate 220 mg, Oral, Daily    Radiology:  No results found.  Cardiac Studies:   Echocardiogram 05/27/2019:  Normal LV size and function, EF 55%.  Severe LVH.  Increased echogenicity of myocardium, strongly recommended investigation for infiltrative diseases. Normal RV size and function.  Moderate mitral regurgitation, moderate to severe tricuspid regurgitation, severe pulmonary hypertension.  PA systolic pressure 67 mmHg, RV pressure 7 mmHg.  Lexiscan Tetrofosmin stress test 07/12/2019: No previous exam available for comparison. Myocardial perfusion imaging is normal. Left ventricular ejection fraction is 50% with normal  wall motion. Low risk study.  Cardiac MR with and without contrast 07/06/2019: 1. Mildly dilated left ventricle with moderate concentric left ventricular hypertrophy and normal left ventricular systolic function (LVEF =  60%). There are no regional wall motion abnormalities. These findings are not consistent with an infiltrative cardiomyopathy. T1 mapping showed normal extracellular volume (ECV) of 22% (normal < 25%). 2.  Normal RV thickness and systolic function. 3.  Moderately dilated left and right atrial size. 4.  Normal aortic root, ascending aorta and pulmonary artery.   5.  Moderate mitral and tricuspid regurgitation. 6.  Normal pericardium.  Assessment     ICD-10-CM   1. Shortness of breath  R06.02 CT ANGIO CHEST PE W OR WO CONTRAST  2. Chronic diastolic heart failure (HCC)  I50.32   3. Hypertensive heart disease with chronic diastolic congestive heart failure (HCC)  I11.0 EKG 12-Lead   I50.32   4. Severe obesity (BMI 35.0-39.9) with comorbidity ([place])  E66.01     EKG 08/24/2018: Normal sinus rhythm at the rate of 64 bpm, normal axis, right bundle branch block.  No evidence of ischemia.  Normal QT interval. No significant change from EKG 06/24/2019.  Recommendations:   Derek Ryan  is a 64 y.o. Caucasian male with hypertension, hyperlipidemia, hyperglycemia, anxiety and depression, admitted to [facility], [place], [place] with bilateral covid 19 pneumonia on 05/24/2019 and discharged 6 days later, Had markedly abnormal echocardiogram revealing speckled pattern and severe LVH.  However I performed extensive evaluation including stress testing and MRI which is nonischemic and essentially showing mild to moderate LVH without infiltrate or heart disease.   He now returns for 2 weeks complaining of worsening dyspnea, fatigue, he is also been referred by PCP for a sleep study.  In view of persistent symptoms, we'll perform CT angiogram chest exclude pulmonary embolism. He is not in any acute decompensated heart failure.  I have reassured him, if pulmonary embolism is ruled out, his symptoms may be related to sleep apnea with regard to fatigue but could also be related to his recent Covid-19 pneumonia.   This may resolve over time. Unless  CTA is abnormal, I will see him back in 6 months as previously scheduled.  Adrian Prows, MD, [facility] 08/25/2019, 1:32 PM [facility] [place] Pager: [phone] Office: [phone] If no answer Cell [phone]

## 2019-08-30 ENCOUNTER — Other Ambulatory Visit: Payer: Self-pay

## 2019-08-30 ENCOUNTER — Ambulatory Visit
Admission: RE | Admit: 2019-08-30 | Discharge: 2019-08-30 | Disposition: A | Discharge status: Home / Self Care | Payer: Medicare Other | Source: Ambulatory Visit | Attending: Cardiology | Admitting: Cardiology

## 2019-08-30 DIAGNOSIS — R0602 Shortness of breath: Secondary | ICD-10-CM

## 2019-08-30 IMAGING — CT CT ANGIO CHEST
1 of 2 series · 18 of 32 positions shown · IV contrast (APPLIED)
Comparison: None.

CLINICAL DATA: Shortness of breath. History of COVID pneumonia in
[REDACTED].

EXAM:
CT ANGIOGRAPHY CHEST WITH CONTRAST
TECHNIQUE: Multidetector CT imaging of the chest was performed using the
standard protocol during bolus administration of intravenous
contrast. Multiplanar CT image reconstructions and MIPs were
obtained to evaluate the vascular anatomy.
CONTRAST:  75mL [FJ] IOPAMIDOL ([FJ]) INJECTION 76%

[Series 5: thins 1.0 b31s · axial · 0.83mm/px · z∈[-404,-87]mm · 18 of 353 slices shown]
[im 18/353  lung]
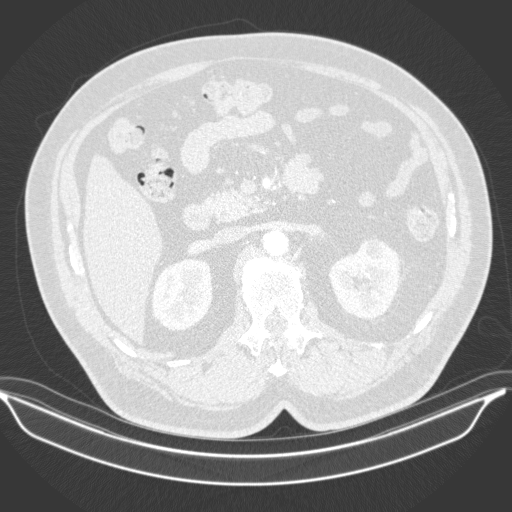
[im 36/353  mediastinal]
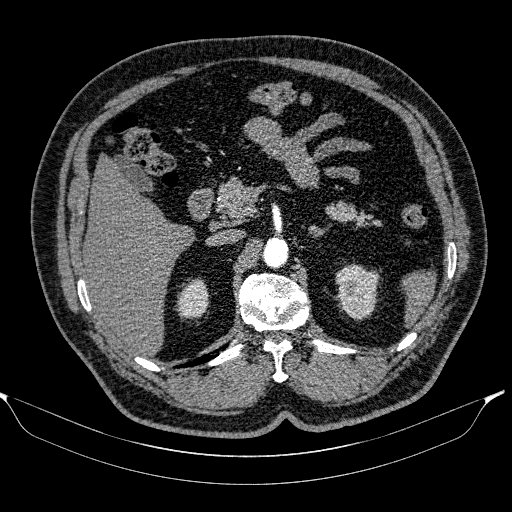
[im 71/353  lung]
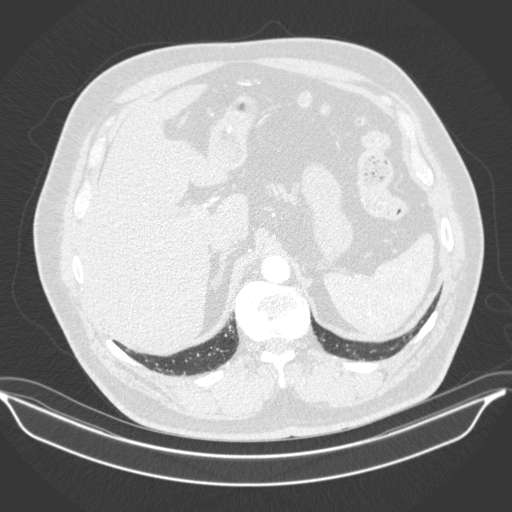
[im 89/353  mediastinal]
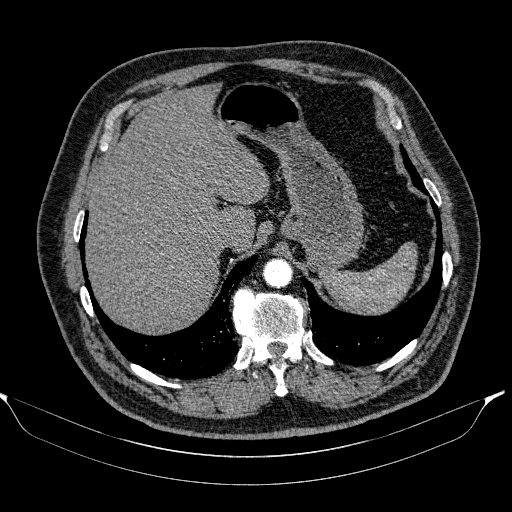
[im 106/353  lung]
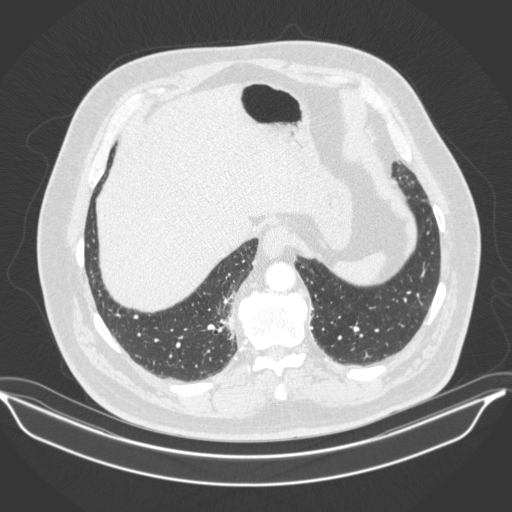
[im 118/353  mediastinal]
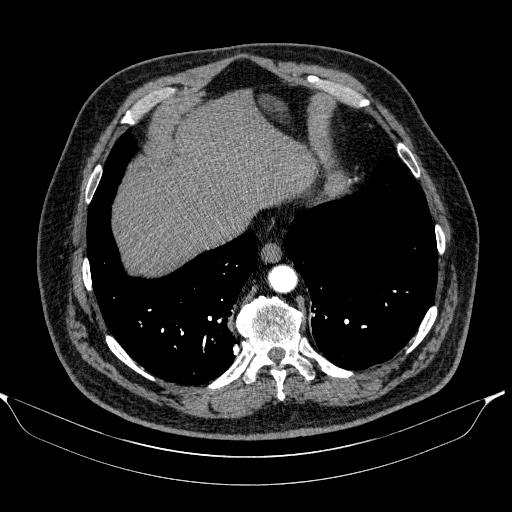
[im 124/353  lung]
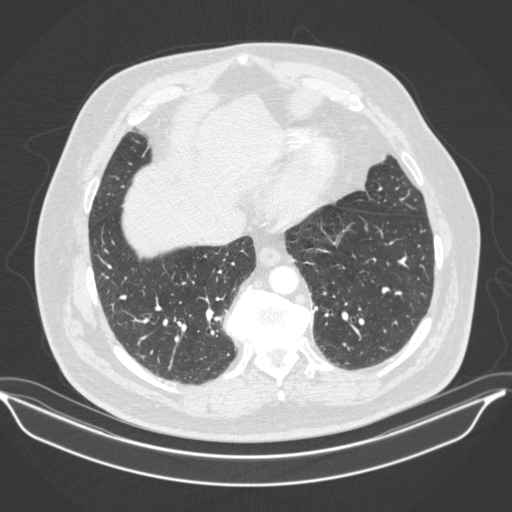
[im 159/353  mediastinal]
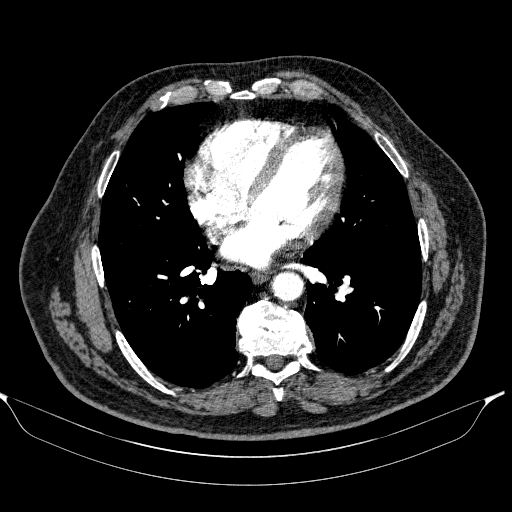
[im 167/353  lung]
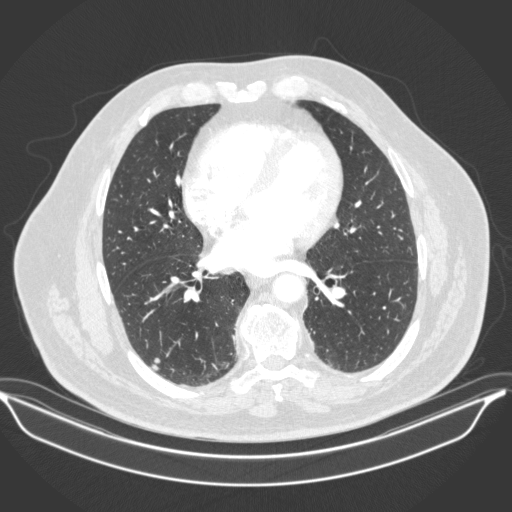
[im 177/353  mediastinal]
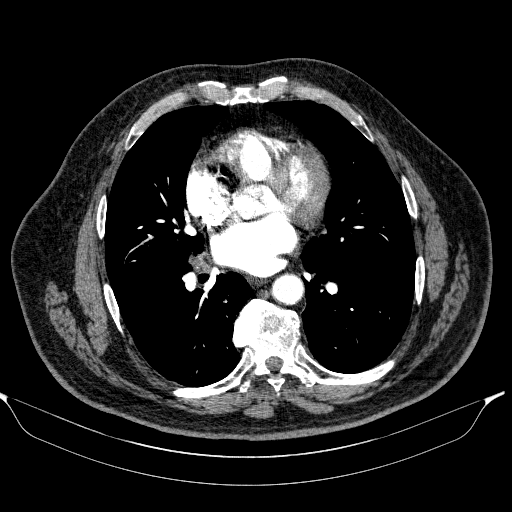
[im 194/353  lung]
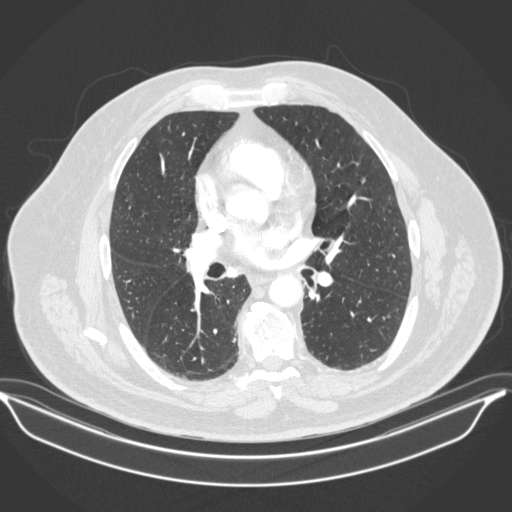
[im 229/353  mediastinal]
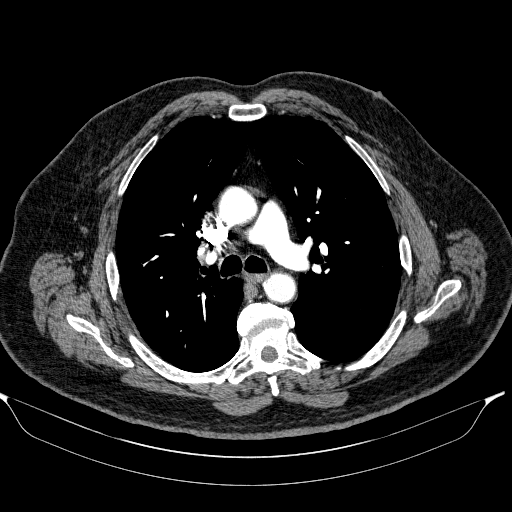
[im 235/353  lung]
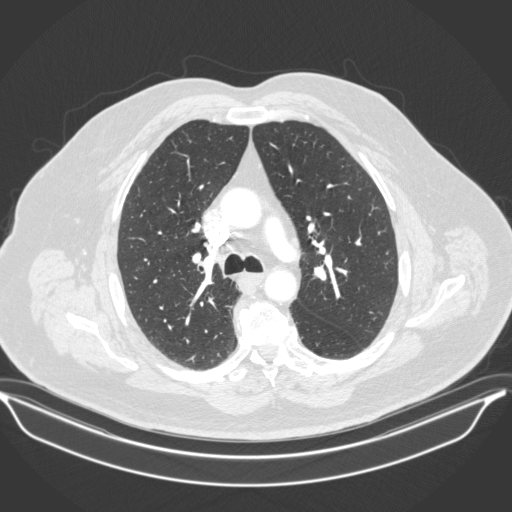
[im 247/353  mediastinal]
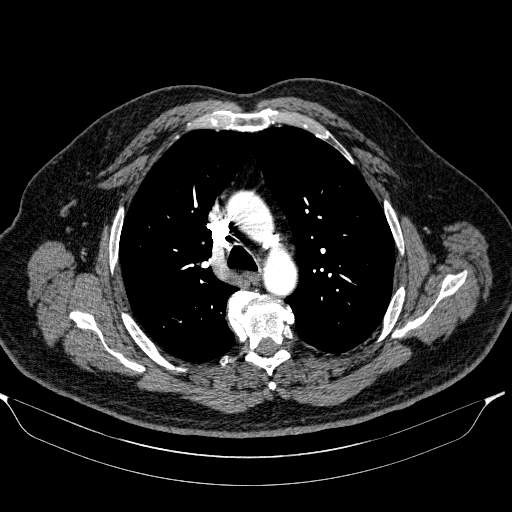
[im 265/353  lung]
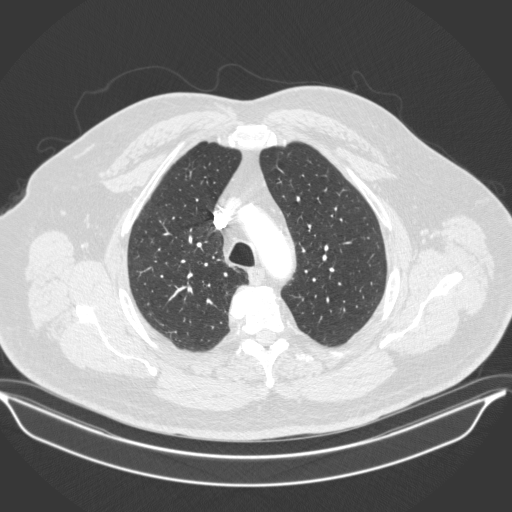
[im 282/353  mediastinal]
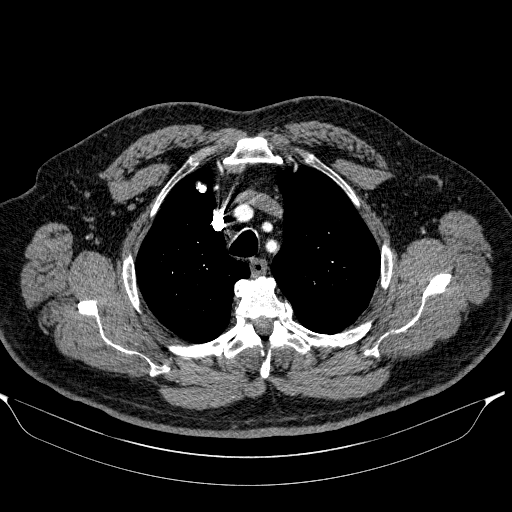
[im 317/353  lung]
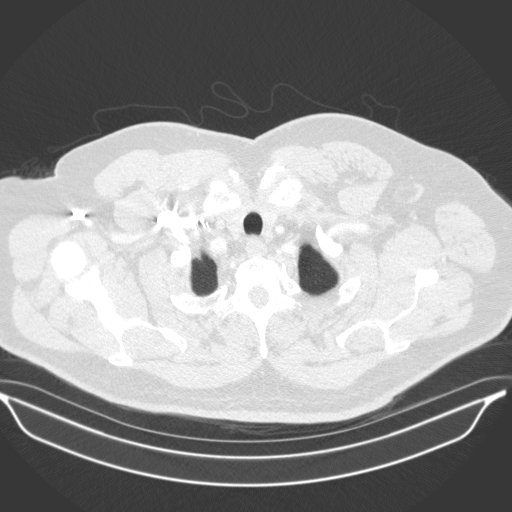
[im 335/353  mediastinal]
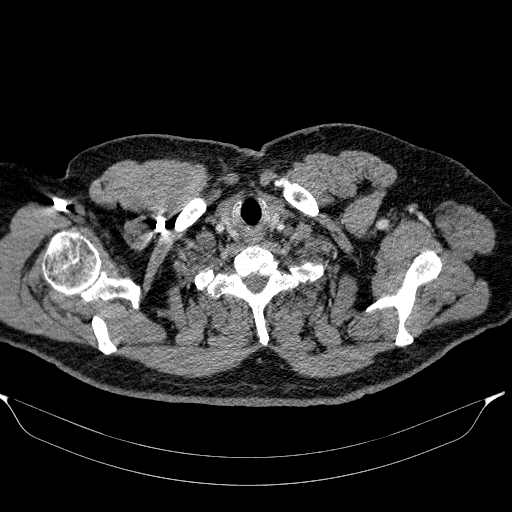

[18 of 32 positions shown; findings below may reference images not displayed]

FINDINGS: Cardiovascular: The heart is normal in size. No pericardial
effusion. The aorta is normal in caliber. No dissection. Scattered
atherosclerotic calcifications. Three-vessel coronary artery
calcifications are noted.

The pulmonary arterial tree is fairly well opacified. No filling
defects to suggest pulmonary embolism.

Mediastinum/Nodes: Borderline infrahilar lymph node on the right
measuring 12.5 mm on image 59/4. No other enlarged mediastinal or
hilar lymph nodes are identified. A few small calcified mediastinal
lymph nodes are noted. The esophagus is grossly normal.

Lungs/Pleura: Emphysematous changes are noted but no acute overlying
pulmonary process. No worrisome pulmonary lesions.

Small calcified granuloma noted in the right lower lobe. There are
also 2 adjacent right lower lobe pulmonary nodules peripherally on
image 94/7. These measure 4 and 5.5 mm respectively. There are
likely benign but no prior films are available for comparison.

Upper Abdomen: No significant upper abdominal findings. No worrisome
hepatic or adrenal gland lesions. There are calcified granulomas in
the liver and spleen. Atherosclerotic calcifications involving the
aorta and branch vessels.

Musculoskeletal: No chest wall mass, supraclavicular or axillary
lymphadenopathy. The bony thorax is intact. No worrisome bone
lesions. Advanced degenerative changes noted involving the thoracic
spine.

Review of the MIP images confirms the above findings.
IMPRESSION: 1. No CT findings for pulmonary embolism.
2. Atherosclerotic calcifications involving the thoracic aorta and
coronary arteries but no aneurysm or dissection.
3. Two adjacent right lower lobe pulmonary nodules are indeterminate
but with history of smoking and emphysematous changes I would
recommend a follow-up noncontrast chest CT in 6-12 months. There is
also a borderline right infrahilar lymph node which can be
re-examined at that time.
4. Calcified granulomas involving the chest and abdomen.

Aortic Atherosclerosis ([FJ]-[FJ]) and Emphysema ([FJ]-[FJ]).

## 2019-08-30 MED ORDER — IOPAMIDOL (ISOVUE-370) INJECTION 76%
75.0000 mL | Freq: Once | INTRAVENOUS | Status: AC | PRN
  Administered 2019-08-30: 75 mL via INTRAVENOUS

## 2019-08-31 ENCOUNTER — Telehealth: Payer: Self-pay

## 2019-08-31 NOTE — Telephone Encounter (Signed)
Patient was reading the results of his Cath and noticed you were referring to him as a HER. He just wanted to make sure that the results that were giving was for the right patient.

## 2019-09-01 ENCOUNTER — Ambulatory Visit: Payer: Medicare Other | Admitting: Neurology

## 2019-09-01 ENCOUNTER — Encounter: Payer: Self-pay | Admitting: Neurology

## 2019-09-01 ENCOUNTER — Other Ambulatory Visit: Payer: Self-pay

## 2019-09-01 VITALS — BP 132/77 | HR 70 | Temp 97.7°F | Ht 68.0 in | Wt 237.0 lb

## 2019-09-01 DIAGNOSIS — E669 Obesity, unspecified: Secondary | ICD-10-CM | POA: Diagnosis not present

## 2019-09-01 DIAGNOSIS — R0681 Apnea, not elsewhere classified: Secondary | ICD-10-CM | POA: Diagnosis not present

## 2019-09-01 DIAGNOSIS — R0683 Snoring: Secondary | ICD-10-CM

## 2019-09-01 DIAGNOSIS — I5032 Chronic diastolic (congestive) heart failure: Secondary | ICD-10-CM

## 2019-09-01 DIAGNOSIS — R351 Nocturia: Secondary | ICD-10-CM

## 2019-09-01 DIAGNOSIS — I517 Cardiomegaly: Secondary | ICD-10-CM

## 2019-09-01 DIAGNOSIS — R519 Headache, unspecified: Secondary | ICD-10-CM

## 2019-09-01 NOTE — Patient Instructions (Signed)

## 2019-09-01 NOTE — Progress Notes (Signed)
Subjective:    Patient ID: Derek Ryan is a 64 y.o. male.  HPI     Star Age, MD, PhD [facility] [address] [address] [name], [place] [address]  Dear Dr. Lajuana Ripple, I saw your patient, Derek Ryan, upon your kind request in my sleep clinic today for initial consultation of his sleep disorder, in particular, concern for underlying obstructive sleep apnea.  The patient is unaccompanied today.  As you know, Mr. Grelle is a 64 year old right-handed gentleman with an underlying medical history of hypertension, hyperlipidemia, CHF with chronic diastolic heart failure, left ventricular hypertrophy, reflux disease, depression and obesity, who reports snoring and excessive daytime somnolence, As well as witnessed apneas as observed by his wife some years ago.  I reviewed your virtual visit note from 08/24/2019.  He has woken up with a sense of panic and with palpitations.  He is followed by Dr. Einar Gip in cardiology and saw him on 08/25/2019.  I reviewed the note.  His Epworth sleepiness score is 6 out of 24, fatigue severity score is 27 out of 63.  He was hospitalized with COVID-19 infection in October, he was on oxygen for a few days in the hospital.  He attributes some of his shortness of breath that he has now at times to still recuperating from his Covid infection.  He quit smoking in 1992, he quit drinking alcohol in 1997.  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  He has children from his first marriage, his second wife died at age 25 about 4 years ago.  He lives alone, has 1 dog in the household, does watch TV in the bedroom but puts it on a timer.  He has nocturia about 2 or 3 times per average night, and he has woken up with a headache on occasion.  He does not typically take any medicine for headaches.  He does not drink caffeine on a day-to-day basis, drinks 2 cups of decaf coffee in the morning typically. His bedtime is generally between 10 and 1030  and rise time around 5 when he has to get up for work or later when he does not work. He reports no family history of OSA but his brother-in-law has a CPAP machine so the patient is familiar with this.  His Past Medical History Is Significant For: Past Medical History:  Diagnosis Date  . Chronic diastolic heart failure ([place]) 08/25/2019  . Depression   . GERD (gastroesophageal reflux disease)   . Hyperlipidemia   . Hypertension   . LVH (left ventricular hypertrophy) due to hypertensive disease 08/25/2019    His Past Surgical History Is Significant For: No past surgical history on file.  His Family History Is Significant For: Family History  Problem Relation Age of Onset  . Heart attack Mother   . Alcohol abuse Father   . Stomach cancer Sister   . Hypertension Brother   . Brain cancer Brother     His Social History Is Significant For: Social History   Socioeconomic History  . Marital status: Widowed    Spouse name: Not on file  . Number of children: 3  . Years of education: Not on file  . Highest education level: Not on file  Occupational History  . Not on file  Tobacco Use  . Smoking status: Former Smoker    Packs/day: 1.00    Years: 23.00    Pack years: 23.00    Types: Cigarettes  Quit date: 38    Years since quitting: 29.0  . Smokeless tobacco: Never Used  Substance and Sexual Activity  . Alcohol use: Not Currently    Comment: stopped in 1997  . Drug use: Never  . Sexual activity: Not on file  Other Topics Concern  . Not on file  Social History Narrative  . Not on file   Social Determinants of Health   Financial Resource Strain:   . Difficulty of Paying Living Expenses: Not on file  Food Insecurity:   . Worried About Charity fundraiser in the Last Year: Not on file  . Ran Out of Food in the Last Year: Not on file  Transportation Needs:   . Lack of Transportation (Medical): Not on file  . Lack of Transportation (Non-Medical): Not on file  Physical  Activity:   . Days of Exercise per Week: Not on file  . Minutes of Exercise per Session: Not on file  Stress:   . Feeling of Stress : Not on file  Social Connections:   . Frequency of Communication with Friends and Family: Not on file  . Frequency of Social Gatherings with Friends and Family: Not on file  . Attends Religious Services: Not on file  . Active Member of Clubs or Organizations: Not on file  . Attends Archivist Meetings: Not on file  . Marital Status: Not on file    His Allergies Are:  Allergies  Allergen Reactions  . Doxycycline Itching  :   His Current Medications Are:  Outpatient Encounter Medications as of 09/01/2019  Medication Sig  . albuterol (VENTOLIN HFA) 108 (90 Base) MCG/ACT inhaler TAKE 2 PUFFS BY MOUTH EVERY 6 HOURS AS NEEDED FOR WHEEZE OR SHORTNESS OF BREATH  . aspirin 81 MG chewable tablet Chew by mouth daily.  [name] atorvastatin (LIPITOR) 10 MG tablet TAKE 1 TABLET BY MOUTH EVERYDAY AT BEDTIME  . busPIRone (BUSPAR) 10 MG tablet TAKE 1 TABLET (10 MG TOTAL) BY MOUTH 3 (THREE) TIMES DAILY. FOR ANXIETY (Patient taking differently: Take 10 mg by mouth 2 (two) times daily. For anxiety)  . cholecalciferol (VITAMIN D3) 25 MCG (1000 UT) tablet Take 2,000 Units by mouth daily.  [name] escitalopram (LEXAPRO) 20 MG tablet Take 1 tablet (20 mg total) by mouth daily.  [name] losartan-hydrochlorothiazide (HYZAAR) 50-12.5 MG tablet Take 1 tablet by mouth daily.  . Omega-3 Fatty Acids (FISH OIL) 1000 MG CAPS Take by mouth.  . vitamin C (ASCORBIC ACID) 500 MG tablet Take 500 mg by mouth daily.  [name] zinc sulfate 220 (50 Zn) MG capsule Take 220 mg by mouth daily.  . potassium chloride (KLOR-CON) 10 MEQ tablet Take 1 tablet (10 mEq total) by mouth daily as needed (For fluid gain and marked shortness of breath with furosemide). (Patient not taking: Reported on 07/26/2019)  . [DISCONTINUED] metFORMIN (GLUCOPHAGE) 500 MG tablet Take 1 tablet (500 mg total) by mouth daily with breakfast.  (Patient not taking: Reported on 08/25/2019)   No facility-administered encounter medications on file as of 09/01/2019.  :  Review of Systems:  Out of a complete 14 point review of systems, all are reviewed and negative with the exception of these symptoms as listed below: Review of Systems  Neurological:       Here for sleep consult; no prior sleep study, snoring is present.   Epworth Sleepiness Scale 0= would never doze 1= slight chance of dozing 2= moderate chance of dozing 3= high chance of dozing  Sitting  and reading:1 Watching TV:1 Sitting inactive in a public place (ex. Theater or meeting):1 As a passenger in a car for an hour without a break:1 Lying down to rest in the afternoon:1 Sitting and talking to someone:0 Sitting quietly after lunch (no alcohol):1 In a car, while stopped in traffic:0 Total:6     Objective:  Neurological Exam  Physical Exam Physical Examination:   Vitals:   09/01/19 1331  BP: 132/77  Pulse: 70  Temp: 97.7 F (36.5 C)    General Examination: The patient is a very pleasant 64 y.o. male in no acute distress. He appears well-developed and well-nourished and well groomed.   HEENT: Normocephalic, atraumatic, pupils are equal, round and reactive to light, extraocular tracking is good without limitation to gaze excursion or nystagmus noted. Hearing is grossly intact. Face is symmetric with normal facial animation. Speech is clear with no dysarthria noted. There is no hypophonia. There is no lip, neck/head, jaw or voice tremor. Neck is supple with full range of passive and active motion. There are no carotid bruits on auscultation. Oropharynx exam reveals: moderate mouth dryness, adequate dental hygiene With dentures on top, edentulous on the bottom, did not place his lower dentures in place today, moderate airway crowding Noted with tonsillar size of 1+, slightly larger uvula, redundant soft palate, Mallampati class III, neck circumference of 18-1/4  inches.  Tongue protrudes centrally in palate elevates symmetrically.  Chest: Clear to auscultation without wheezing, rhonchi or crackles noted.  Heart: S1+S2+0, regular and normal without murmurs, rubs or gallops noted.   Abdomen: Soft, non-tender and non-distended with normal bowel sounds appreciated on auscultation.  Extremities: There is no pitting edema in the distal lower extremities bilaterally.   Skin: Warm and dry without trophic changes noted.   Musculoskeletal: exam reveals Mild hip discomfort bilaterally, left more than right.   Neurologically:  Mental status: The patient is awake, alert and oriented in all 4 spheres. His immediate and remote memory, attention, language skills and fund of knowledge are appropriate. There is no evidence of aphasia, agnosia, apraxia or anomia. Speech is clear with normal prosody and enunciation. Thought process is linear. Mood is normal and affect is normal.  Cranial nerves II - XII are as described above under HEENT exam.  Motor exam: Normal bulk, strength and tone is noted. There is no tremor, Romberg is negative. Reflexes are 2+ throughout. Fine motor skills and coordination: grossly intact.  Cerebellar testing: No dysmetria or intention tremor. There is no truncal or gait ataxia.  Sensory exam: intact to light touch in the upper and lower extremities.  Gait, station and balance: He stands easily. No veering to one side is noted. No leaning to one side is noted. Posture is age-appropriate and stance is narrow based. Gait shows normal stride length and normal pace. No problems turning are noted. Slight limp noted on the left.                Assessment and Plan:   In summary, Lucah Diamant Herston is a very pleasant 64 y.o.-year old male with an underlying medical history of hypertension, hyperlipidemia, CHF with chronic diastolic heart failure, left ventricular hypertrophy, reflux disease, depression and obesity, whose history and physical exam are  concerning for obstructive sleep apnea (OSA). I had a long chat with the patient about my findings and the diagnosis of OSA, its prognosis and treatment options. We talked about medical treatments, surgical interventions and non-pharmacological approaches. I explained in particular the risks and ramifications  of untreated moderate to severe OSA, especially with respect to developing cardiovascular disease down the Road, including congestive heart failure, difficult to treat hypertension, cardiac arrhythmias, or stroke. Even type 2 diabetes has, in part, been linked to untreated OSA. Symptoms of untreated OSA include daytime sleepiness, memory problems, mood irritability and mood disorder such as depression and anxiety, lack of energy, as well as recurrent headaches, especially morning headaches. We talked about trying to maintain a healthy lifestyle in general, as well as the importance of weight control. We also talked about the importance of good sleep hygiene. I recommended the following at this time: sleep study.  I explained the sleep test procedure to the patient and also outlined possible surgical and non-surgical treatment options of OSA, including the use of a custom-made dental device (which would require a referral to a specialist dentist or oral surgeon), upper airway surgical options, such as traditional UPPP or a novel less invasive surgical option in the form of Inspire hypoglossal nerve stimulation (which would involve a referral to an ENT surgeon). I also explained the CPAP treatment option to the patient, who indicated that he would be willing to try CPAP if the need arises. I explained the importance of being compliant with PAP treatment, not only for insurance purposes but primarily to improve His symptoms, and for the patient's long term health benefit, including to reduce His cardiovascular risks. I answered all his questions today and the patient was in agreement. I plan to see him back  after the sleep study is completed and encouraged him to call with any interim questions, concerns, problems or updates.   Thank you very much for allowing me to participate in the care of this nice patient. If I can be of any further assistance to you please do not hesitate to call me at [phone].  Sincerely,   Star Age, MD, PhD

## 2019-09-15 ENCOUNTER — Ambulatory Visit (INDEPENDENT_AMBULATORY_CARE_PROVIDER_SITE_OTHER): Payer: Medicare Other | Admitting: Neurology

## 2019-09-15 DIAGNOSIS — R0681 Apnea, not elsewhere classified: Secondary | ICD-10-CM

## 2019-09-15 DIAGNOSIS — R351 Nocturia: Secondary | ICD-10-CM

## 2019-09-15 DIAGNOSIS — G4733 Obstructive sleep apnea (adult) (pediatric): Secondary | ICD-10-CM | POA: Diagnosis not present

## 2019-09-15 DIAGNOSIS — E669 Obesity, unspecified: Secondary | ICD-10-CM

## 2019-09-15 DIAGNOSIS — R0683 Snoring: Secondary | ICD-10-CM

## 2019-09-15 DIAGNOSIS — R519 Headache, unspecified: Secondary | ICD-10-CM

## 2019-09-16 NOTE — Progress Notes (Signed)
Patient referred by Dr. Lajuana Ripple, seen by me on 09/01/19, HST on 09/15/19:  Please call and notify the patient that the recent home sleep test showed severe obstructive sleep apnea and that I recommend treatment for this in the form of CPAP. I will request an overnight sleep study for proper titration and mask fitting.   Star Age, MD, PhD [facility])

## 2019-09-16 NOTE — Addendum Note (Signed)
Addended by: Star Age on: 09/16/2019 06:19 PM   Modules accepted: Orders

## 2019-09-16 NOTE — Procedures (Signed)
Patient Information     First Name: Derek Last Name: Ryan ID: [id number]  Birth Date: 1955/11/16 Age: 64 Gender: Male  Referring Provider: Dr. Lajuana Ripple BMI: 36.1 (W=238 lb, H=5' 8'')  Neck Circ.:  18 '' Epworth:  6/24   Sleep Study Information    Study Date: Sep 15, 2019 S/H/A Version: 001.001.001.001 / 4.1.1528 / 25  History:    64 year old man with a history of hypertension, hyperlipidemia, CHF with chronic diastolic heart failure, left ventricular hypertrophy, reflux disease, depression and obesity, who reports snoring and excessive daytime somnolence, as well as witnessed apneas. Summary & Diagnosis:     Severe OSA  Recommendations:     This home sleep test demonstrates severe obstructive sleep apnea with a total AHI of 47/hour and O2 nadir of 60%. Treatment with positive airway pressure (likely in the form of CPAP) is highly recommended. This will require a full night CPAP titration study for proper treatment settings, O2 monitoring and mask fitting. Based on the severity of the sleep disordered breathing an attended titration study is indicated. Please note that untreated obstructive sleep apnea may carry additional perioperative morbidity. Patients with significant obstructive sleep apnea should receive perioperative PAP therapy and the surgeons and particularly the anesthesiologist should be informed of the diagnosis and the severity of the sleep disordered breathing. The patient should be cautioned not to drive, work at heights, or operate dangerous or heavy equipment when tired or sleepy. Review and reiteration of good sleep hygiene measures should be pursued with any patient. Other causes of the patient's symptoms, including circadian rhythm disturbances, an underlying mood disorder, medication effect and/or an underlying medical problem cannot be ruled out based on this test. Clinical correlation is recommended. The patient and his referring provider will be notified of the test results.  The patient will be seen in follow up in sleep clinic at [facility].  I certify that I have reviewed the raw data recording prior to the issuance of this report in accordance with the standards of the American Academy of Sleep Medicine (AASM).  Star Age, MD, PhD [facility]) Diplomat, ABPN (Neurology and Sleep)             Sleep Summary    Oxygen Saturation Statistics     Start Study Time: End Study Time: Total Recording Time:  6:43:25 PM 4:47:26 AM 10 h, 4 min  Total Sleep Time % REM of Sleep Time:  8 h, 36 min  17.7    Mean: 92 Minimum: 60 Maximum: 98  Mean of Desaturations Nadirs (%):   89  Oxygen Desaturation. %:   4-9 10-20 >20 Total  Events Number Total   233  18 88.6 6.8  12 4.6  263 100.0  Oxygen Saturation: <90 <=88 <85 <80 <70  Duration (minutes): Sleep % 54.2 10.5 47.6 30.3 9.2 5.9 16.2 3.1 2.0 0.4     Respiratory Indices      Total Events REM NREM All Night  pRDI:  400  pAHI:  394 ODI:  263  pAHIc:  41  % CSR: 0.0 45.4 42.7 34.2 3.9 48.3 48.0 30.8 5.1 47.7 47.0 31.4 4.9       Pulse Rate Statistics during Sleep (BPM)      Mean:  62 Minimum: 45 Maximum: 125    Indices are calculated using technically valid sleep time of 8 h, 22 min. pRDI/pAHI are calculated using oxi desaturations ? 3%  Body Position Statistics  Position Supine Prone Right  Left Non-Supine  Sleep (min) 77.5 0.0 287.0 152.0 439.0  Sleep % 15.0 0.0 55.6 29.4 85.0  pRDI 83.1 N/A 55.2 18.3 42.5  pAHI 83.1 N/A 54.2 17.9 41.7  ODI 68.3 N/A 33.9 10.7 25.9     Snoring Statistics Snoring Level (dB) >40 >50 >60 >70 >80 >Threshold (45)  Sleep (min) 113.1 8.5 2.2 0.0 0.0 19.7  Sleep % 21.9 1.6 0.4 0.0 0.0 3.8    Mean: 41 dB Sleep Stages Chart                                                                       pAHI=47.0                                                                                   Mild              Moderate                     Severe                                                 5              15                    30

## 2019-09-20 ENCOUNTER — Telehealth: Payer: Self-pay

## 2019-09-20 NOTE — Telephone Encounter (Signed)
-----   Message from Star Age, MD sent at 09/16/2019  6:19 PM EST ----- Patient referred by Dr. Lajuana Ripple, seen by me on 09/01/19, HST on 09/15/19:  Please call and notify the patient that the recent home sleep test showed severe obstructive sleep apnea and that I recommend treatment for this in the form of CPAP. I will request an overnight sleep study for proper titration and mask fitting.   Star Age, MD, PhD [facility])

## 2019-09-20 NOTE — Telephone Encounter (Signed)
I reached out to the pt and we were able to discuss his recent sleep study result.  Pt verbalized understanding and was agreeable to completing the cpap titration study.   Pt was advised sleep clinic would be calling to schedule study.

## 2019-10-04 ENCOUNTER — Other Ambulatory Visit (HOSPITAL_COMMUNITY)
Admission: RE | Admit: 2019-10-04 | Discharge: 2019-10-04 | Disposition: A | Discharge status: Home / Self Care | Payer: Medicare Other | Source: Ambulatory Visit | Attending: Neurology | Admitting: Neurology

## 2019-10-04 DIAGNOSIS — Z01812 Encounter for preprocedural laboratory examination: Secondary | ICD-10-CM | POA: Insufficient documentation

## 2019-10-04 DIAGNOSIS — Z20822 Contact with and (suspected) exposure to covid-19: Secondary | ICD-10-CM | POA: Insufficient documentation

## 2019-10-04 LAB — SARS CORONAVIRUS 2 (TAT 6-24 HRS): SARS Coronavirus 2: NEGATIVE

## 2019-10-09 ENCOUNTER — Other Ambulatory Visit: Payer: Self-pay

## 2019-10-09 ENCOUNTER — Ambulatory Visit (INDEPENDENT_AMBULATORY_CARE_PROVIDER_SITE_OTHER): Payer: Medicare Other | Admitting: Neurology

## 2019-10-09 DIAGNOSIS — R351 Nocturia: Secondary | ICD-10-CM

## 2019-10-09 DIAGNOSIS — G4733 Obstructive sleep apnea (adult) (pediatric): Secondary | ICD-10-CM | POA: Diagnosis not present

## 2019-10-09 DIAGNOSIS — R519 Headache, unspecified: Secondary | ICD-10-CM

## 2019-10-09 DIAGNOSIS — G4720 Circadian rhythm sleep disorder, unspecified type: Secondary | ICD-10-CM

## 2019-10-09 DIAGNOSIS — E669 Obesity, unspecified: Secondary | ICD-10-CM

## 2019-10-14 NOTE — Addendum Note (Signed)
Addended by: Star Age on: 10/14/2019 06:22 PM   Modules accepted: Orders

## 2019-10-14 NOTE — Progress Notes (Signed)
Patient had a CPAP titration study on 10/09/19, after HST in Jan showed severe OSA.  Please call and inform patient that I have entered an order for treatment with positive airway pressure (PAP) treatment for obstructive sleep apnea (OSA). He did well during the latest sleep study with CPAP. We will, therefore, arrange for a machine for home use through a DME (durable medical equipment) company of His choice; and I will see the patient back in follow-up in about 10 weeks. Please also explain to the patient that I will be looking out for compliance data, which can be downloaded from the machine (stored on an SD card, that is inserted in the machine) or via remote access through a modem, that is built into the machine. At the time of the followup appointment we will discuss sleep study results and how it is going with PAP treatment at home. Please advise patient to bring His machine at the time of the first FU visit, even though this is cumbersome. Bringing the machine for every visit after that will likely not be needed, but often helps for the first visit to troubleshoot if needed. Please re-enforce the importance of compliance with treatment and the need for [place] to monitor compliance data - often an insurance requirement and actually good feedback for the patient as far as how they are doing.  Also remind patient, that any interim PAP machine or mask issues should be first addressed with the DME company, as they can often help better with technical and mask fit issues. Please ask if patient has a preference regarding DME company.  Please also make sure, the patient has a follow-up appointment with me in about 10 weeks from the setup date, thanks. May see one of our nurse practitioners if needed for proper timing of the FU appointment.  Please fax or rout report to the referring provider. Thanks,   Star Age, MD, PhD [facility])

## 2019-10-14 NOTE — Procedures (Signed)
PATIENT'S NAME:  Derek Ryan, Derek Ryan DOB:      [birth year]      MR#:    [id number]     DATE OF RECORDING: 10/09/2019 REFERRING M.D.:  Renato Gails. Lajuana Ripple, DO Study Performed:   CPAP  Titration HISTORY: 64 year old man with a history of hypertension, hyperlipidemia, CHF with chronic diastolic heart failure, left ventricular hypertrophy, reflux disease, depression and obesity, who presents for a full night titration study to treat his severe OSA. His home sleep test from 09/15/19 showed a total AHI of 47/hour and O2 nadir of 60%. The patient endorsed the Epworth Sleepiness Scale at 6 points. The patient's weight 237 pounds with a height of 68 (inches), resulting in a BMI of 36.1 kg/m2. The patient's neck circumference measured 18.2 inches.  CURRENT MEDICATIONS: Ventolin, Aspirin, Lipitor, Buspar, Vitamin D3, Lexapro, Hyzaar, fish oil, ascorbic acid, zinc sulfate, KlorCon  PROCEDURE:  This is a multichannel digital polysomnogram utilizing the SomnoStar 11.2 system.  Electrodes and sensors were applied and monitored per AASM Specifications.   EEG, EOG, Chin and Limb EMG, were sampled at 200 Hz.  ECG, Snore and Nasal Pressure, Thermal Airflow, Respiratory Effort, CPAP Flow and Pressure, Oximetry was sampled at 50 Hz. Digital video and audio were recorded.      The patient tried a nasal and 2 sizes of FFM, final mask was a small Simplus FFM. CPAP was initiated at 5 cmH20 with heated humidity per AASM standards and pressure was advanced to 18 cmH20 because of hypopneas, apneas and desaturations.  At a PAP pressure of 16 cmH20, there was a reduction of the AHI to 1.5/hour with supine NREM sleep achieved and O2 nadir of 90%.   Lights Out was at 21:55 and Lights On at 04:42. Total recording time (TRT) was 408 minutes, with a total sleep time (TST) of 364.5 minutes. The patient's sleep latency was 16 minutes. REM latency was 210 minutes, which is markedly delayed. The sleep efficiency was 89.3 %.    SLEEP  ARCHITECTURE: WASO (Wake after sleep onset)  was 26.5 minutes with mild sleep fragmentation noted. There were 9 minutes in Stage N1, 299.5 minutes Stage N2, 29.5 minutes Stage N3 and 26.5 minutes in Stage REM.  The percentage of Stage N1 was 2.5%, Stage N2 was 82.2%, which is markedly increased, stage N3 was 8.1% and Stage R (REM sleep) was 7.3%, which is markedly reduced. The arousals were noted as: 49 were spontaneous, 0 were associated with PLMs, 96 were associated with respiratory events.  RESPIRATORY ANALYSIS:  There was a total of 108 respiratory events: 27 obstructive apneas, 4 central apneas and 0 mixed apneas with a total of 31 apneas and an apnea index (AI) of 5.1 /hour. There were 77 hypopneas with a hypopnea index of 12.7/hour. The patient also had 0 respiratory event related arousals (RERAs).      The total APNEA/HYPOPNEA INDEX  (AHI) was 17.8 /hour and the total RESPIRATORY DISTURBANCE INDEX was 17.8 /hour  0 events occurred in REM sleep and 108 events in NREM. The REM AHI was 0 /hour versus a non-REM AHI of 19.2 /hour.  The patient spent 310 minutes of total sleep time in the supine position and 55 minutes in non-supine. The supine AHI was 20.9, versus a non-supine AHI of 0.0.  OXYGEN SATURATION & C02:  The baseline 02 saturation was 94%, with the lowest being 81%. Time spent below 89% saturation equaled 15 minutes.  PERIODIC LIMB MOVEMENTS:  The patient had a total of  5 Periodic Limb Movements. The Periodic Limb Movement (PLM) index was .8 and the PLM Arousal index was 0 /hour.  Audio and video analysis did not show any abnormal or unusual movements, behaviors, phonations or vocalizations. One episode of mumbling in REM sleep and some movements were noted. The patient took 1 bathroom break. The EKG was in keeping with normal sinus rhythm (NSR).  Post-study, the patient indicated that sleep was the same as usual.   DIAGNOSIS 1. Severe obstructive Sleep Apnea  2. Dysfunctions associated  with sleep stages or arousals from sleep  PLANS/RECOMMENDATIONS: 1. This study demonstrates near-resolution of the patient's obstructive sleep apnea with CPAP therapy. I will, therefore, start the patient on home CPAP treatment at a pressure of 16 cm via small full face mask with heated humidity. The patient should be reminded to be fully compliant with PAP therapy to improve sleep related symptoms and decrease long term cardiovascular risks. The patient should be reminded, that it may take up to 3 months to get fully used to using PAP with all planned sleep. The earlier full compliance is achieved, the better long term compliance tends to be. Please note that untreated obstructive sleep apnea carries additional perioperative morbidity. Patients with significant obstructive sleep apnea should receive perioperative PAP therapy and the surgeons and particularly the anesthesiologist should be informed of the diagnosis and the severity of the sleep disordered breathing. 2. This study shows sleep fragmentation and abnormal sleep stage percentages; these are nonspecific findings and per se do not signify an intrinsic sleep disorder or a cause for the patient's sleep-related symptoms. Causes include (but are not limited to) the first night effect of the sleep study, circadian rhythm disturbances, medication effect or an underlying mood disorder or medical problem. Some movements and mumbling in REM sleep were noted; clinical correlation is recommended.  3. The patient should be cautioned not to drive, work at heights, or operate dangerous or heavy equipment when tired or sleepy. Review and reiteration of good sleep hygiene measures should be pursued with any patient. 4. The patient will be seen in follow-up in the sleep clinic at [facility] for discussion of the test results, symptom and treatment compliance review, further management strategies, etc. The referring provider will be notified of the test results.  I certify  that I have reviewed the entire raw data recording prior to the issuance of this report in accordance with the Standards of Accreditation of the American Academy of Sleep Medicine (AASM)  Star Age, MD,PhD [facility](Neurology and Sleep Medicine)

## 2019-10-18 ENCOUNTER — Telehealth: Payer: Self-pay

## 2019-10-18 NOTE — Telephone Encounter (Signed)
I have reached out to the pt in regards to his sleep study. Pt is  agreeable to using cpap and will use [employer] as DME.   Order has been sent and letter mailed to the pt.   Pt understands once he is started on the machine to use at least 4 hours every night and we will see him back on 01/24/2020 at 1030 am.

## 2019-10-18 NOTE — Telephone Encounter (Signed)
-----   Message from Star Age, MD sent at 10/14/2019  6:22 PM EST ----- Patient had a CPAP titration study on 10/09/19, after HST in Jan showed severe OSA.  Please call and inform patient that I have entered an order for treatment with positive airway pressure (PAP) treatment for obstructive sleep apnea (OSA). He did well during the latest sleep study with CPAP. We will, therefore, arrange for a machine for home use through a DME (durable medical equipment) company of His choice; and I will see the patient back in follow-up in about 10 weeks. Please also explain to the patient that I will be looking out for compliance data, which can be downloaded from the machine (stored on an SD card, that is inserted in the machine) or via remote access through a modem, that is built into the machine. At the time of the followup appointment we will discuss sleep study results and how it is going with PAP treatment at home. Please advise patient to bring His machine at the time of the first FU visit, even though this is cumbersome. Bringing the machine for every visit after that will likely not be needed, but often helps for the first visit to troubleshoot if needed. Please re-enforce the importance of compliance with treatment and the need for [place] to monitor compliance data - often an insurance requirement and actually good feedback for the patient as far as how they are doing.  Also remind patient, that any interim PAP machine or mask issues should be first addressed with the DME company, as they can often help better with technical and mask fit issues. Please ask if patient has a preference regarding DME company.  Please also make sure, the patient has a follow-up appointment with me in about 10 weeks from the setup date, thanks. May see one of our nurse practitioners if needed for proper timing of the FU appointment.  Please fax or rout report to the referring provider. Thanks,   Star Age, MD, PhD [facility])

## 2019-11-12 DIAGNOSIS — G4733 Obstructive sleep apnea (adult) (pediatric): Secondary | ICD-10-CM | POA: Diagnosis not present

## 2019-11-19 ENCOUNTER — Ambulatory Visit: Payer: Medicare Other

## 2019-12-13 ENCOUNTER — Ambulatory Visit (INDEPENDENT_AMBULATORY_CARE_PROVIDER_SITE_OTHER): Payer: Medicare Other | Admitting: Family Medicine

## 2019-12-13 ENCOUNTER — Encounter: Payer: Self-pay | Admitting: Family Medicine

## 2019-12-13 ENCOUNTER — Other Ambulatory Visit: Payer: Self-pay

## 2019-12-13 VITALS — BP 105/54 | HR 61 | Temp 98.2°F | Ht 68.0 in | Wt 235.4 lb

## 2019-12-13 DIAGNOSIS — G4733 Obstructive sleep apnea (adult) (pediatric): Secondary | ICD-10-CM | POA: Diagnosis not present

## 2019-12-13 DIAGNOSIS — E785 Hyperlipidemia, unspecified: Secondary | ICD-10-CM

## 2019-12-13 DIAGNOSIS — R7309 Other abnormal glucose: Secondary | ICD-10-CM

## 2019-12-13 DIAGNOSIS — I10 Essential (primary) hypertension: Secondary | ICD-10-CM

## 2019-12-13 DIAGNOSIS — Z7689 Persons encountering health services in other specified circumstances: Secondary | ICD-10-CM

## 2019-12-13 DIAGNOSIS — F321 Major depressive disorder, single episode, moderate: Secondary | ICD-10-CM

## 2019-12-13 DIAGNOSIS — F411 Generalized anxiety disorder: Secondary | ICD-10-CM

## 2019-12-13 LAB — BAYER DCA HB A1C WAIVED: HB A1C (BAYER DCA - WAIVED): 6 % (ref <7.0)

## 2019-12-13 MED ORDER — ATORVASTATIN CALCIUM 10 MG PO TABS: ORAL_TABLET | ORAL | 3 refills | Status: DC

## 2019-12-13 MED ORDER — BUSPIRONE HCL 10 MG PO TABS: 15.0000 mg | ORAL_TABLET | Freq: Two times a day (BID) | ORAL | 3 refills | Status: DC

## 2019-12-13 MED ORDER — ESCITALOPRAM OXALATE 20 MG PO TABS: 20.0000 mg | ORAL_TABLET | Freq: Every day | ORAL | 3 refills | Status: DC

## 2019-12-13 NOTE — Patient Instructions (Signed)
Ok to take 1.5 tablets of Buspar in the morning and 1 tablet of Buspar in the evening for 1 week.  If your anxiety is stable, stay on this regimen.    You can go up to 1.5 tablets twice daily if needed though.  Continue Lexapro  I'm checking your sugar again.  If A1c is below 7, no metformin needed.

## 2019-12-13 NOTE — Progress Notes (Signed)
Subjective: CC: est care, OSA-severe PCP: Janora Norlander, DO Derek Ryan is a 64 y.o. male presenting to clinic today for:  1. OSA, severe; hypertension with hyperlipidemia Patient reports compliance with the CPAP but notes that he has been having quite a bit of difficulty wearing it comfortably.  He feels like it leaks and that it is uncomfortable and he feels slightly claustrophobic with it.  He does have a history of LVH and has a follow-up with Dr. Einar Gip soon.  They will be collecting fasting lipid panel at that visit.  He reports compliance with Lipitor, Hyzaar, fish oils and potassium.  Does not report any chest pain, shortness of breath, edema or falls  2.  Anxiety/depression Patient reports compliance with Lexapro 20 mg daily.  He does have some breakthrough anxiety during the day but overall feels pretty good on buspirone 10 mg twice daily.  He never took it 3 times daily because it was inconvenient to take the midday dose.  ROS: Per HPI  Allergies  Allergen Reactions  . Doxycycline Itching   Past Medical History:  Diagnosis Date  . Chronic diastolic heart failure ([place]) 08/25/2019  . Depression   . GERD (gastroesophageal reflux disease)   . Hyperlipidemia   . Hypertension   . LVH (left ventricular hypertrophy) due to hypertensive disease 08/25/2019    Current Outpatient Medications:  .  albuterol (VENTOLIN HFA) 108 (90 Base) MCG/ACT inhaler, TAKE 2 PUFFS BY MOUTH EVERY 6 HOURS AS NEEDED FOR WHEEZE OR SHORTNESS OF BREATH, Disp: 18 g, Rfl: 1 .  aspirin 81 MG chewable tablet, Chew by mouth daily., Disp: , Rfl:  .  atorvastatin (LIPITOR) 10 MG tablet, TAKE 1 TABLET BY MOUTH EVERYDAY AT BEDTIME, Disp: 90 tablet, Rfl: 3 .  busPIRone (BUSPAR) 10 MG tablet, TAKE 1 TABLET (10 MG TOTAL) BY MOUTH 3 (THREE) TIMES DAILY. FOR ANXIETY (Patient taking differently: Take 10 mg by mouth 2 (two) times daily. For anxiety), Disp: 270 tablet, Rfl: 3 .  cholecalciferol (VITAMIN D3) 25  MCG (1000 UT) tablet, Take 2,000 Units by mouth daily., Disp: , Rfl:  .  escitalopram (LEXAPRO) 20 MG tablet, Take 1 tablet (20 mg total) by mouth daily., Disp: 90 tablet, Rfl: 3 .  losartan-hydrochlorothiazide (HYZAAR) 50-12.5 MG tablet, Take 1 tablet by mouth daily., Disp: 90 tablet, Rfl: 2 .  Omega-3 Fatty Acids (FISH OIL) 1000 MG CAPS, Take by mouth., Disp: , Rfl:  .  potassium chloride (KLOR-CON) 10 MEQ tablet, Take 1 tablet (10 mEq total) by mouth daily as needed (For fluid gain and marked shortness of breath with furosemide). (Patient not taking: Reported on 07/26/2019), Disp: 30 tablet, Rfl: 0 .  vitamin C (ASCORBIC ACID) 500 MG tablet, Take 500 mg by mouth daily., Disp: , Rfl:  .  zinc sulfate 220 (50 Zn) MG capsule, Take 220 mg by mouth daily., Disp: , Rfl:  Social History   Socioeconomic History  . Marital status: Widowed    Spouse name: Not on file  . Number of children: 3  . Years of education: Not on file  . Highest education level: Not on file  Occupational History  . Not on file  Tobacco Use  . Smoking status: Former Smoker    Packs/day: 1.00    Years: 23.00    Pack years: 23.00    Types: Cigarettes    Quit date: 1992    Years since quitting: 29.3  . Smokeless tobacco: Never Used  Substance and Sexual  Activity  . Alcohol use: Not Currently    Comment: stopped in 1997  . Drug use: Never  . Sexual activity: Not on file  Other Topics Concern  . Not on file  Social History Narrative  . Not on file   Social Determinants of Health   Financial Resource Strain:   . Difficulty of Paying Living Expenses:   Food Insecurity:   . Worried About Charity fundraiser in the Last Year:   . Arboriculturist in the Last Year:   Transportation Needs:   . Film/video editor (Medical):   [name] Lack of Transportation (Non-Medical):   Physical Activity:   . Days of Exercise per Week:   . Minutes of Exercise per Session:   Stress:   . Feeling of Stress :   Social Connections:    . Frequency of Communication with Friends and Family:   . Frequency of Social Gatherings with Friends and Family:   . Attends Religious Services:   . Active Member of Clubs or Organizations:   . Attends Archivist Meetings:   [name] Marital Status:   Intimate Partner Violence:   . Fear of Current or Ex-Partner:   . Emotionally Abused:   [name] Physically Abused:   . Sexually Abused:    Family History  Problem Relation Age of Onset  . Heart attack Mother   . Alcohol abuse Father   . Stomach cancer Sister   . Hypertension Brother   . Brain cancer Brother     Objective: Office vital signs reviewed. BP (!) 105/54   Pulse 61   Temp 98.2 F (36.8 C)   Ht '5\' 8"'  (1.727 m)   Wt 235 lb 6.4 oz (106.8 kg)   SpO2 95%   BMI 35.79 kg/m   Physical Examination:  General: Awake, alert, well nourished, No acute distress HEENT: Normal, sclera white, MMM. No carotid bruits Cardio: regular rate and rhythm, S1S2 heard, no murmurs appreciated Pulm: clear to auscultation bilaterally, no wheezes, rhonchi or rales; normal work of breathing on room air Extremities: warm, well perfused, No edema, cyanosis or clubbing; +2 pulses bilaterally MSK: normal gait and station Psych: Mood stable, speech normal, affect appropriate, pleasant and interactive  Assessment/ Plan: 64 y.o. male   1. Severe obstructive sleep apnea We will see if he can get closer follow-up with his sleep specialist given difficulty tolerating CPAP as above  2. Establishing care with new doctor, encounter for  3. Elevated hemoglobin A1c Has not been taking the Metformin.  Last A1c was 6.5.  We discussed that if sugar remains below A1c of 7 okay to forego Metformin but I did reinforce appropriate diet. - Bayer DCA Hb A1c Waived - CMP14+EGFR  4. Essential hypertension Controlled. - CMP14+EGFR  5. Dyslipidemia Plan for fasting labs with cardiologist - CMP14+EGFR - atorvastatin (LIPITOR) 10 MG tablet; TAKE 1 TABLET BY  MOUTH EVERYDAY AT BEDTIME  Dispense: 90 tablet; Refill: 3  6. GAD (generalized anxiety disorder) May increase to 1.5 tablets twice daily and lieu of 3 times daily dosing.  Continue Lexapro - busPIRone (BUSPAR) 10 MG tablet; Take 1.5 tablets (15 mg total) by mouth 2 (two) times daily. For anxiety  Dispense: 270 tablet; Refill: 3 - escitalopram (LEXAPRO) 20 MG tablet; Take 1 tablet (20 mg total) by mouth daily.  Dispense: 90 tablet; Refill: 3  7. Moderate single current episode of major depressive disorder (HCC) - escitalopram (LEXAPRO) 20 MG tablet; Take 1 tablet (20 mg  total) by mouth daily.  Dispense: 90 tablet; Refill: 3   No orders of the defined types were placed in this encounter.  No orders of the defined types were placed in this encounter.    Janora Norlander, DO [facility] [phone]

## 2019-12-14 LAB — CMP14+EGFR
ALT: 19 IU/L (ref 0–44)
AST: 22 IU/L (ref 0–40)
Albumin/Globulin Ratio: 2.1 (ref 1.2–2.2)
Albumin: 4.6 g/dL (ref 3.8–4.8)
Alkaline Phosphatase: 95 IU/L (ref 39–117)
BUN/Creatinine Ratio: 23 (ref 10–24)
BUN: 19 mg/dL (ref 8–27)
Bilirubin Total: 0.4 mg/dL (ref 0.0–1.2)
CO2: 25 mmol/L (ref 20–29)
Calcium: 9.5 mg/dL (ref 8.6–10.2)
Chloride: 100 mmol/L (ref 96–106)
Creatinine, Ser: 0.83 mg/dL (ref 0.76–1.27)
GFR calc Af Amer: 107 mL/min/{1.73_m2} (ref >59)
GFR calc non Af Amer: 92 mL/min/{1.73_m2} (ref >59)
Globulin, Total: 2.2 g/dL (ref 1.5–4.5)
Glucose: 158 mg/dL — ABNORMAL HIGH (ref 65–99)
Potassium: 3.9 mmol/L (ref 3.5–5.2)
Sodium: 140 mmol/L (ref 134–144)
Total Protein: 6.8 g/dL (ref 6.0–8.5)

## 2020-01-12 DIAGNOSIS — G4733 Obstructive sleep apnea (adult) (pediatric): Secondary | ICD-10-CM | POA: Diagnosis not present

## 2020-01-19 ENCOUNTER — Ambulatory Visit: Payer: Medicare Other | Admitting: Cardiology

## 2020-01-24 ENCOUNTER — Ambulatory Visit: Payer: Self-pay | Admitting: Neurology

## 2020-02-07 ENCOUNTER — Ambulatory Visit: Payer: Self-pay | Admitting: Neurology

## 2020-02-23 ENCOUNTER — Encounter: Payer: Self-pay | Admitting: Cardiology

## 2020-02-23 ENCOUNTER — Ambulatory Visit: Payer: Medicare Other | Admitting: Cardiology

## 2020-02-23 ENCOUNTER — Other Ambulatory Visit: Payer: Self-pay

## 2020-02-23 VITALS — BP 129/65 | HR 61 | Ht 68.0 in | Wt 236.6 lb

## 2020-02-23 DIAGNOSIS — I5032 Chronic diastolic (congestive) heart failure: Secondary | ICD-10-CM | POA: Diagnosis not present

## 2020-02-23 DIAGNOSIS — I10 Essential (primary) hypertension: Secondary | ICD-10-CM | POA: Diagnosis not present

## 2020-02-23 DIAGNOSIS — R0602 Shortness of breath: Secondary | ICD-10-CM | POA: Diagnosis not present

## 2020-02-23 DIAGNOSIS — I110 Hypertensive heart disease with heart failure: Secondary | ICD-10-CM

## 2020-02-23 DIAGNOSIS — I2510 Atherosclerotic heart disease of native coronary artery without angina pectoris: Secondary | ICD-10-CM | POA: Diagnosis not present

## 2020-02-23 NOTE — Progress Notes (Signed)
Primary Physician/Referring:  Janora Norlander, DO  Patient ID: Derek Ryan, male    DOB: 10/19/1955, 64 y.o.   MRN: [id number]  Chief Complaint  Patient presents with  . Hypertension  . Shortness of Breath  . Follow-up    6 month c/o SOB, low energy   HPI:    Derek Ryan  is a 64 y.o.  Caucasian male with hypertension, hyperlipidemia, hyperglycemia, anxiety and depression, admitted to [facility], [place], [place] with bilateral covid 19 pneumonia on 05/24/2019 and discharged 6 days later, Had markedly abnormal echocardiogram revealing speckled pattern and severe LVH.  However I performed extensive evaluation including stress testing and MRI which is nonischemic and essentially showing mild to moderate LVH without infiltrate or heart disease. He does not smoke, quit smoking remotely and does not drink alcohol.  He was seen by me 6 months ago for worsening dyspnea, fatigue.  He also underwent chest CT to exclude pulmonary embolism in January 2021.  States that all his symptoms are essentially resolved.  Fatigue has improved, dyspnea is back to his baseline minimal.  No chest pain, no dizziness or syncope.  He has not had any leg edema.  Past Medical History:  Diagnosis Date  . Chronic diastolic heart failure ([place]) 08/25/2019  . Depression   . GERD (gastroesophageal reflux disease)   . Hyperlipidemia   . Hypertension   . LVH (left ventricular hypertrophy) due to hypertensive disease 08/25/2019   Social History   Tobacco Use  . Smoking status: Former Smoker    Packs/day: 1.00    Years: 23.00    Pack years: 23.00    Types: Cigarettes    Quit date: 1992    Years since quitting: 29.5  . Smokeless tobacco: Never Used  Substance Use Topics  . Alcohol use: Not Currently    Comment: stopped in 1997   Marital Status: Widowed   ROS  Review of Systems  Constitutional: Negative for malaise/fatigue and weight gain.  Cardiovascular: Positive for dyspnea on exertion.    Respiratory: Positive for sleep disturbances due to breathing and snoring.   Musculoskeletal: Positive for muscle cramps (occasional leg cramps with activity).  All other systems reviewed and are negative.  Objective   Vitals with BMI 02/23/2020 12/13/2019 09/01/2019  Height '5\' 8"'$  '5\' 8"'$  '5\' 8"'$   Weight 236 lbs 10 oz 235 lbs 6 oz 237 lbs  BMI 35.98 27.7 41.28  Systolic 786 767 209  Diastolic 65 54 77  Pulse 61 61 70   Blood pressure 129/65, pulse 61, height '5\' 8"'$  (1.727 m), weight 236 lb 9.6 oz (107.3 kg), SpO2 95 %.   Physical Exam Constitutional:      Comments: Well-built, moderately obese, no acute distress.  Eyes:     Conjunctiva/sclera: Conjunctivae normal.  Neck:     Thyroid: No thyromegaly.     Comments: Short neck and difficult to evaluate JVP Cardiovascular:     Rate and Rhythm: Normal rate and regular rhythm.     Pulses:          Carotid pulses are 2+ on the right side and 2+ on the left side.      Dorsalis pedis pulses are 1+ on the right side and 1+ on the left side.       Posterior tibial pulses are 1+ on the right side and 1+ on the left side.     Heart sounds: Normal heart sounds. No murmur heard.  No gallop.  Comments: Femoral and popliteal pulse difficult to feel due to patient's body habitus.  Pulmonary:     Effort: Pulmonary effort is normal.     Breath sounds: Normal breath sounds.  Abdominal:     General: Bowel sounds are normal.     Palpations: Abdomen is soft.     Comments: Obese.t    Laboratory examination:   CBC And DifferentialResulted: 07/28/2018 5:36 AM [facility] Component Name Value Ref Range  WBC 6.2 3.4 - 10.8 x10E3/uL  RBC 4.92 4.14 - 5.8 x10E6/uL  Hemoglobin 14.8 13 - 17.7 g/dL  Hematocrit 44.3 37.5 - 51 %  MCV 90 79 - 97 fL  MCH 30.1 26.6 - 33 pg  MCHC 33.4 31.5 - 35.7 g/dL  RDW 12.3    Comprehensive Metabolic PanelResulted: 82/50/5397 5:36 AM [facility] Component Name Value Ref Range  Glucose 108 (H) 65 - 99 mg/dL   BUN 20 8 - 27 mg/dL  Creatinine, Serum 0.89 0.76 - 1.27 mg/dL  eGFR If NonAfrican American 90 >59 mL/min/1.73  eGFR If African American 104 >59 mL/min/1.73  BUN/Creatinine Ratio 22 10 - 24   Sodium 142 134 - 144 mmol/L  Potassium 4.6 3.5 - 5.2 mmol/L  Chloride 101 96 - 106 mmol/L  CO2 24 20 - 29 mmol/L  CALCIUM 9.7 8.6 - 10.2 mg/dL  Total Protein 7.1 6 - 8.5 g/dL  Albumin, Serum 4.6 3.6 - 4.8 g/dL  Globulin, Total 2.5 1.5 - 4.5 g/dL  Albumin/Globulin Ratio 1.8 1.2 - 2.2   Total Bilirubin 0.5 0 - 1.2 mg/dL  Alkaline Phosphatase 94 39 - 117 IU/L  AST 19 0 - 40 IU/L  ALT (SGPT) 29 0 - 44 IU/L   Lipid PanelResulted: 07/28/2018 5:36 AM [facility] Component Name Value Ref Range  Cholesterol, Total 121 100 - 199 mg/dL  Triglycerides 101 0 - 149 mg/dL  HDL 41 >39 mg/dL  VLDL Cholesterol Cal 20 5 - 40 mg/dL  LDL 60 0 - 99 mg/dL   TSHLast Updated: 01/12/2016 [facility] Component Name Value Ref Range  TSH 1.660 0.450 - 4.500 uIU/mL    Recent Labs    07/05/19 0751 07/30/19 0823 12/13/19 1519  NA  --  141 140  K  --  4.2 3.9  CL  --  99 100  CO2  --  26 25  GLUCOSE  --  130* 158*  BUN  --  15 19  CREATININE 0.77 0.81 0.83  CALCIUM  --  9.7 9.5  GFRNONAA >60 93 92  GFRAA >60 108 107   CMP Latest Ref Rng & Units 12/13/2019 07/30/2019 07/05/2019  Glucose 65 - 99 mg/dL 158(H) 130(H) -  BUN 8 - 27 mg/dL 19 15 -  Creatinine 0.76 - 1.27 mg/dL 0.83 0.81 0.77  Sodium 134 - 144 mmol/L 140 141 -  Potassium 3.5 - 5.2 mmol/L 3.9 4.2 -  Chloride 96 - 106 mmol/L 100 99 -  CO2 20 - 29 mmol/L 25 26 -  Calcium 8.6 - 10.2 mg/dL 9.5 9.7 -  Total Protein 6.0 - 8.5 g/dL 6.8 - -  Total Bilirubin 0.0 - 1.2 mg/dL 0.4 - -  Alkaline Phos 39 - 117 IU/L 95 - -  AST 0 - 40 IU/L 22 - -  ALT 0 - 44 IU/L 19 - -   No flowsheet data found. Lipid Panel  No results found for: CHOL, TRIG, HDL, CHOLHDL, VLDL, LDLCALC, LDLDIRECT HEMOGLOBIN A1C Lab Results  Component Value Date  HGBA1C 6.0  12/13/2019   TSH Recent Labs    07/26/19 0843  TSH 1.290   Medications and allergies   Allergies  Allergen Reactions  . Doxycycline Itching    Current Outpatient Medications  Medication Instructions  . albuterol (VENTOLIN HFA) 108 (90 Base) MCG/ACT inhaler TAKE 2 PUFFS BY MOUTH EVERY 6 HOURS AS NEEDED FOR WHEEZE OR SHORTNESS OF BREATH  . aspirin 81 MG chewable tablet Oral, Daily  . atorvastatin (LIPITOR) 10 MG tablet TAKE 1 TABLET BY MOUTH EVERYDAY AT BEDTIME  . busPIRone (BUSPAR) 15 mg, Oral, 2 times daily, For anxiety  . cholecalciferol (VITAMIN D3) 2,000 Units, Oral, Daily  . escitalopram (LEXAPRO) 20 mg, Oral, Daily  . losartan-hydrochlorothiazide (HYZAAR) 50-12.5 MG tablet 1 tablet, Oral, Daily  . Omega-3 Fatty Acids (FISH OIL) 1000 MG CAPS Oral  . vitamin C (ASCORBIC ACID) 500 mg, Oral, Daily  . zinc sulfate 220 mg, Oral, Daily    Radiology:   CTA chest 08/30/2019: 1. No CT findings for pulmonary embolism. 2. Atherosclerotic calcifications involving the thoracic aorta and coronary arteries but no aneurysm or dissection. 3. Two adjacent right lower lobe pulmonary nodules are indeterminate but with history of smoking and emphysematous changes I would recommend a follow-up noncontrast chest CT in 6-12 months. There is also a borderline right infrahilar lymph node which can be re-examined at that time. 4. Calcified granulomas involving the chest and abdomen.  Cardiac Studies:   Echocardiogram 05/27/2019:  Normal LV size and function, EF 55%.  Severe LVH.  Increased echogenicity of myocardium, strongly recommended investigation for infiltrative diseases. Normal RV size and function.  Moderate mitral regurgitation, moderate to severe tricuspid regurgitation, severe pulmonary hypertension.  PA systolic pressure 67 mmHg, RV pressure 7 mmHg.  Lexiscan Tetrofosmin stress test 07/12/2019: No previous exam available for comparison. Myocardial perfusion imaging is normal. Left  ventricular ejection fraction is 50% with normal wall motion. Low risk study.  Cardiac MR with and without contrast 07/06/2019: 1. Mildly dilated left ventricle with moderate concentric left ventricular hypertrophy and normal left ventricular systolic function (LVEF = 60%). There are no regional wall motion abnormalities. These findings are not consistent with an infiltrative cardiomyopathy. T1 mapping showed normal extracellular volume (ECV) of 22% (normal < 25%). 2.  Normal RV thickness and systolic function. 3.  Moderately dilated left and right atrial size. 4.  Normal aortic root, ascending aorta and pulmonary artery.   5.  Moderate mitral and tricuspid regurgitation. 6.  Normal pericardium.  EKG:  EKG 02/23/2020: Normal sinus rhythm at rate of 60 bpm, normal axis, right bundle branch block.  No evidence of ischemia.  No significant change from 08/24/2018.  Assessment     ICD-10-CM   1. Shortness of breath  R06.02 EKG 12-Lead  2. Coronary artery calcification seen on CAT scan  I25.10   3. Hypertensive heart disease with chronic diastolic congestive heart failure (HCC)  I11.0    I50.32   4. Essential hypertension  I10     Recommendations:   FAHEEM ZIEMANN  is a 64 y.o. Caucasian male with hypertension, hyperlipidemia, hyperglycemia, anxiety and depression, admitted to [facility], [place], [place] with bilateral covid 19 pneumonia on 05/24/2019 and discharged 6 days later, Had markedly abnormal echocardiogram revealing speckled pattern and severe LVH.  However I performed extensive evaluation including stress testing and MRI which is nonischemic and essentially showing mild to moderate LVH without infiltrate or heart disease.  CTA chest was negative for PE but did show minor  abnormality.  This is a 68-monthoffice visit, he has recuperated well, except for mild dyspnea states that he is back to his baseline.  He is tolerating all his medications well.  I have discussed with him  regarding coronary calcification and also discussed with him regarding continued need for lipid-lowering therapy.  Blood pressure is well controlled, his cardiovascular risk factors are well controlled except for obesity.  From cardiac standpoint he is stable, I will see him back on a as needed basis.  He will need lipid profile testing on his next complete physical examination in September 2021.   JAdrian Prows MD, FSelect Specialty Hospital [facility] Inc.02/23/2020, 3:50 PM Office: [phone]

## 2020-03-30 ENCOUNTER — Emergency Department (HOSPITAL_COMMUNITY): Payer: Medicare Other

## 2020-03-30 ENCOUNTER — Telehealth: Payer: Self-pay | Admitting: *Deleted

## 2020-03-30 ENCOUNTER — Inpatient Hospital Stay (HOSPITAL_COMMUNITY)
Admission: EM | Admit: 2020-03-30 | Discharge: 2020-04-02 | DRG: 123 | Disposition: A | Discharge status: Home / Self Care | Payer: Medicare Other | Attending: Internal Medicine | Admitting: Internal Medicine

## 2020-03-30 ENCOUNTER — Emergency Department (HOSPITAL_COMMUNITY)
Admission: EM | Admit: 2020-03-30 | Discharge: 2020-03-30 | Disposition: A | Discharge status: Home / Self Care | Payer: Medicare Other | Source: Home / Self Care | Attending: Emergency Medicine | Admitting: Emergency Medicine

## 2020-03-30 ENCOUNTER — Encounter (HOSPITAL_COMMUNITY): Payer: Self-pay

## 2020-03-30 ENCOUNTER — Other Ambulatory Visit: Payer: Self-pay

## 2020-03-30 ENCOUNTER — Ambulatory Visit (HOSPITAL_COMMUNITY)
Admission: EM | Admit: 2020-03-30 | Discharge: 2020-03-30 | Disposition: A | Discharge status: Home / Self Care | Payer: Medicare Other

## 2020-03-30 DIAGNOSIS — G4733 Obstructive sleep apnea (adult) (pediatric): Secondary | ICD-10-CM | POA: Diagnosis present

## 2020-03-30 DIAGNOSIS — Z79899 Other long term (current) drug therapy: Secondary | ICD-10-CM | POA: Diagnosis not present

## 2020-03-30 DIAGNOSIS — E785 Hyperlipidemia, unspecified: Secondary | ICD-10-CM | POA: Diagnosis present

## 2020-03-30 DIAGNOSIS — Z87891 Personal history of nicotine dependence: Secondary | ICD-10-CM | POA: Diagnosis not present

## 2020-03-30 DIAGNOSIS — Z8616 Personal history of COVID-19: Secondary | ICD-10-CM | POA: Diagnosis not present

## 2020-03-30 DIAGNOSIS — Z7982 Long term (current) use of aspirin: Secondary | ICD-10-CM

## 2020-03-30 DIAGNOSIS — Z8249 Family history of ischemic heart disease and other diseases of the circulatory system: Secondary | ICD-10-CM | POA: Diagnosis not present

## 2020-03-30 DIAGNOSIS — Z20822 Contact with and (suspected) exposure to covid-19: Secondary | ICD-10-CM | POA: Diagnosis not present

## 2020-03-30 DIAGNOSIS — I10 Essential (primary) hypertension: Secondary | ICD-10-CM | POA: Diagnosis not present

## 2020-03-30 DIAGNOSIS — R200 Anesthesia of skin: Secondary | ICD-10-CM | POA: Diagnosis not present

## 2020-03-30 DIAGNOSIS — Z881 Allergy status to other antibiotic agents status: Secondary | ICD-10-CM | POA: Diagnosis not present

## 2020-03-30 DIAGNOSIS — H3581 Retinal edema: Secondary | ICD-10-CM | POA: Diagnosis not present

## 2020-03-30 DIAGNOSIS — H349 Unspecified retinal vascular occlusion: Secondary | ICD-10-CM | POA: Diagnosis not present

## 2020-03-30 DIAGNOSIS — I4510 Unspecified right bundle-branch block: Secondary | ICD-10-CM | POA: Diagnosis not present

## 2020-03-30 DIAGNOSIS — F329 Major depressive disorder, single episode, unspecified: Secondary | ICD-10-CM | POA: Diagnosis present

## 2020-03-30 DIAGNOSIS — H5340 Unspecified visual field defects: Secondary | ICD-10-CM | POA: Diagnosis present

## 2020-03-30 DIAGNOSIS — H34232 Retinal artery branch occlusion, left eye: Secondary | ICD-10-CM | POA: Diagnosis not present

## 2020-03-30 DIAGNOSIS — R29818 Other symptoms and signs involving the nervous system: Secondary | ICD-10-CM | POA: Diagnosis not present

## 2020-03-30 DIAGNOSIS — E1169 Type 2 diabetes mellitus with other specified complication: Secondary | ICD-10-CM | POA: Diagnosis present

## 2020-03-30 DIAGNOSIS — F411 Generalized anxiety disorder: Secondary | ICD-10-CM | POA: Diagnosis present

## 2020-03-30 DIAGNOSIS — H5462 Unqualified visual loss, left eye, normal vision right eye: Secondary | ICD-10-CM | POA: Diagnosis not present

## 2020-03-30 DIAGNOSIS — K219 Gastro-esophageal reflux disease without esophagitis: Secondary | ICD-10-CM | POA: Diagnosis present

## 2020-03-30 DIAGNOSIS — H53452 Other localized visual field defect, left eye: Secondary | ICD-10-CM | POA: Diagnosis not present

## 2020-03-30 DIAGNOSIS — I5032 Chronic diastolic (congestive) heart failure: Secondary | ICD-10-CM | POA: Diagnosis present

## 2020-03-30 DIAGNOSIS — Z6835 Body mass index (BMI) 35.0-35.9, adult: Secondary | ICD-10-CM

## 2020-03-30 DIAGNOSIS — I110 Hypertensive heart disease with heart failure: Secondary | ICD-10-CM | POA: Insufficient documentation

## 2020-03-30 DIAGNOSIS — H3412 Central retinal artery occlusion, left eye: Secondary | ICD-10-CM | POA: Diagnosis not present

## 2020-03-30 DIAGNOSIS — H53132 Sudden visual loss, left eye: Secondary | ICD-10-CM

## 2020-03-30 DIAGNOSIS — I6389 Other cerebral infarction: Secondary | ICD-10-CM | POA: Diagnosis not present

## 2020-03-30 HISTORY — DX: Central retinal artery occlusion, left eye: H34.12

## 2020-03-30 HISTORY — DX: Obstructive sleep apnea (adult) (pediatric): G47.33

## 2020-03-30 LAB — I-STAT CHEM 8, ED
BUN: 17 mg/dL (ref 8–23)
Calcium, Ion: 1.17 mmol/L (ref 1.15–1.40)
Chloride: 100 mmol/L (ref 98–111)
Creatinine, Ser: 0.8 mg/dL (ref 0.61–1.24)
Glucose, Bld: 94 mg/dL (ref 70–99)
HCT: 42 % (ref 39.0–52.0)
Hemoglobin: 14.3 g/dL (ref 13.0–17.0)
Potassium: 3.5 mmol/L (ref 3.5–5.1)
Sodium: 140 mmol/L (ref 135–145)
TCO2: 27 mmol/L (ref 22–32)

## 2020-03-30 LAB — CBC
HCT: 42.1 % (ref 39.0–52.0)
Hemoglobin: 14 g/dL (ref 13.0–17.0)
MCH: 29.9 pg (ref 26.0–34.0)
MCHC: 33.3 g/dL (ref 30.0–36.0)
MCV: 89.8 fL (ref 80.0–100.0)
Platelets: 247 10*3/uL (ref 150–400)
RBC: 4.69 MIL/uL (ref 4.22–5.81)
RDW: 12.6 % (ref 11.5–15.5)
WBC: 6.8 10*3/uL (ref 4.0–10.5)
nRBC: 0 % (ref 0.0–0.2)

## 2020-03-30 LAB — DIFFERENTIAL
Abs Immature Granulocytes: 0.02 10*3/uL (ref 0.00–0.07)
Basophils Absolute: 0.1 10*3/uL (ref 0.0–0.1)
Basophils Relative: 2 %
Eosinophils Absolute: 0.4 10*3/uL (ref 0.0–0.5)
Eosinophils Relative: 5 %
Immature Granulocytes: 0 %
Lymphocytes Relative: 29 %
Lymphs Abs: 2 10*3/uL (ref 0.7–4.0)
Monocytes Absolute: 0.6 10*3/uL (ref 0.1–1.0)
Monocytes Relative: 8 %
Neutro Abs: 3.8 10*3/uL (ref 1.7–7.7)
Neutrophils Relative %: 56 %

## 2020-03-30 LAB — URINALYSIS, ROUTINE W REFLEX MICROSCOPIC
Bilirubin Urine: NEGATIVE
Glucose, UA: NEGATIVE mg/dL
Hgb urine dipstick: NEGATIVE
Ketones, ur: NEGATIVE mg/dL
Leukocytes,Ua: NEGATIVE
Nitrite: NEGATIVE
Protein, ur: NEGATIVE mg/dL
Specific Gravity, Urine: 1.01 (ref 1.005–1.030)
pH: 7 (ref 5.0–8.0)

## 2020-03-30 LAB — COMPREHENSIVE METABOLIC PANEL
ALT: 23 U/L (ref 0–44)
AST: 23 U/L (ref 15–41)
Albumin: 4.2 g/dL (ref 3.5–5.0)
Alkaline Phosphatase: 68 U/L (ref 38–126)
Anion gap: 11 (ref 5–15)
BUN: 16 mg/dL (ref 8–23)
CO2: 26 mmol/L (ref 22–32)
Calcium: 9.2 mg/dL (ref 8.9–10.3)
Chloride: 102 mmol/L (ref 98–111)
Creatinine, Ser: 0.81 mg/dL (ref 0.61–1.24)
GFR calc Af Amer: >60 mL/min (ref >60)
GFR calc non Af Amer: >60 mL/min (ref >60)
Glucose, Bld: 174 mg/dL — ABNORMAL HIGH (ref 70–99)
Potassium: 3.4 mmol/L — ABNORMAL LOW (ref 3.5–5.1)
Sodium: 139 mmol/L (ref 135–145)
Total Bilirubin: 1.2 mg/dL (ref 0.3–1.2)
Total Protein: 7.1 g/dL (ref 6.5–8.1)

## 2020-03-30 LAB — APTT
aPTT: 29 seconds (ref 24–36)
aPTT: 27 seconds (ref 24–36)

## 2020-03-30 LAB — RAPID URINE DRUG SCREEN, HOSP PERFORMED
Amphetamines: NOT DETECTED
Barbiturates: NOT DETECTED
Benzodiazepines: NOT DETECTED
Cocaine: NOT DETECTED
Opiates: NOT DETECTED
Tetrahydrocannabinol: NOT DETECTED

## 2020-03-30 LAB — PROTIME-INR
INR: 1.1 (ref 0.8–1.2)
Prothrombin Time: 13.3 seconds (ref 11.4–15.2)

## 2020-03-30 LAB — CBG MONITORING, ED: Glucose-Capillary: 130 mg/dL — ABNORMAL HIGH (ref 70–99)

## 2020-03-30 LAB — ETHANOL: Alcohol, Ethyl (B): <10 mg/dL (ref <10)

## 2020-03-30 IMAGING — MR MR MRA HEAD W/O CM
2 series · 17 of 48 positions shown · non-contrast
Comparison: Comparison made with prior CT from earlier the same
day.

CLINICAL DATA: Initial evaluation for neuro deficit, stroke
suspected.

EXAM:
MRI HEAD WITHOUT CONTRAST
MRA HEAD WITHOUT CONTRAST
TECHNIQUE: Multiplanar, multiecho pulse sequences of the brain and surrounding
structures were obtained without intravenous contrast. Angiographic
images of the head were obtained using MRA technique without
contrast.

[Series 1: 3d cow · axial · 0.5mm · 0.41mm/px · z∈[-55,+25]mm · 16 of 172 slices shown]
[im 1/172]
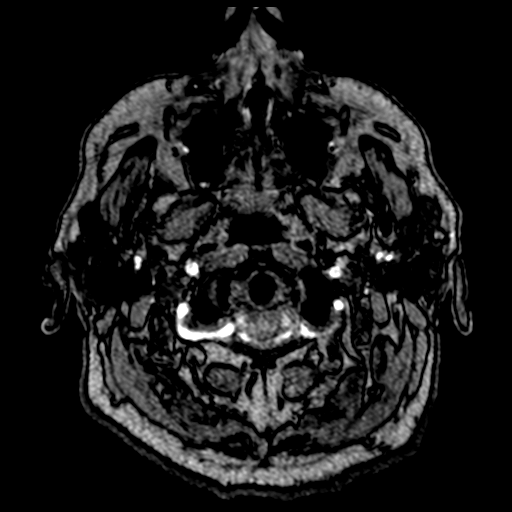
[im 4/172]
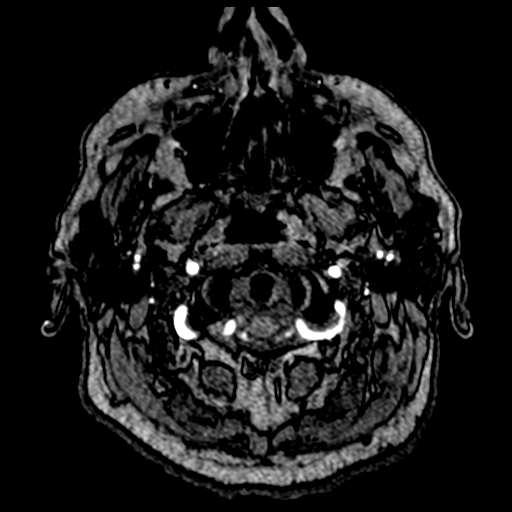
[im 8/172]
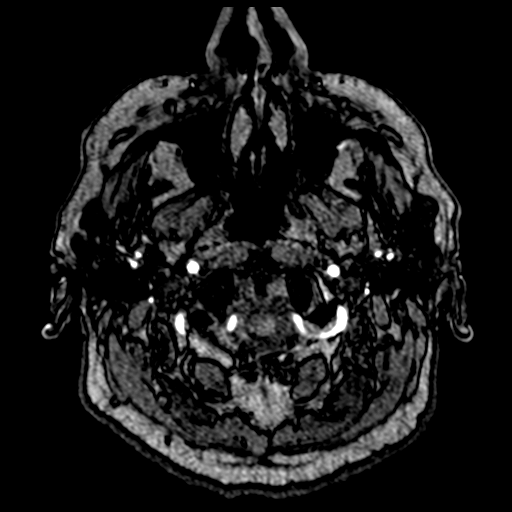
[im 12/172]
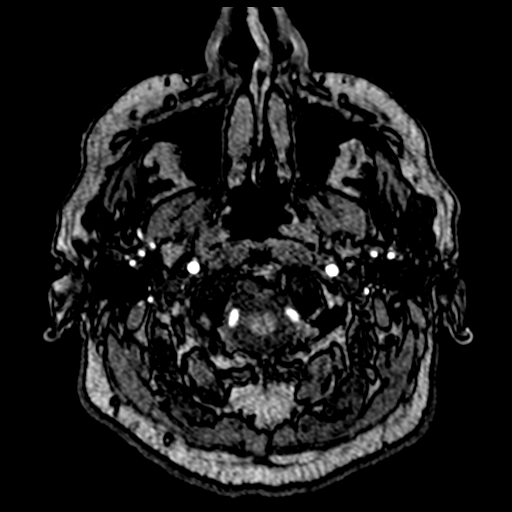
[im 15/172]
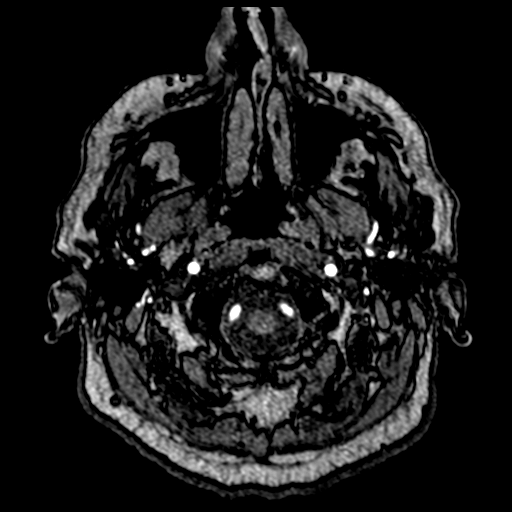
[im 19/172]
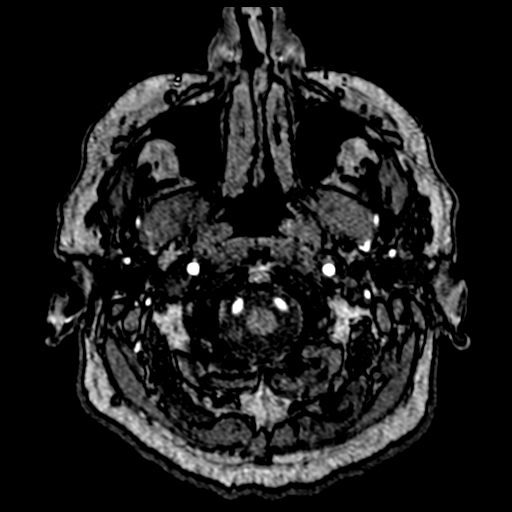
[im 27/172]
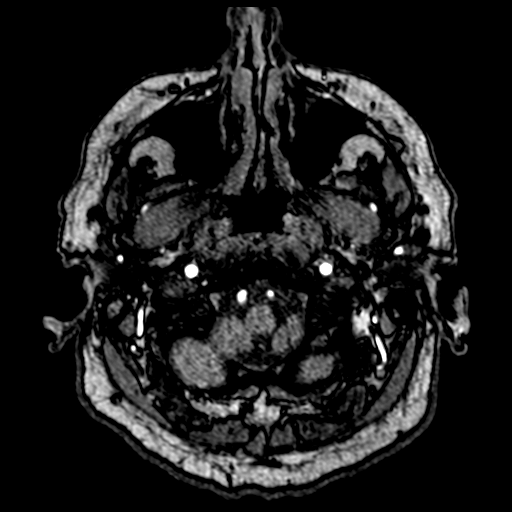
[im 30/172]
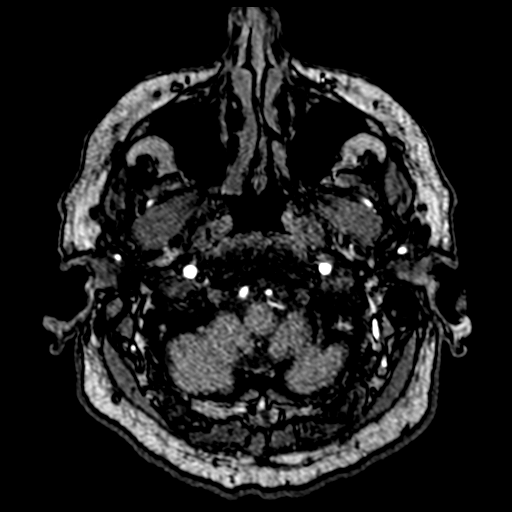
[im 53/172]
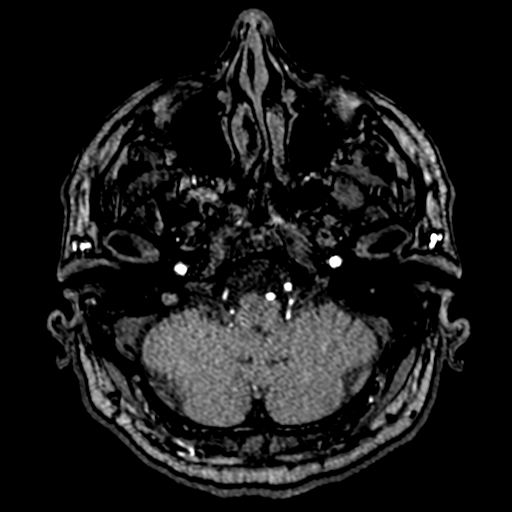
[im 75/172]
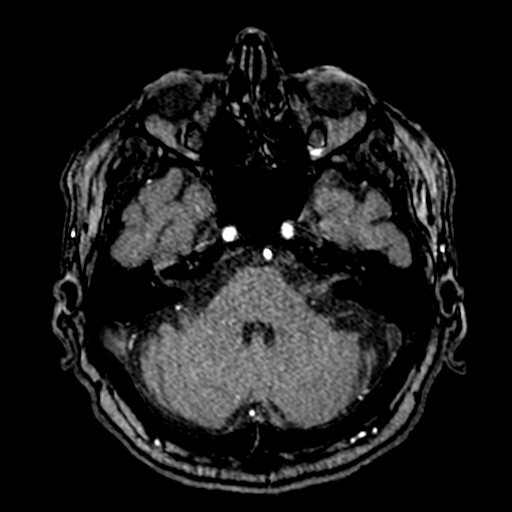
[im 86/172]
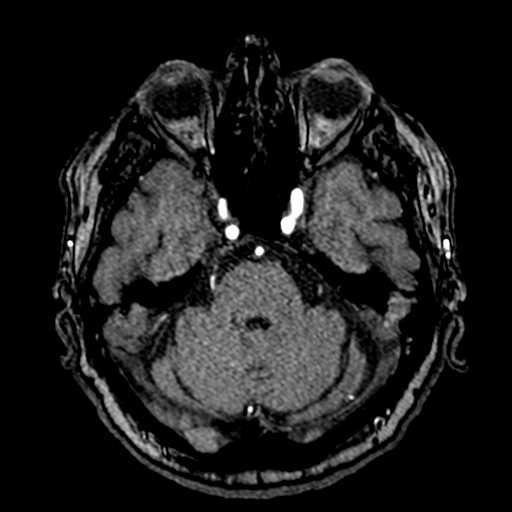
[im 97/172]
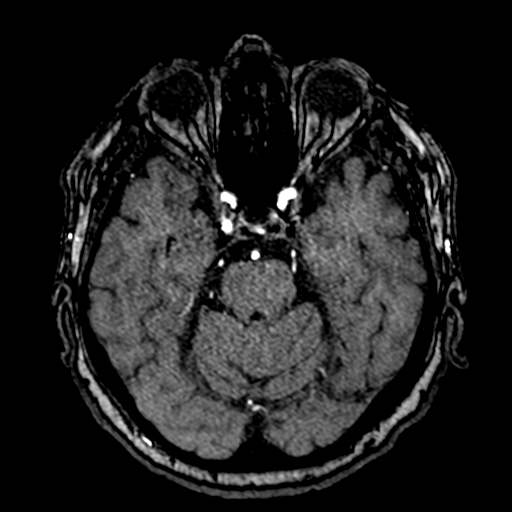
[im 119/172]
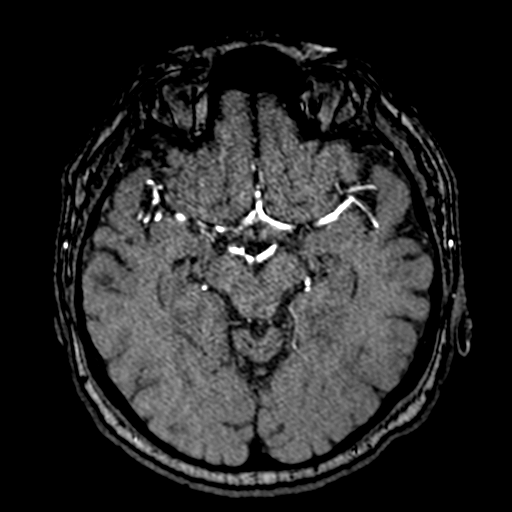
[im 142/172]
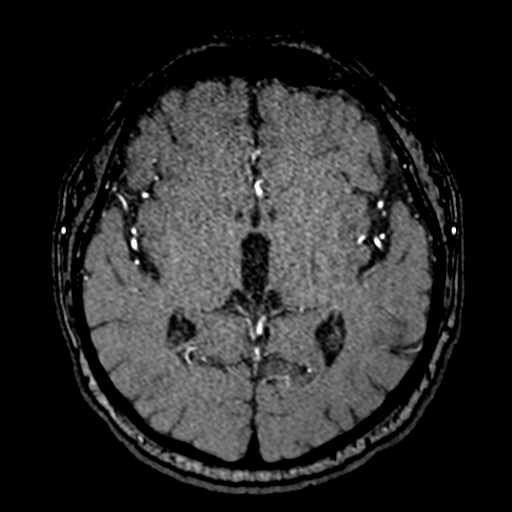
[im 145/172]
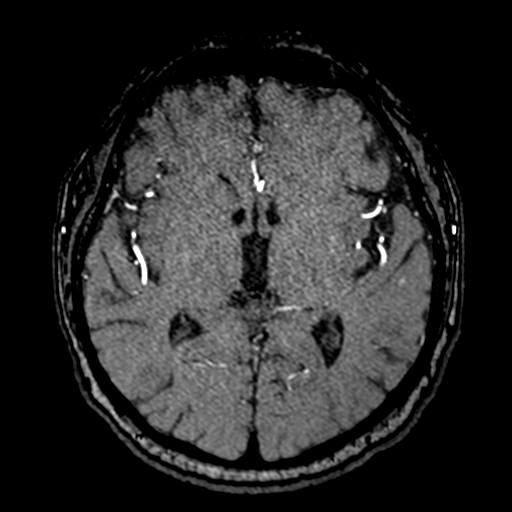
[im 164/172]
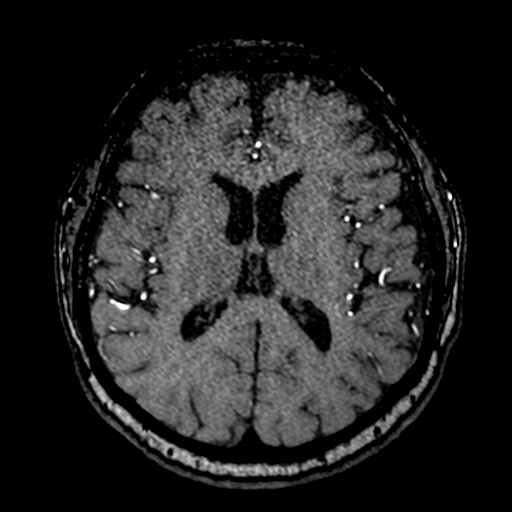

[Series 1030: mip tumble · axial · 0.4mm · 0.17mm/px · 1 of 2 slices shown]
[im 1/2]
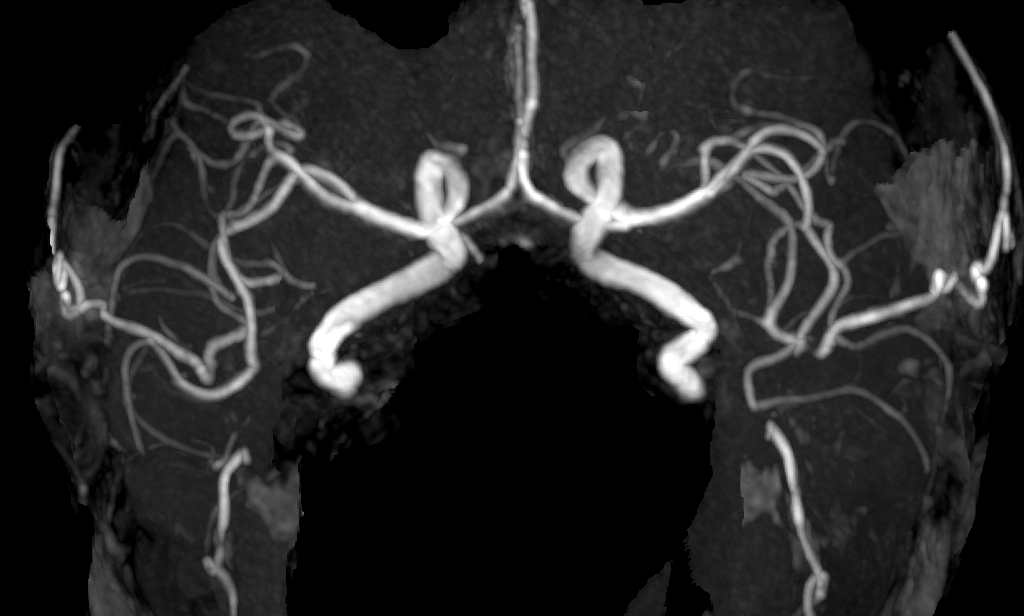

[17 of 48 positions shown; findings below may reference images not displayed]

FINDINGS: MRI HEAD FINDINGS

Brain: Mild diffuse prominence of the CSF containing spaces
compatible generalized age-related cerebral atrophy. Patchy T2/FLAIR
hyperintensity within the periventricular and deep white matter both
cerebral hemispheres most consistent with chronic small vessel
ischemic disease, mild in nature.

No abnormal foci of restricted diffusion to suggest acute or
subacute ischemia. Gray-white matter differentiation well
maintained. No encephalomalacia to suggest chronic cortical
infarction. No foci of susceptibility artifact to suggest acute or
chronic intracranial hemorrhage.

No mass lesion, midline shift or mass effect. Ventricles normal size
without hydrocephalus. No extra-axial fluid collection. Pituitary
gland suprasellar region normal. Midline structures intact.

Vascular: Major intracranial vascular flow voids are well
maintained.

Skull and upper cervical spine: Craniocervical junction within
normal limits. Bone marrow signal intensity normal. No scalp soft
tissue abnormality.

Sinuses/Orbits: Globes and orbital soft tissues within normal
limits. Paranasal sinuses are largely clear. Small bilateral mastoid
effusions noted, of doubtful significance. Inner ear structures
grossly normal.

Other: None.

MRA HEAD FINDINGS

ANTERIOR CIRCULATION:

Visualized distal cervical segments of the internal carotid arteries
are widely patent with symmetric antegrade flow. Petrous, cavernous,
and supraclinoid ICAs widely patent without stenosis or other
abnormality. A1 segments widely patent. Normal anterior
communicating artery complex. Anterior cerebral arteries patent to
their distal aspects without stenosis. No M1 stenosis or occlusion.
Normal right MCA bifurcation. Left MCA trifurcates. Distal MCA
branches well perfused and symmetric.

POSTERIOR CIRCULATION:

Both vertebral arteries patent to the vertebrobasilar junction
without stenosis. Right vertebral artery slightly dominant and
mildly tortuous crossing the midline prior to the vertebrobasilar
junction. Both picas patent. Basilar widely patent to its distal
aspect without stenosis. Superior cerebral arteries patent
bilaterally. Both PCAs primarily supplied via the basilar and are
well perfused to their distal aspects. Prominent right posterior
communicating artery noted.

No intracranial aneurysm or other vascular abnormality.
IMPRESSION: MRI HEAD IMPRESSION:

1. No acute intracranial abnormality.
2. Mild age-related cerebral atrophy with chronic small vessel
ischemic disease.

MRA HEAD IMPRESSION:

Normal intracranial MRA. No large vessel occlusion, hemodynamically
significant stenosis, or other acute vascular abnormality. No
aneurysm.

## 2020-03-30 IMAGING — CT CT HEAD W/O CM
3 series · 15 of 47 positions shown, 18 images · non-contrast
Comparison: None.

CLINICAL DATA: Possible stroke with left facial numbness since
yesterday which resolved.

EXAM:
CT HEAD WITHOUT CONTRAST
TECHNIQUE: Contiguous axial images were obtained from the base of the skull
through the vertex without intravenous contrast.

[Series 3: head 5.0 h30s · axial · 0.45mm/px · z∈[+1099,+1244]mm · 9 of 35 slices shown, 12 images]
[im 3/35  brain]
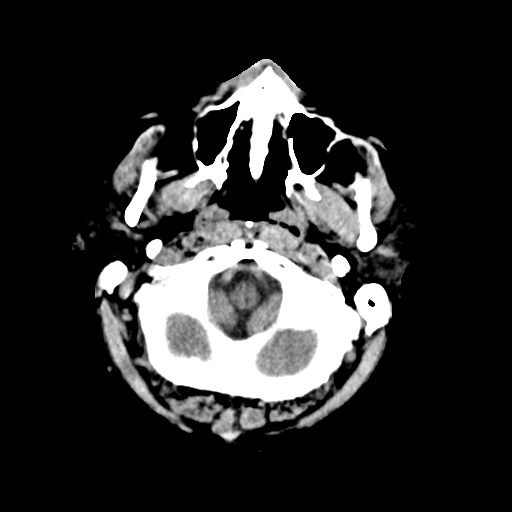
[im 3/35  bone]
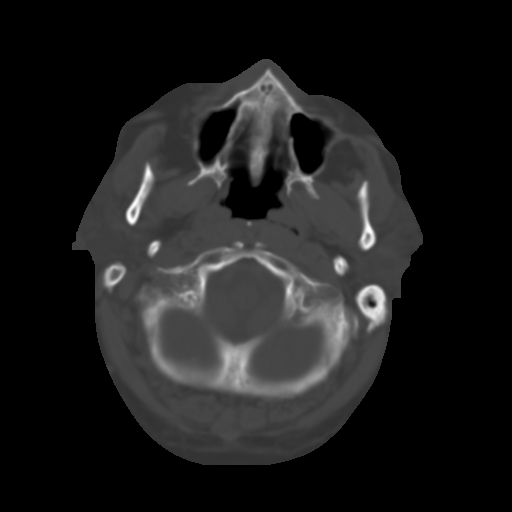
[im 6/35  brain]
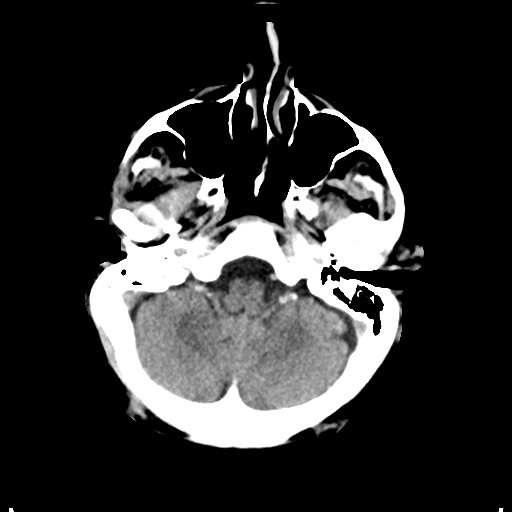
[im 10/35  brain]
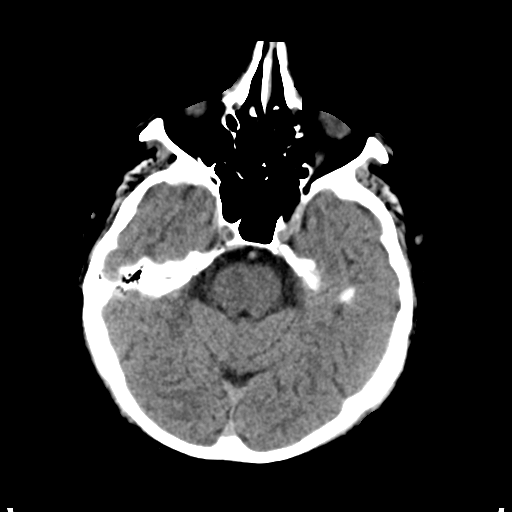
[im 13/35  brain]
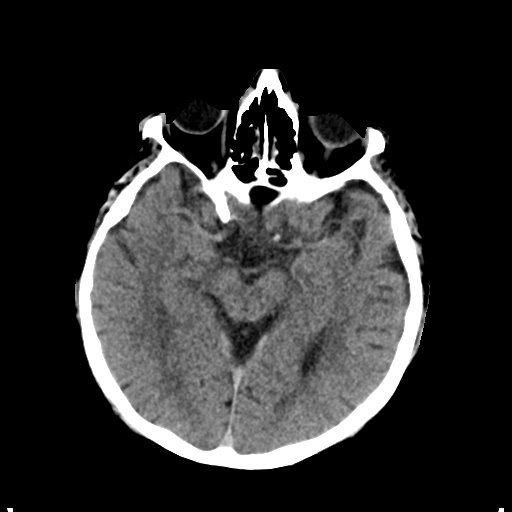
[im 18/35  brain]
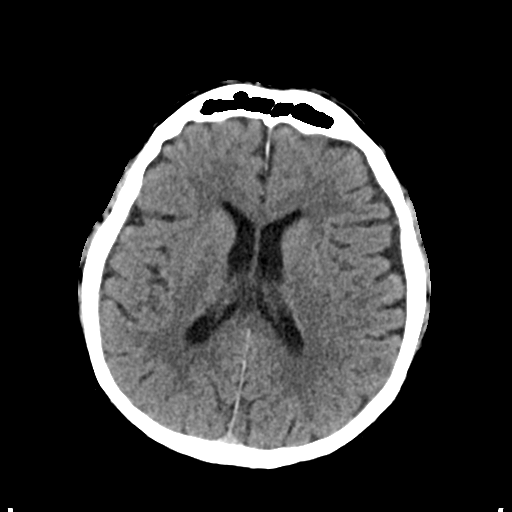
[im 18/35  bone]
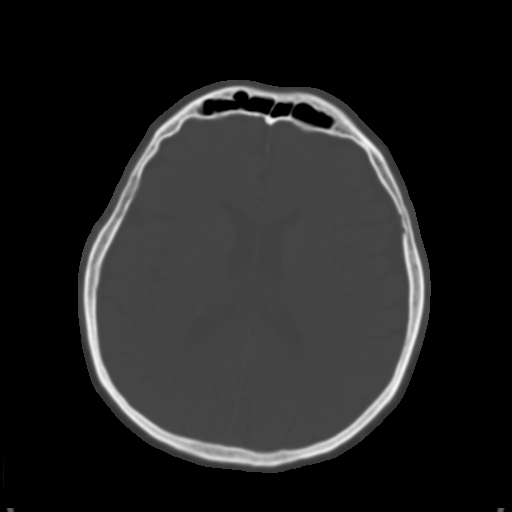
[im 22/35  brain]
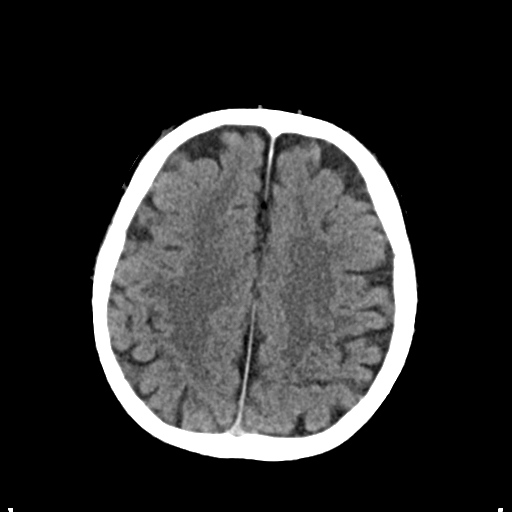
[im 25/35  brain]
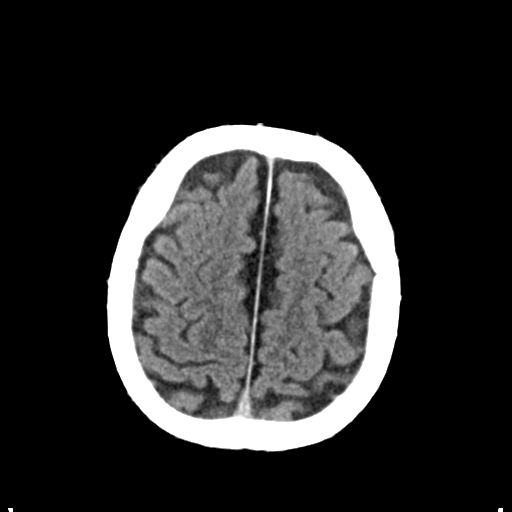
[im 29/35  brain]
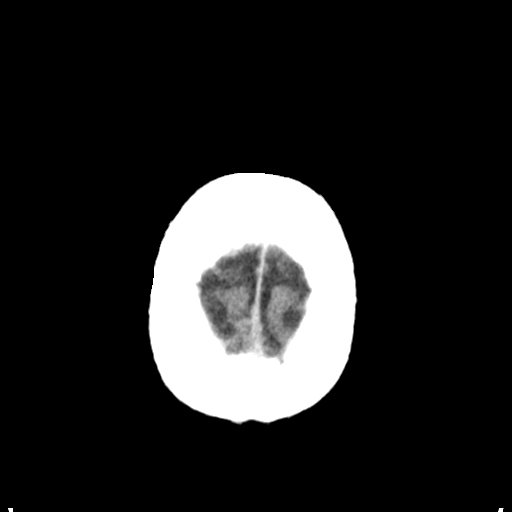
[im 32/35  brain]
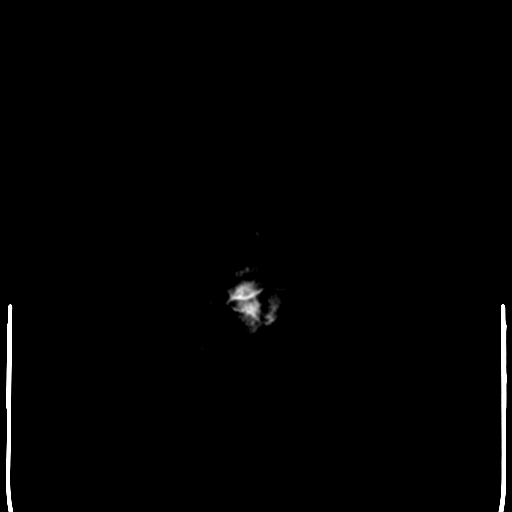
[im 32/35  bone]
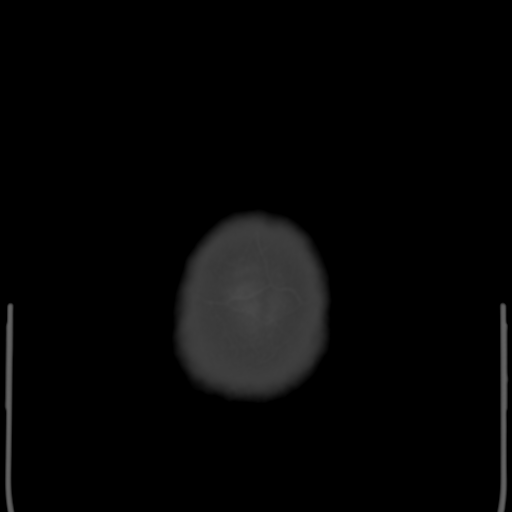

[Series 5: head 3.0 mpr cor · coronal · 0.36mm/px · 3 of 72 slices shown]
[im 24/72  brain]
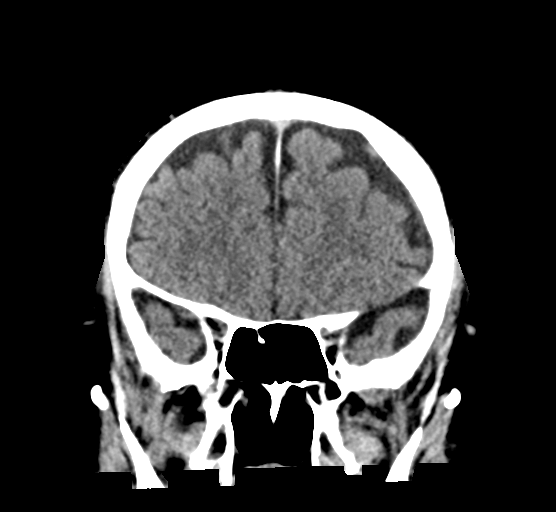
[im 32/72  brain]
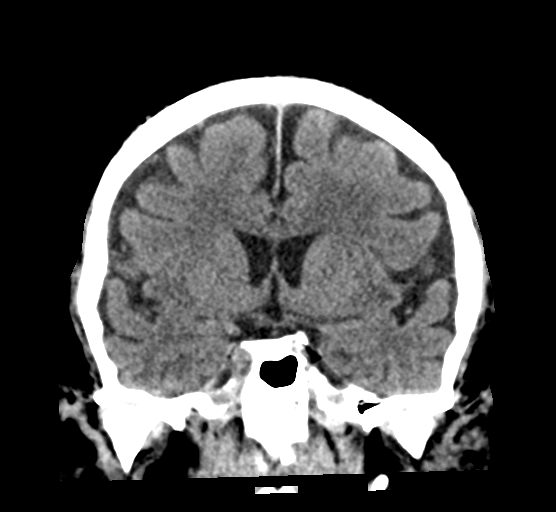
[im 40/72  brain]
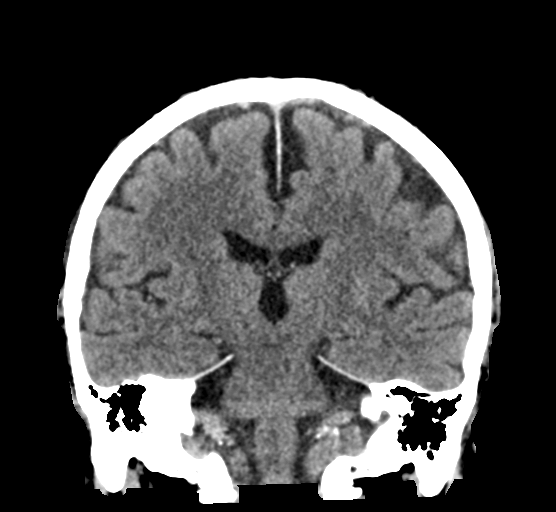

[Series 6: head 3.0 mpr sag · sagittal · 0.34mm/px · 3 of 67 slices shown]
[im 23/67  brain]
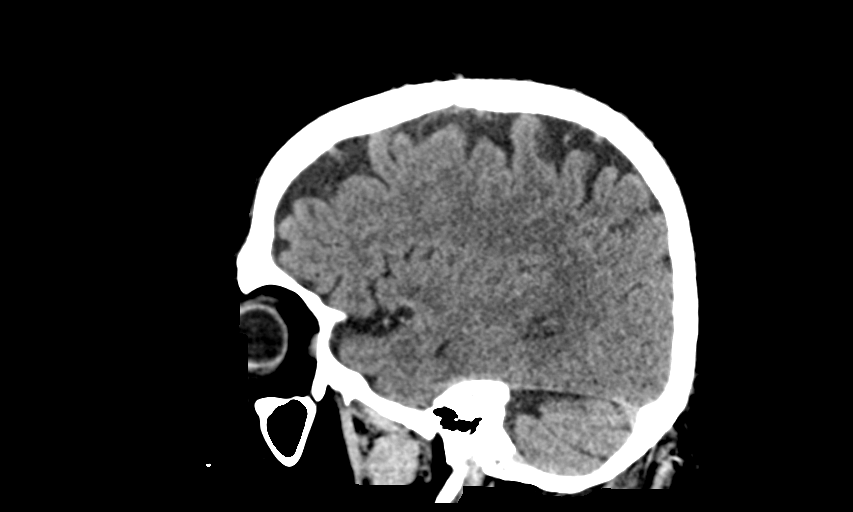
[im 34/67  brain]
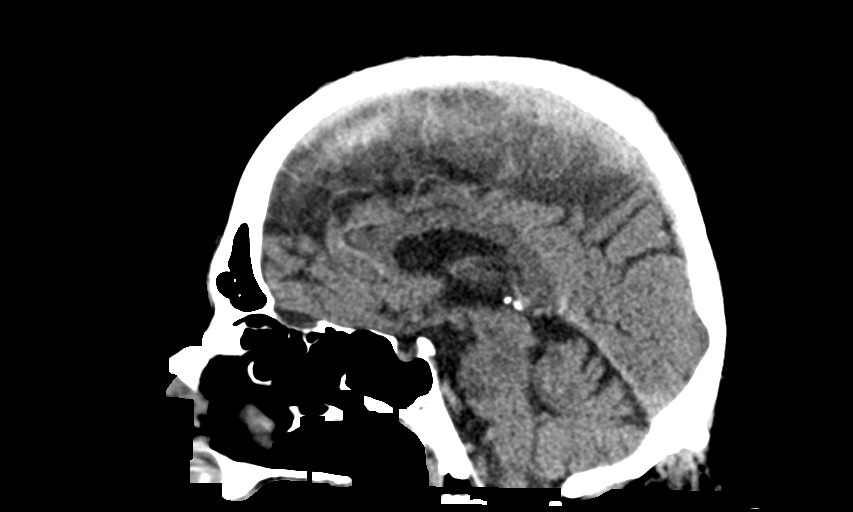
[im 45/67  brain]
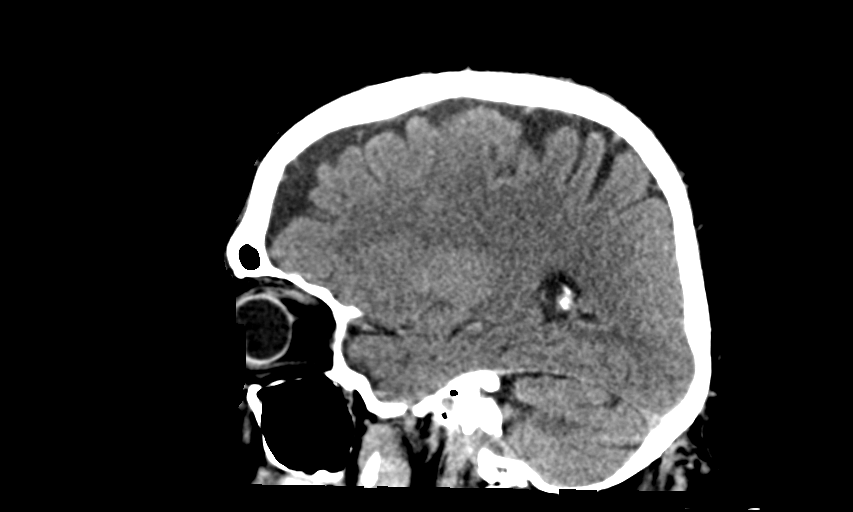

[15 of 47 positions shown; findings below may reference images not displayed]

FINDINGS: Brain: No evidence of acute infarction, hemorrhage, hydrocephalus,
extra-axial collection or mass lesion/mass effect.

Vascular: No hyperdense vessel or unexpected calcification.

Skull: Normal. Negative for fracture or focal lesion.

Sinuses/Orbits: No acute finding.

Other: None.
IMPRESSION: Normal head CT.

## 2020-03-30 IMAGING — MR MR HEAD W/O CM
12 of 13 series · 44 of 48 positions shown · non-contrast
Comparison: Comparison made with prior CT from earlier the same
day.

CLINICAL DATA: Initial evaluation for neuro deficit, stroke
suspected.

EXAM:
MRI HEAD WITHOUT CONTRAST
MRA HEAD WITHOUT CONTRAST
TECHNIQUE: Multiplanar, multiecho pulse sequences of the brain and surrounding
structures were obtained without intravenous contrast. Angiographic
images of the head were obtained using MRA technique without
contrast.

[Series 5: DWI · axial · 3.0mm · 0.88mm/px · z∈[-48,+96]mm · 8 of 100 slices shown (1 of 4)]
[im 1/100]
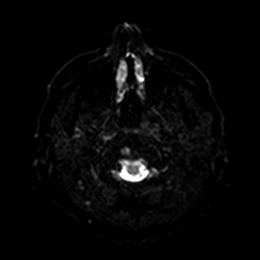
[im 15/100]
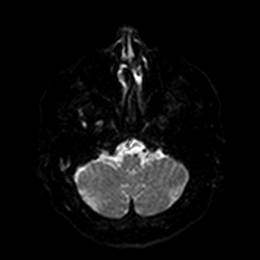
[im 29/100]
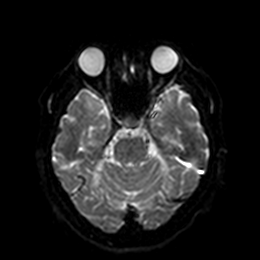
[im 43/100]
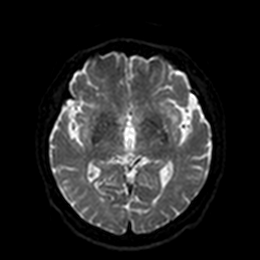
[im 57/100]
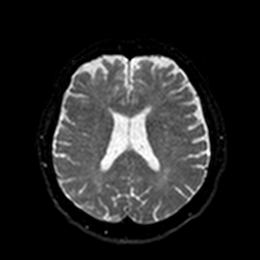
[im 71/100]
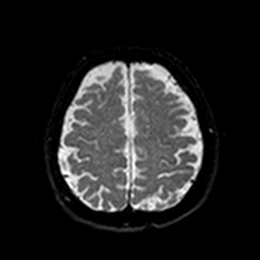
[im 85/100]
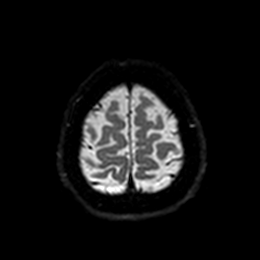
[im 100/100]
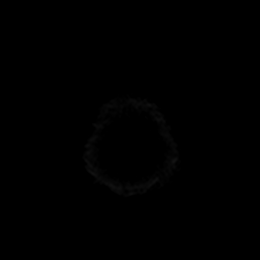

[Series 6: DWI · axial · 3.0mm · 0.88mm/px · z∈[-48,+96]mm · 4 of 50 slices shown (2 of 4)]
[im 1/50]
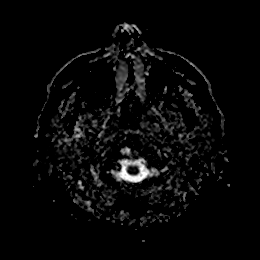
[im 17/50]
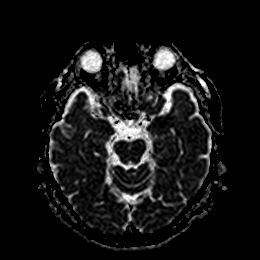
[im 33/50]
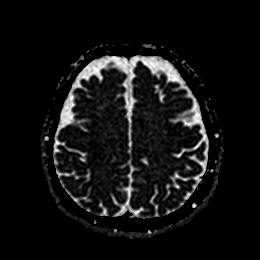
[im 50/50]
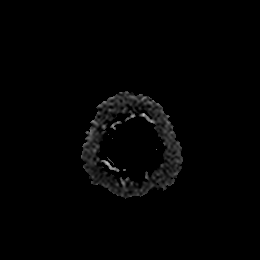

[Series 7: DWI · coronal · 4.0mm · 0.88mm/px · 6 of 72 slices shown (3 of 4)]
[im 1/72]
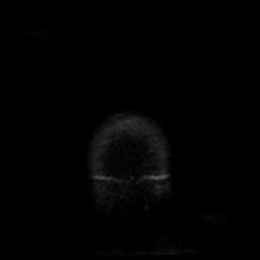
[im 15/72]
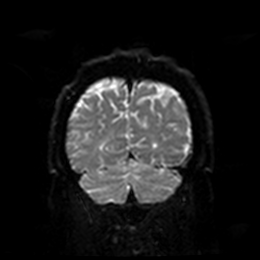
[im 29/72]
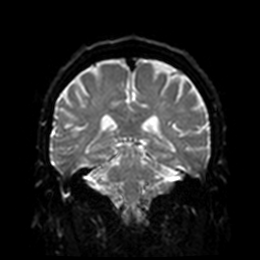
[im 43/72]
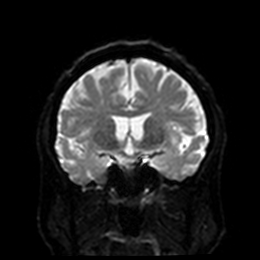
[im 57/72]
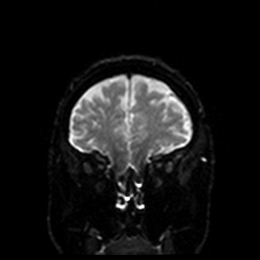
[im 72/72]
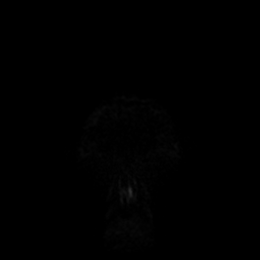

[Series 8: DWI · coronal · 4.0mm · 0.88mm/px · 3 of 36 slices shown (4 of 4)]
[im 1/36]
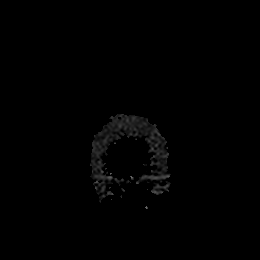
[im 18/36]
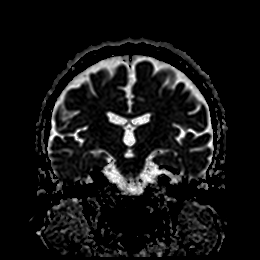
[im 36/36]
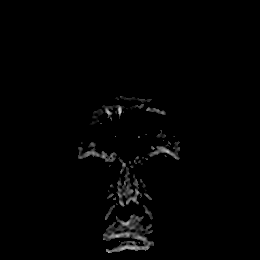

[Series 9: T1 · sagittal · 5.0mm · 0.75mm/px · 2 of 27 slices shown]
[im 1/27]
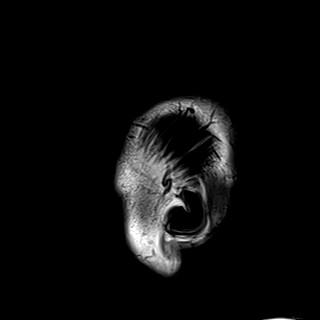
[im 27/27]
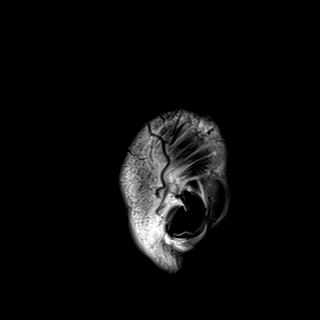

[Series 10: T2 · axial · 5.0mm · 0.72mm/px · z∈[-50,+102]mm · 2 of 27 slices shown (1 of 2)]
[im 1/27]
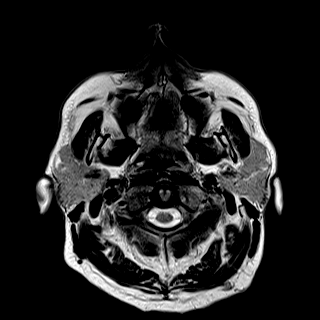
[im 27/27]
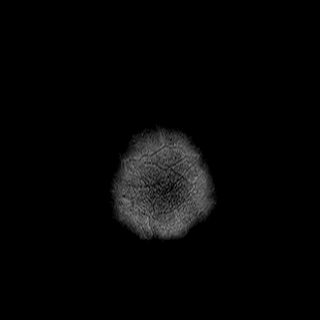

[Series 11: FLAIR · axial · 5.0mm · 0.45mm/px · z∈[-51,+100]mm · 2 of 27 slices shown]
[im 1/27]
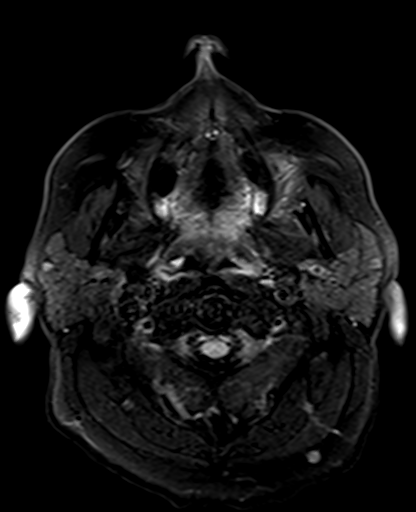
[im 27/27]
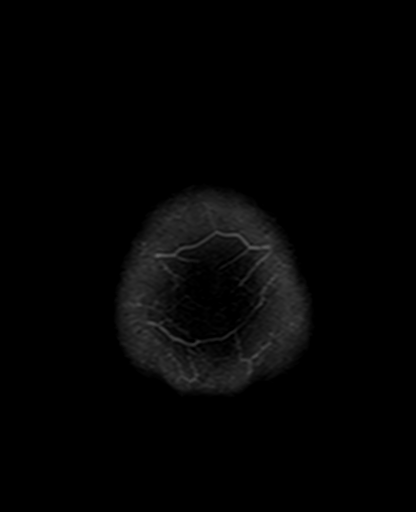

[Series 12: mag_images · axial · 3.0mm · 0.90mm/px · z∈[-56,+95]mm · 4 of 52 slices shown]
[im 1/52]
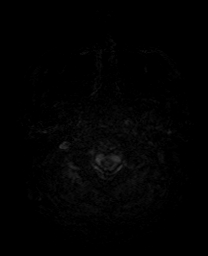
[im 18/52]
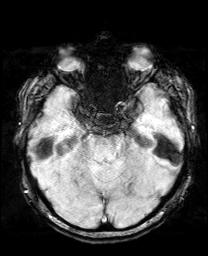
[im 35/52]
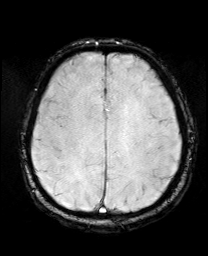
[im 52/52]
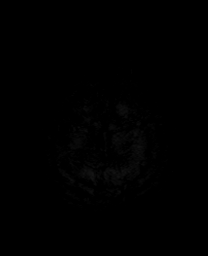

[Series 13: pha_images · axial · 3.0mm · 0.90mm/px · z∈[-56,+95]mm · 4 of 52 slices shown]
[im 1/52]
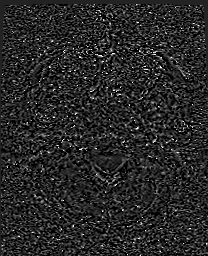
[im 18/52]
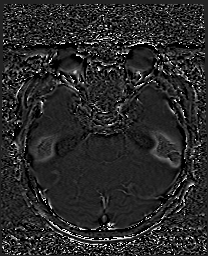
[im 35/52]
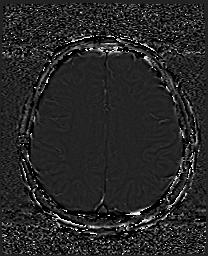
[im 52/52]
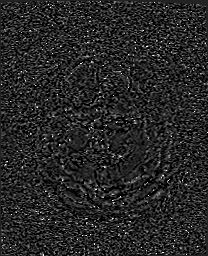

[Series 14: swi_images · axial · 3.0mm · 0.90mm/px · z∈[-56,+95]mm · 4 of 52 slices shown]
[im 1/52]
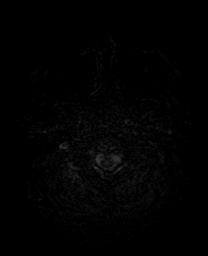
[im 18/52]
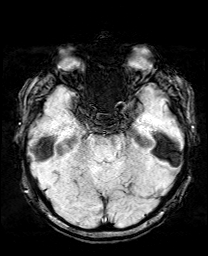
[im 35/52]
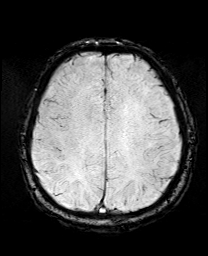
[im 52/52]
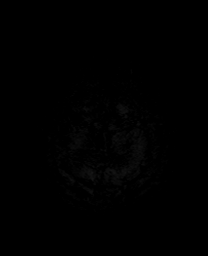

[Series 15: mip_images(sw) · axial · 24.0mm · 0.90mm/px · z∈[-45,+84]mm · 3 of 45 slices shown]
[im 1/45]
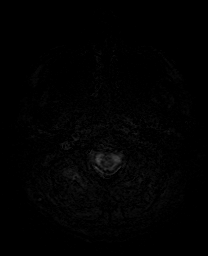
[im 23/45]
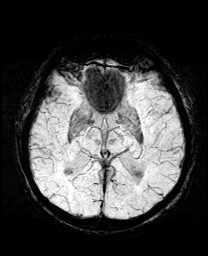
[im 45/45]
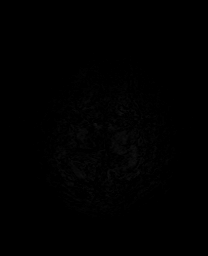

[Series 17: T2 · coronal · 5.0mm · 0.43mm/px · 2 of 29 slices shown (2 of 2)]
[im 1/29]
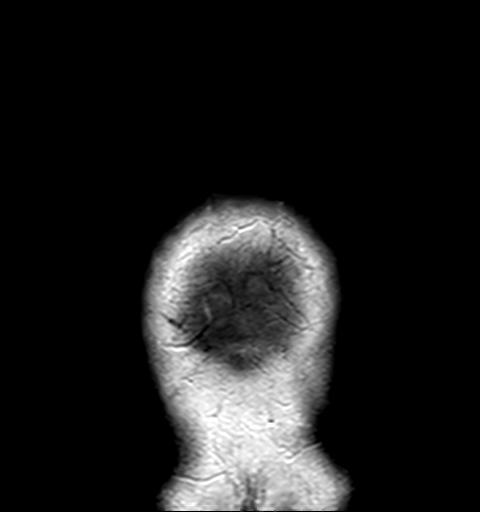
[im 29/29]
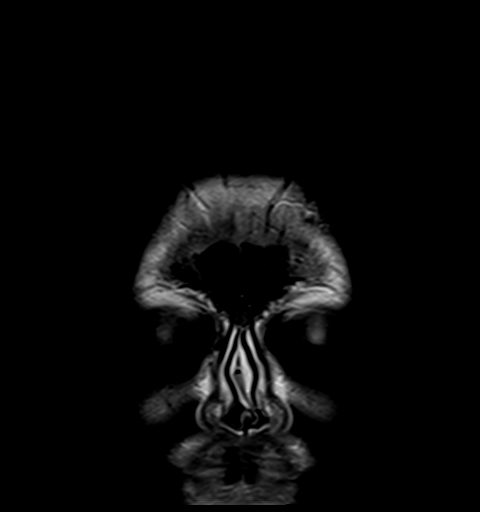

[44 of 48 positions shown; findings below may reference images not displayed]

FINDINGS: MRI HEAD FINDINGS

Brain: Mild diffuse prominence of the CSF containing spaces
compatible generalized age-related cerebral atrophy. Patchy T2/FLAIR
hyperintensity within the periventricular and deep white matter both
cerebral hemispheres most consistent with chronic small vessel
ischemic disease, mild in nature.

No abnormal foci of restricted diffusion to suggest acute or
subacute ischemia. Gray-white matter differentiation well
maintained. No encephalomalacia to suggest chronic cortical
infarction. No foci of susceptibility artifact to suggest acute or
chronic intracranial hemorrhage.

No mass lesion, midline shift or mass effect. Ventricles normal size
without hydrocephalus. No extra-axial fluid collection. Pituitary
gland suprasellar region normal. Midline structures intact.

Vascular: Major intracranial vascular flow voids are well
maintained.

Skull and upper cervical spine: Craniocervical junction within
normal limits. Bone marrow signal intensity normal. No scalp soft
tissue abnormality.

Sinuses/Orbits: Globes and orbital soft tissues within normal
limits. Paranasal sinuses are largely clear. Small bilateral mastoid
effusions noted, of doubtful significance. Inner ear structures
grossly normal.

Other: None.

MRA HEAD FINDINGS

ANTERIOR CIRCULATION:

Visualized distal cervical segments of the internal carotid arteries
are widely patent with symmetric antegrade flow. Petrous, cavernous,
and supraclinoid ICAs widely patent without stenosis or other
abnormality. A1 segments widely patent. Normal anterior
communicating artery complex. Anterior cerebral arteries patent to
their distal aspects without stenosis. No M1 stenosis or occlusion.
Normal right MCA bifurcation. Left MCA trifurcates. Distal MCA
branches well perfused and symmetric.

POSTERIOR CIRCULATION:

Both vertebral arteries patent to the vertebrobasilar junction
without stenosis. Right vertebral artery slightly dominant and
mildly tortuous crossing the midline prior to the vertebrobasilar
junction. Both picas patent. Basilar widely patent to its distal
aspect without stenosis. Superior cerebral arteries patent
bilaterally. Both PCAs primarily supplied via the basilar and are
well perfused to their distal aspects. Prominent right posterior
communicating artery noted.

No intracranial aneurysm or other vascular abnormality.
IMPRESSION: MRI HEAD IMPRESSION:

1. No acute intracranial abnormality.
2. Mild age-related cerebral atrophy with chronic small vessel
ischemic disease.

MRA HEAD IMPRESSION:

Normal intracranial MRA. No large vessel occlusion, hemodynamically
significant stenosis, or other acute vascular abnormality. No
aneurysm.

## 2020-03-30 MED ORDER — SODIUM CHLORIDE 0.9% FLUSH: 3.0000 mL | Freq: Once | INTRAVENOUS | Status: DC

## 2020-03-30 NOTE — ED Triage Notes (Signed)
Patient brought from [facility] for episode of left sided facial numbness, headache ad left eye blurriness while working in heat at [place] yesterday. On arrival alert and oriented, denies headache but complains of ongoing left eye blurriness. Patient has no weakness and speech clear.

## 2020-03-30 NOTE — ED Triage Notes (Signed)
Pt c/o severe HA, loss of vision in left eye, left-sided facial numbness acute onset yesterday at approx 1600. States HA has improved today, but loss of vision and facial numbness continues. ,Left grip weaker than right, left leg weaker than right. No arm drift.  Patient is being discharged from the Urgent Care and sent to the Emergency Department via [facility] Per T. Rozanna Box, NP patient is in need of higher level of care due to CVA possible. Patient is aware and verbalizes understanding of plan of care.  Vitals:   03/30/20 0959  BP: (!) 165/69  Pulse: 87  Resp: 18  Temp: 98.5 F (36.9 C)  SpO2: 97%

## 2020-03-30 NOTE — ED Triage Notes (Signed)
Pt arrives to ED w/ c/o vision loss in L eye. Pt was seen here earlier today and discharged after CT scan and told to f/u with eye doctor. Pt saw eye doctor today and was told that he needed to come back to the ED b/c he had an eye stroke.

## 2020-03-30 NOTE — Social Work (Signed)
TOC CSW consulted with pt via remotely.  CSW inquired to pt about the need for transportation.  Pt stated he would be fine to drive home, since he drove there by himself.  CSW asked pt to inform nurse if something changes and CSW would be happy to re-consult.  Please reconsult if new social work issues arise, CSW signing off.   Tarpley-Carter, MSW, [place] ED Transitions of CareClinical Social Worker [email or web address] [phone]

## 2020-03-30 NOTE — Care Management (Signed)
RNCM provided [facility] transportation to MD appointment ([place]) as pt has no transportation from hospital.

## 2020-03-30 NOTE — ED Notes (Signed)
Patient transported to CT 

## 2020-03-30 NOTE — Social Work (Signed)
Consult request has been received. CSW attempting to follow up at present time.  CSW will continue to follow for dc needs.   Tarpley-Carter, MSW, LCSW-A                  [place] ED Transitions of CareClinical Social Worker [email or web address] [phone] 

## 2020-03-30 NOTE — Discharge Instructions (Addendum)
Please go directly to Dr. Serita Grit office for evaluation of your vision loss.

## 2020-03-30 NOTE — ED Notes (Signed)
Pt verbalized understanding of discharge instructions. Follow up care and transport instructions reviewed, pt had no further questions.

## 2020-03-30 NOTE — Telephone Encounter (Signed)
Received call from patient stating that on his way home yesterday from [place] he starting having sudden numbness in face, vision loss and headache.  Patient is still having vision loss and some numbness to face.  BP reading 139/68 and 132/63.  Advised patient that he should be evaluated in the ER  No current openings here at this office.  Patient verbalized understanding.

## 2020-03-30 NOTE — ED Provider Notes (Signed)
[facility] DEPARTMENT Provider Note   CSN: [id number] Arrival date & time: 03/30/20  1015     History No chief complaint on file.   Derek Ryan is a 64 y.o. male.  HPI  64 yo male co left visual field deficit that began yesterday at 4 p.m.  He had some associated left headache which is resolved.  Patient is on aspirin but no other blood thinner.  He denies other lateralized deficits or generalized weakness.  He has had no prior stroke.  He is not diabetic.      Past Medical History:  Diagnosis Date  . Chronic diastolic heart failure ([place]) 08/25/2019  . Depression   . GERD (gastroesophageal reflux disease)   . Hyperlipidemia   . Hypertension   . LVH (left ventricular hypertrophy) due to hypertensive disease 08/25/2019    Patient Active Problem List   Diagnosis Date Noted  . Severe obstructive sleep apnea 12/13/2019  . 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 Chronic diastolic heart failure ([place]) 08/25/2019  . Hypertensive heart disease with chronic diastolic congestive heart failure ([place]) 08/25/2019  . Severe obesity (BMI 35.0-39.9) with comorbidity ([place]) 07/24/2018  . Moderate single current episode of major depressive disorder ([place]) 04/25/2016  . Elevated hemoglobin A1c 01/18/2016  . Dyslipidemia 01/12/2016  . Essential hypertension 01/12/2016  . Gastroesophageal reflux disease without esophagitis 01/12/2016  . Vitamin D deficiency 01/12/2016  . Anxiety, generalized 03/30/2014  . Hyperlipidemia 03/30/2014    History reviewed. No pertinent surgical history.     Family History  Problem Relation Age of Onset  . Heart attack Mother   . Alcohol abuse Father   . Stomach cancer Sister   . Hypertension Brother   . Brain cancer Brother     Social History   Tobacco Use  . Smoking status: Former Smoker    Packs/day: 1.00    Years: 23.00    Pack years: 23.00    Types: Cigarettes    Quit date: 1992      Years since quitting: 29.6  . Smokeless tobacco: Never Used  Vaping Use  . Vaping Use: Never used  Substance Use Topics  . Alcohol use: Not Currently    Comment: stopped in 1997  . Drug use: Never    Home Medications Prior to Admission medications   Medication Sig Start Date End Date Taking? Authorizing Provider  albuterol (VENTOLIN HFA) 108 (90 Base) MCG/ACT inhaler TAKE 2 PUFFS BY MOUTH EVERY 6 HOURS AS NEEDED FOR WHEEZE OR SHORTNESS OF BREATH Patient not taking: Reported on 12/13/2019 06/14/19   Terald Sleeper, PA-C  aspirin 81 MG chewable tablet Chew by mouth daily.    [provider]  atorvastatin (LIPITOR) 10 MG tablet TAKE 1 TABLET BY MOUTH EVERYDAY AT BEDTIME 12/13/19   Gottschalk, Ashly M, DO  busPIRone (BUSPAR) 10 MG tablet Take 1.5 tablets (15 mg total) by mouth 2 (two) times daily. For anxiety 12/13/19   Ronnie Doss M, DO  cholecalciferol (VITAMIN D3) 25 MCG (1000 UT) tablet Take 2,000 Units by mouth daily.    [provider]  escitalopram (LEXAPRO) 20 MG tablet Take 1 tablet (20 mg total) by mouth daily. 12/13/19   Janora Norlander, DO  losartan-hydrochlorothiazide (HYZAAR) 50-12.5 MG tablet Take 1 tablet by mouth daily. 07/19/19   Adrian Prows, MD  Omega-3 Fatty Acids (FISH OIL) 1000 MG CAPS Take by mouth.    [provider]  vitamin C (ASCORBIC  ACID) 500 MG tablet Take 500 mg by mouth daily.    [provider]  zinc sulfate 220 (50 Zn) MG capsule Take 220 mg by mouth daily.    [provider]    Allergies    Doxycycline  Review of Systems   Review of Systems  All other systems reviewed and are negative.   Physical Exam Updated Vital Signs BP (!) 154/112   Pulse 76   Temp 98.2 F (36.8 C) (Oral)   Resp 18   Ht 1.727 m (5\' 8" )   Wt 105.2 kg   SpO2 96%   BMI 35.28 kg/m   Physical Exam Vitals and nursing note reviewed.  Constitutional:      Appearance: Normal appearance. He is obese. He is not  ill-appearing.  HENT:     Head: Normocephalic.     Right Ear: External ear normal.     Left Ear: External ear normal.     Nose: Nose normal.     Mouth/Throat:     Mouth: Mucous membranes are moist.     Pharynx: Oropharynx is clear.  Eyes:     General: Lids are normal.        Right eye: No foreign body.        Left eye: No foreign body.     Extraocular Movements: Extraocular movements intact.     Right eye: Normal extraocular motion.     Left eye: Normal extraocular motion.     Conjunctiva/sclera: Conjunctivae normal.     Pupils: Pupils are equal, round, and reactive to light.     Visual Fields: Right eye visual fields normal.     Left eye: ABS in the lower nasal quadrant.     Comments: Blurring of vessels left lower lateral fundus- ? hemmorhage  Neurological:     Mental Status: He is alert.     ED Results / Procedures / Treatments   Labs (all labs ordered are listed, but only abnormal results are displayed) Labs Reviewed  COMPREHENSIVE METABOLIC PANEL - Abnormal; Notable for the following components:      Result Value   Potassium 3.4 (*)    Glucose, Bld 174 (*)    All other components within normal limits  CBG MONITORING, ED - Abnormal; Notable for the following components:   Glucose-Capillary 130 (*)    All other components within normal limits  PROTIME-INR  APTT  CBC  DIFFERENTIAL    EKG EKG Interpretation  Date/Time:  Thursday March 30 2020 10:22:22 EDT Ventricular Rate:  73 PR Interval:  160 QRS Duration: 132 QT Interval:  422 QTC Calculation: 464 R Axis:   89 Text Interpretation: Normal sinus rhythm Right bundle branch block Abnormal ECG Confirmed by Pattricia Boss [phone]) on 03/30/2020 10:29:39 AM   Radiology No results found.  Procedures Procedures (including critical care time)  Medications Ordered in ED Medications  sodium chloride flush (NS) 0.9 % injection 3 mL (has no administration in time range)    ED Course  I have reviewed the triage  vital signs and the nursing notes.  Pertinent labs & imaging results that were available during my care of the patient were reviewed by me and considered in my medical decision making (see chart for details).    MDM Rules/Calculators/A&P                          Patient with acute onset of visual field deficit yesterday.  Patient without  any evidence of stroke on CT and no other focal deficits on exam.  Patient care discussed with Dr. Posey Pronto.  Patient is to be transferred to Dr. Serita Grit office for further evaluation. Final Clinical Impression(s) / ED Diagnoses Final diagnoses:  Sudden visual loss of left eye    Rx / DC Orders ED Discharge Orders    None       Pattricia Boss, MD 03/30/20 [phone]

## 2020-03-30 NOTE — ED Provider Notes (Signed)
MSE was initiated and I personally evaluated the patient and placed orders (if any) at  6:50 PM on March 30, 2020.  Received call from triage regarding pt - initially seen this morning for sudden onset left vision loss that occurred at 4 PM yesterday afternoon. CT Head and remainder of stroke work up negative earlier today. 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 Have discussed case with neurologist; will place orders for specific imaging. Will add on COVID test. Do not feel pt needs additional lab work at this time.   The patient appears stable so that the remainder of the MSE may be completed by another provider.   Eustaquio Maize, PA-C 03/30/20 1852    Wyvonnia Dusky, MD 03/31/20 [phone]

## 2020-03-31 ENCOUNTER — Emergency Department (HOSPITAL_BASED_OUTPATIENT_CLINIC_OR_DEPARTMENT_OTHER): Payer: Medicare Other

## 2020-03-31 ENCOUNTER — Encounter (HOSPITAL_COMMUNITY): Payer: Self-pay | Admitting: Internal Medicine

## 2020-03-31 ENCOUNTER — Observation Stay (HOSPITAL_BASED_OUTPATIENT_CLINIC_OR_DEPARTMENT_OTHER): Payer: Medicare Other

## 2020-03-31 DIAGNOSIS — H3412 Central retinal artery occlusion, left eye: Principal | ICD-10-CM

## 2020-03-31 DIAGNOSIS — I10 Essential (primary) hypertension: Secondary | ICD-10-CM | POA: Insufficient documentation

## 2020-03-31 DIAGNOSIS — I6389 Other cerebral infarction: Secondary | ICD-10-CM | POA: Diagnosis not present

## 2020-03-31 LAB — TSH: TSH: 1.689 u[IU]/mL (ref 0.350–4.500)

## 2020-03-31 LAB — CBC WITH DIFFERENTIAL/PLATELET
Abs Immature Granulocytes: 0.02 10*3/uL (ref 0.00–0.07)
Basophils Absolute: 0.1 10*3/uL (ref 0.0–0.1)
Basophils Relative: 1 %
Eosinophils Absolute: 0.3 10*3/uL (ref 0.0–0.5)
Eosinophils Relative: 4 %
HCT: 41.1 % (ref 39.0–52.0)
Hemoglobin: 13.8 g/dL (ref 13.0–17.0)
Immature Granulocytes: 0 %
Lymphocytes Relative: 28 %
Lymphs Abs: 2.2 10*3/uL (ref 0.7–4.0)
MCH: 30.6 pg (ref 26.0–34.0)
MCHC: 33.6 g/dL (ref 30.0–36.0)
MCV: 91.1 fL (ref 80.0–100.0)
Monocytes Absolute: 1 10*3/uL (ref 0.1–1.0)
Monocytes Relative: 13 %
Neutro Abs: 4.2 10*3/uL (ref 1.7–7.7)
Neutrophils Relative %: 54 %
Platelets: 247 10*3/uL (ref 150–400)
RBC: 4.51 MIL/uL (ref 4.22–5.81)
RDW: 12.8 % (ref 11.5–15.5)
WBC: 7.9 10*3/uL (ref 4.0–10.5)
nRBC: 0 % (ref 0.0–0.2)

## 2020-03-31 LAB — COMPREHENSIVE METABOLIC PANEL
ALT: 24 U/L (ref 0–44)
AST: 22 U/L (ref 15–41)
Albumin: 4.1 g/dL (ref 3.5–5.0)
Alkaline Phosphatase: 63 U/L (ref 38–126)
Anion gap: 9 (ref 5–15)
BUN: 19 mg/dL (ref 8–23)
CO2: 29 mmol/L (ref 22–32)
Calcium: 9.3 mg/dL (ref 8.9–10.3)
Chloride: 100 mmol/L (ref 98–111)
Creatinine, Ser: 0.88 mg/dL (ref 0.61–1.24)
GFR calc Af Amer: >60 mL/min (ref >60)
GFR calc non Af Amer: >60 mL/min (ref >60)
Glucose, Bld: 119 mg/dL — ABNORMAL HIGH (ref 70–99)
Potassium: 3.7 mmol/L (ref 3.5–5.1)
Sodium: 138 mmol/L (ref 135–145)
Total Bilirubin: 1 mg/dL (ref 0.3–1.2)
Total Protein: 7 g/dL (ref 6.5–8.1)

## 2020-03-31 LAB — RAPID URINE DRUG SCREEN, HOSP PERFORMED
Amphetamines: NOT DETECTED
Barbiturates: NOT DETECTED
Benzodiazepines: NOT DETECTED
Cocaine: NOT DETECTED
Opiates: NOT DETECTED
Tetrahydrocannabinol: NOT DETECTED

## 2020-03-31 LAB — ECHOCARDIOGRAM COMPLETE
Area-P 1/2: 2.69 cm2
Height: 68 in
S' Lateral: 2.7 cm
Weight: 3712 oz

## 2020-03-31 LAB — SARS CORONAVIRUS 2 BY RT PCR (HOSPITAL ORDER, PERFORMED IN ~~LOC~~ HOSPITAL LAB): SARS Coronavirus 2: NEGATIVE

## 2020-03-31 LAB — HEMOGLOBIN A1C
Hgb A1c MFr Bld: 6.5 % — ABNORMAL HIGH (ref 4.8–5.6)
Mean Plasma Glucose: 139.85 mg/dL

## 2020-03-31 LAB — HIV ANTIBODY (ROUTINE TESTING W REFLEX): HIV Screen 4th Generation wRfx: NONREACTIVE

## 2020-03-31 LAB — CBG MONITORING, ED: Glucose-Capillary: 209 mg/dL — ABNORMAL HIGH (ref 70–99)

## 2020-03-31 MED ORDER — ASPIRIN EC 81 MG PO TBEC
81.0000 mg | DELAYED_RELEASE_TABLET | Freq: Every day | ORAL | Status: DC
  Administered 2020-03-31 – 2020-04-02 (×3): 81 mg via ORAL
  Filled 2020-03-31 (×3): qty 1

## 2020-03-31 MED ORDER — INSULIN ASPART 100 UNIT/ML ~~LOC~~ SOLN
0.0000 [IU] | Freq: Three times a day (TID) | SUBCUTANEOUS | Status: DC
  Administered 2020-03-31: 5 [IU] via SUBCUTANEOUS

## 2020-03-31 MED ORDER — LOSARTAN POTASSIUM 50 MG PO TABS
50.0000 mg | ORAL_TABLET | Freq: Every day | ORAL | Status: DC
  Administered 2020-03-31 – 2020-04-02 (×3): 50 mg via ORAL
  Filled 2020-03-31 (×3): qty 1

## 2020-03-31 MED ORDER — ONDANSETRON HCL 4 MG/2ML IJ SOLN: 4.0000 mg | Freq: Four times a day (QID) | INTRAMUSCULAR | Status: DC | PRN

## 2020-03-31 MED ORDER — STROKE: EARLY STAGES OF RECOVERY BOOK
Freq: Once | Status: DC
  Filled 2020-03-31: qty 1

## 2020-03-31 MED ORDER — LOSARTAN POTASSIUM-HCTZ 50-12.5 MG PO TABS: 1.0000 | ORAL_TABLET | Freq: Every day | ORAL | Status: DC

## 2020-03-31 MED ORDER — SENNOSIDES-DOCUSATE SODIUM 8.6-50 MG PO TABS: 1.0000 | ORAL_TABLET | Freq: Every evening | ORAL | Status: DC | PRN

## 2020-03-31 MED ORDER — ALBUTEROL SULFATE (2.5 MG/3ML) 0.083% IN NEBU: 3.0000 mL | INHALATION_SOLUTION | Freq: Four times a day (QID) | RESPIRATORY_TRACT | Status: DC | PRN

## 2020-03-31 MED ORDER — ACETAMINOPHEN 325 MG PO TABS: 650.0000 mg | ORAL_TABLET | ORAL | Status: DC | PRN

## 2020-03-31 MED ORDER — SODIUM CHLORIDE 0.9 % IV SOLN: INTRAVENOUS | Status: DC

## 2020-03-31 MED ORDER — BUSPIRONE HCL 10 MG PO TABS
15.0000 mg | ORAL_TABLET | Freq: Two times a day (BID) | ORAL | Status: DC
  Administered 2020-03-31 – 2020-04-02 (×5): 15 mg via ORAL
  Filled 2020-03-31 (×4): qty 2
  Filled 2020-03-31: qty 1

## 2020-03-31 MED ORDER — ACETAMINOPHEN 160 MG/5ML PO SOLN: 650.0000 mg | ORAL | Status: DC | PRN

## 2020-03-31 MED ORDER — HYDROCHLOROTHIAZIDE 12.5 MG PO CAPS
12.5000 mg | ORAL_CAPSULE | Freq: Every day | ORAL | Status: DC
  Administered 2020-03-31 – 2020-04-02 (×3): 12.5 mg via ORAL
  Filled 2020-03-31 (×3): qty 1

## 2020-03-31 MED ORDER — ENOXAPARIN SODIUM 40 MG/0.4ML ~~LOC~~ SOLN
40.0000 mg | SUBCUTANEOUS | Status: DC
  Administered 2020-03-31 – 2020-04-01 (×2): 40 mg via SUBCUTANEOUS
  Filled 2020-03-31 (×2): qty 0.4

## 2020-03-31 MED ORDER — ACETAMINOPHEN 650 MG RE SUPP: 650.0000 mg | RECTAL | Status: DC | PRN

## 2020-03-31 MED ORDER — ATORVASTATIN CALCIUM 10 MG PO TABS
10.0000 mg | ORAL_TABLET | Freq: Every day | ORAL | Status: DC
  Administered 2020-03-31 – 2020-04-01 (×2): 10 mg via ORAL
  Filled 2020-03-31 (×2): qty 1

## 2020-03-31 MED ORDER — ESCITALOPRAM OXALATE 10 MG PO TABS
20.0000 mg | ORAL_TABLET | Freq: Every day | ORAL | Status: DC
  Administered 2020-03-31 – 2020-04-02 (×3): 20 mg via ORAL
  Filled 2020-03-31 (×3): qty 2

## 2020-03-31 NOTE — H&P (Signed)
History and Physical    QUE MENEELY LGX:[id number] DOB: [birth year] DOA: 03/30/2020  PCP: Janora Norlander, DO Consultants:  Einar Gip - cardiology; [place] - sleep medicine/neurology Patient coming from:  Home - lives alone; NOK: Wyatt Haste, [phone]  Chief Complaint: Vision loss in L eye  HPI: Derek Ryan is a 64 y.o. male with medical history significant of HTN; HLD; and chronic diastolic CHF presenting with vision loss in L eye.  Wednesday afternoon he was at work (works outside at [employer], it was [employer]) and felt very hot.  He got ready to leave and noticed acute left frontal headache with numbness lateral to the left eye.  He developed acute left eye blurriness.  He went home but symptoms were still present and unchanged Thursday (still the same now).  He came to the ER yesterday and he had a CT which was negative.  He was sent to ophthalmology (Dr. Posey Pronto) and he did further evaluation and diagnosed central retinal artery occlusion.  He does not anticipate that the vision will return.  Back in January, he had a bunch of tests by Dr. Einar Gip - stress tests, cardiac MRI.  Had [place] in October with CHF.  Now, he is not sure whether he needs the tests again.     ED Course:  Central retinal occlusion, seen yesterday.  MRI/MRA negative.  Needs stroke eval.  Neurology consult pending.  Review of Systems: As per HPI; otherwise review of systems reviewed and negative.   Ambulatory Status:  Ambulates without assistance  COVID Vaccine Status:  Complete  Past Medical History:  Diagnosis Date  . Central retinal artery occlusion of left eye   . Chronic diastolic heart failure ([place]) 08/25/2019  . Depression   . GERD (gastroesophageal reflux disease)   . Hyperlipidemia   . Hypertension   . 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 OSA (obstructive sleep apnea)    unable to tolerate CPAP    History reviewed. No pertinent surgical history.  Social  History   Socioeconomic History  . Marital status: Widowed    Spouse name: Not on file  . Number of children: 3  . Years of education: Not on file  . Highest education level: Not on file  Occupational History  . 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 Smoking status: Former Smoker    Packs/day: 1.00    Years: 23.00    Pack years: 23.00    Types: Cigarettes    Quit date: 1992    Years since quitting: 29.6  . Smokeless tobacco: Never Used  Vaping Use  . Vaping Use: Never used  Substance and Sexual Activity  . Alcohol use: Not Currently    Comment: stopped in 1997  . Drug use: Never  . Sexual activity: Not on file  Other Topics Concern  . Not on file  Social History Narrative  . Not on file   Social Determinants of Health   Financial Resource Strain:   . Difficulty of Paying Living Expenses:   Food Insecurity:   . Worried About Charity fundraiser in the Last Year:   . Arboriculturist in the Last Year:   Transportation Needs:   . Film/video editor (Medical):   [name] Lack of Transportation (Non-Medical):   Physical Activity:   . Days of Exercise per Week:   . Minutes of Exercise per Session:   Stress:   . Feeling of Stress :  Social Connections:   . Frequency of Communication with Friends and Family:   . Frequency of Social Gatherings with Friends and Family:   . Attends Religious Services:   . Active Member of Clubs or Organizations:   . Attends Archivist Meetings:   [name] Marital Status:   Intimate Partner Violence:   . Fear of Current or Ex-Partner:   . Emotionally Abused:   [name] Physically Abused:   . Sexually Abused:     Allergies  Allergen Reactions  . Doxycycline Itching    As of 03/31/20, pt does not recall this reaction    Family History  Problem Relation Age of Onset  . Heart attack Mother   . Alcohol abuse Father   . Stomach cancer Sister   . Hypertension Brother   . Brain cancer Brother     Prior to Admission medications    Medication Sig Start Date End Date Taking? Authorizing Provider  albuterol (VENTOLIN HFA) 108 (90 Base) MCG/ACT inhaler TAKE 2 PUFFS BY MOUTH EVERY 6 HOURS AS NEEDED FOR WHEEZE OR SHORTNESS OF BREATH Patient taking differently: Inhale 2 puffs into the lungs every 6 (six) hours as needed for wheezing or shortness of breath.  06/14/19  Yes Terald Sleeper, PA-C  aspirin EC 81 MG tablet Take 81 mg by mouth daily.   Yes [provider]  atorvastatin (LIPITOR) 10 MG tablet TAKE 1 TABLET BY MOUTH EVERYDAY AT BEDTIME Patient taking differently: Take 10 mg by mouth at bedtime.  12/13/19  Yes Gottschalk, Ashly M, DO  busPIRone (BUSPAR) 10 MG tablet Take 1.5 tablets (15 mg total) by mouth 2 (two) times daily. For anxiety 12/13/19  Yes Gottschalk, Leatrice Jewels M, DO  cholecalciferol (VITAMIN D3) 25 MCG (1000 UT) tablet Take 2,000 Units by mouth daily.   Yes [provider]  escitalopram (LEXAPRO) 20 MG tablet Take 1 tablet (20 mg total) by mouth daily. 12/13/19  Yes Gottschalk, Ashly M, DO  losartan-hydrochlorothiazide (HYZAAR) 50-12.5 MG tablet Take 1 tablet by mouth daily. 07/19/19  Yes Adrian Prows, MD  Omega-3 Fatty Acids (FISH OIL) 1000 MG CAPS Take by mouth.   Yes [provider]  vitamin C (ASCORBIC ACID) 500 MG tablet Take 500 mg by mouth daily.   Yes [provider]  zinc sulfate 220 (50 Zn) MG capsule Take 220 mg by mouth daily.   Yes [provider]    Physical Exam: Vitals:   03/31/20 1231 03/31/20 1410 03/31/20 1430 03/31/20 1500  BP:  115/62 (!) 131/54 (!) 134/58  Pulse: 80 74 76 73  Resp: 14 16 14 15   Temp:      TempSrc:      SpO2: 98% 95% 96% 95%     . General:  Appears calm and comfortable and is NAD . Eyes:  PERRL, EOMI, normal lids, iris; Left visual field defect from 10-12 o'clock . ENT:  grossly normal hearing, lips & tongue, mmm; appropriate dentition . Neck:  no LAD, masses or thyromegaly; no carotid bruits . Cardiovascular:  RRR, no  m/r/g. No LE edema.  [name] Respiratory:   CTA bilaterally with no wheezes/rales/rhonchi.  Normal respiratory effort. . Abdomen:  soft, NT, ND, NABS . Back:   normal alignment, no CVAT . Skin:  no rash or induration seen on limited exam . Musculoskeletal:  grossly normal tone BUE/BLE, good ROM, no bony abnormality . Psychiatric:  grossly normal mood and affect, speech fluent and appropriate, AOx3 . Neurologic:  CN 2-12 grossly intact,  moves all extremities in coordinated fashion    Radiological Exams on Admission: CT HEAD WO CONTRAST  Result Date: 03/30/2020 CLINICAL DATA:  Possible stroke with left facial numbness since yesterday which resolved. EXAM: CT HEAD WITHOUT CONTRAST TECHNIQUE: Contiguous axial images were obtained from the base of the skull through the vertex without intravenous contrast. COMPARISON:  None. FINDINGS: Brain: No evidence of acute infarction, hemorrhage, hydrocephalus, extra-axial collection or mass lesion/mass effect. Vascular: No hyperdense vessel or unexpected calcification. Skull: Normal. Negative for fracture or focal lesion. Sinuses/Orbits: No acute finding. Other: None. IMPRESSION: Normal head CT. Electronically Signed   By: Marin Olp M.D.   On: 03/30/2020 13:37   MR ANGIO HEAD WO CONTRAST  Result Date: 03/30/2020 CLINICAL DATA:  Initial evaluation for neuro deficit, stroke suspected. EXAM: MRI HEAD WITHOUT CONTRAST MRA HEAD WITHOUT CONTRAST TECHNIQUE: Multiplanar, multiecho pulse sequences of the brain and surrounding structures were obtained without intravenous contrast. Angiographic images of the head were obtained using MRA technique without contrast. COMPARISON:  Comparison made with prior CT from earlier the same day. FINDINGS: MRI HEAD FINDINGS Brain: Mild diffuse prominence of the CSF containing spaces compatible generalized age-related cerebral atrophy. Patchy T2/FLAIR hyperintensity within the periventricular and deep white matter both cerebral hemispheres  most consistent with chronic small vessel ischemic disease, mild in nature. No abnormal foci of restricted diffusion to suggest acute or subacute ischemia. Gray-white matter differentiation well maintained. No encephalomalacia to suggest chronic cortical infarction. No foci of susceptibility artifact to suggest acute or chronic intracranial hemorrhage. No mass lesion, midline shift or mass effect. Ventricles normal size without hydrocephalus. No extra-axial fluid collection. Pituitary gland suprasellar region normal. Midline structures intact. Vascular: Major intracranial vascular flow voids are well maintained. Skull and upper cervical spine: Craniocervical junction within normal limits. Bone marrow signal intensity normal. No scalp soft tissue abnormality. Sinuses/Orbits: Globes and orbital soft tissues within normal limits. Paranasal sinuses are largely clear. Small bilateral mastoid effusions noted, of doubtful significance. Inner ear structures grossly normal. Other: None. MRA HEAD FINDINGS ANTERIOR CIRCULATION: Visualized distal cervical segments of the internal carotid arteries are widely patent with symmetric antegrade flow. Petrous, cavernous, and supraclinoid ICAs widely patent without stenosis or other abnormality. A1 segments widely patent. Normal anterior communicating artery complex. Anterior cerebral arteries patent to their distal aspects without stenosis. No M1 stenosis or occlusion. Normal right MCA bifurcation. Left MCA trifurcates. Distal MCA branches well perfused and symmetric. POSTERIOR CIRCULATION: Both vertebral arteries patent to the vertebrobasilar junction without stenosis. Right vertebral artery slightly dominant and mildly tortuous crossing the midline prior to the vertebrobasilar junction. Both picas patent. Basilar widely patent to its distal aspect without stenosis. Superior cerebral arteries patent bilaterally. Both PCAs primarily supplied via the basilar and are well perfused to  their distal aspects. Prominent right posterior communicating artery noted. No intracranial aneurysm or other vascular abnormality. IMPRESSION: MRI HEAD IMPRESSION: 1. No acute intracranial abnormality. 2. Mild age-related cerebral atrophy with chronic small vessel ischemic disease. MRA HEAD IMPRESSION: Normal intracranial MRA. No large vessel occlusion, hemodynamically significant stenosis, or other acute vascular abnormality. No aneurysm. Electronically Signed   By: Jeannine Boga M.D.   On: 03/30/2020 21:47   MR BRAIN WO CONTRAST  Result Date: 03/30/2020 CLINICAL DATA:  Initial evaluation for neuro deficit, stroke suspected. EXAM: MRI HEAD WITHOUT CONTRAST MRA HEAD WITHOUT CONTRAST TECHNIQUE: Multiplanar, multiecho pulse sequences of the brain and surrounding structures were obtained without intravenous contrast. Angiographic images of the head were obtained using MRA technique  without contrast. COMPARISON:  Comparison made with prior CT from earlier the same day. FINDINGS: MRI HEAD FINDINGS Brain: Mild diffuse prominence of the CSF containing spaces compatible generalized age-related cerebral atrophy. Patchy T2/FLAIR hyperintensity within the periventricular and deep white matter both cerebral hemispheres most consistent with chronic small vessel ischemic disease, mild in nature. No abnormal foci of restricted diffusion to suggest acute or subacute ischemia. Gray-white matter differentiation well maintained. No encephalomalacia to suggest chronic cortical infarction. No foci of susceptibility artifact to suggest acute or chronic intracranial hemorrhage. No mass lesion, midline shift or mass effect. Ventricles normal size without hydrocephalus. No extra-axial fluid collection. Pituitary gland suprasellar region normal. Midline structures intact. Vascular: Major intracranial vascular flow voids are well maintained. Skull and upper cervical spine: Craniocervical junction within normal limits. Bone marrow  signal intensity normal. No scalp soft tissue abnormality. Sinuses/Orbits: Globes and orbital soft tissues within normal limits. Paranasal sinuses are largely clear. Small bilateral mastoid effusions noted, of doubtful significance. Inner ear structures grossly normal. Other: None. MRA HEAD FINDINGS ANTERIOR CIRCULATION: Visualized distal cervical segments of the internal carotid arteries are widely patent with symmetric antegrade flow. Petrous, cavernous, and supraclinoid ICAs widely patent without stenosis or other abnormality. A1 segments widely patent. Normal anterior communicating artery complex. Anterior cerebral arteries patent to their distal aspects without stenosis. No M1 stenosis or occlusion. Normal right MCA bifurcation. Left MCA trifurcates. Distal MCA branches well perfused and symmetric. POSTERIOR CIRCULATION: Both vertebral arteries patent to the vertebrobasilar junction without stenosis. Right vertebral artery slightly dominant and mildly tortuous crossing the midline prior to the vertebrobasilar junction. Both picas patent. Basilar widely patent to its distal aspect without stenosis. Superior cerebral arteries patent bilaterally. Both PCAs primarily supplied via the basilar and are well perfused to their distal aspects. Prominent right posterior communicating artery noted. No intracranial aneurysm or other vascular abnormality. IMPRESSION: MRI HEAD IMPRESSION: 1. No acute intracranial abnormality. 2. Mild age-related cerebral atrophy with chronic small vessel ischemic disease. MRA HEAD IMPRESSION: Normal intracranial MRA. No large vessel occlusion, hemodynamically significant stenosis, or other acute vascular abnormality. No aneurysm. Electronically Signed   By: Jeannine Boga M.D.   On: 03/30/2020 21:47   VAS US CAROTID (at [facility] and [facility] only)  Result Date: 03/31/2020 Carotid Arterial Duplex Study Indications:       Central retinal artery occlusion. Risk Factors:      Hypertension,  hyperlipidemia. Comparison Study:  no prior Performing Technologist: Abram Sander RVS  Examination Guidelines: A complete evaluation includes B-mode imaging, spectral Doppler, color Doppler, and power Doppler as needed of all accessible portions of each vessel. Bilateral testing is considered an integral part of a complete examination. Limited examinations for reoccurring indications may be performed as noted.  Right Carotid Findings: +----------+--------+--------+--------+------------------+--------+           PSV cm/sEDV cm/sStenosisPlaque DescriptionComments +----------+--------+--------+--------+------------------+--------+ CCA Prox  130     14              heterogenous               +----------+--------+--------+--------+------------------+--------+ CCA Distal101     19              heterogenous               +----------+--------+--------+--------+------------------+--------+ ICA Prox  86      11      1-39%   heterogenous               +----------+--------+--------+--------+------------------+--------+ ICA Distal98  21                                         +----------+--------+--------+--------+------------------+--------+ ECA       171     10                                         +----------+--------+--------+--------+------------------+--------+ +----------+--------+-------+--------+-------------------+           PSV cm/sEDV cmsDescribeArm Pressure (mmHG) +----------+--------+-------+--------+-------------------+ [id number]                                        +----------+--------+-------+--------+-------------------+ +---------+--------+--+--------+-+---------+ VertebralPSV cm/s39EDV cm/s7Antegrade +---------+--------+--+--------+-+---------+  Left Carotid Findings: +----------+--------+--------+--------+------------------+--------+           PSV cm/sEDV cm/sStenosisPlaque DescriptionComments  +----------+--------+--------+--------+------------------+--------+ CCA Prox  118     26              heterogenous               +----------+--------+--------+--------+------------------+--------+ CCA Distal99      17              heterogenous               +----------+--------+--------+--------+------------------+--------+ ICA Prox  106     25      1-39%   heterogenous               +----------+--------+--------+--------+------------------+--------+ ICA Distal85      17                                         +----------+--------+--------+--------+------------------+--------+ ECA       224                                                +----------+--------+--------+--------+------------------+--------+ +----------+--------+--------+--------+-------------------+           PSV cm/sEDV cm/sDescribeArm Pressure (mmHG) +----------+--------+--------+--------+-------------------+ [id number]                                         +----------+--------+--------+--------+-------------------+ +---------+--------+--+--------+-+---------+ VertebralPSV cm/s40EDV cm/s9Antegrade +---------+--------+--+--------+-+---------+   Summary: Right Carotid: Velocities in the right ICA are consistent with a 1-39% stenosis. Left Carotid: Velocities in the left ICA are consistent with a 1-39% stenosis. Vertebrals: Bilateral vertebral arteries demonstrate antegrade flow. *See table(s) above for measurements and observations.     Preliminary     EKG: Independently reviewed.  NSR with rate 70; RBBB with no evidence of acute ischemia   Labs on Admission: I have personally reviewed the available labs and imaging studies at the time of the admission.  Pertinent labs from 8/12:   K+ 3.4 Glucose 174 Normal CBC ETOH negative UA WNL UDS negative   Assessment/Plan Principal Problem:   Central retinal artery occlusion of left eye Active Problems:   Anxiety, generalized    Dyslipidemia   Essential hypertension   Severe obesity (BMI 35.0-39.9) with comorbidity (  [place])   Chronic diastolic heart failure (HCC)   Severe obstructive sleep apnea   Central retinal artery occlusion of L eye -Patient presented to the ER yesterday after previous day with acute onset of left frontal headache, facial numbness, and visual field defect -CT and MRI/MRA negative and so patient was referred to outpatient ophthalmology -Evaluation at eye doctor today confirmed L central retinal artery occlusion and he was sent back in for further stroke evaluation -Will place in observation status for further evaluation -Telemetry monitoring -Carotid dopplers -Echo -Risk stratification with FLP, A1c; will also check TSH and UDS -ASA daily -Neurology consult -PT/OT/ST/Nutrition Consults  HTN -Out of the window for permissive HTN -Continue Hyzaar   HLD -Check FLP -Continue Lipitor at current dose but consider increasing based on LDL goal <70   DM -Check A1c - prior was 6.0 -Based on controlled DM without medications prior, he is not on home medications -Will order moderate-scale SSI  Anxiety -Continue Buspar and Lexapro -He occasionally wakes up with severe panic attacks; while most likely related to underlying anxiety, PAF is a consideration and placement of loop recorder may be reasonable at some point  Chronic diastolic CHF -Last checked in 05/2019 with preserved EF but severe LVH -Subsequent cardiac MRI ruled out infiltrative cardiomyopathy -Repeat echo is pending today  OSA -Unable to tolerate CPAP  Obesity -BMI 35.28 -Weight loss should be encouraged -Outpatient PCP/bariatric medicine f/u encouraged      Note: This patient has been tested and is negative for the novel coronavirus COVID-19.    DVT prophylaxis:  Lovenox  Code Status: Limited - confirmed with patient/family Family Communication: Celesta Gentile present throughout evaluation Disposition Plan:  The patient  is from: home  Anticipated d/c is to: home without [facility] services   Anticipated d/c date will depend on clinical response to treatment, but possibly as early as tomorrow if he has excellent response to treatment  Patient is currently: acutely ill Consults called: Neurology; PT/OT/ST/Nutrition  Admission status: It is my clinical opinion that referral for OBSERVATION is reasonable and necessary in this patient based on the above information provided. The aforementioned taken together are felt to place the patient at high risk for further clinical deterioration. However it is anticipated that the patient may be medically stable for discharge from the hospital within 24 to 48 hours.      Karmen Bongo MD [facility]   How to contact the [facility] Attending or Consulting provider [place] or covering provider during after hours [place], for this patient?  1. Check the care team in [facility] and look for a) attending/consulting [facility] provider listed and b) the [facility] team listed 2. Log into [email or web address] and use [place]'s universal password to access. If you do not have the password, please contact the hospital operator. 3. Locate the [facility] provider you are looking for under [facility] and page to a number that you can be directly reached. 4. If you still have difficulty reaching the provider, please page the [facility] (Director on Call) for the Hospitalists listed on amion for assistance.   03/31/2020, 4:00 PM

## 2020-03-31 NOTE — Progress Notes (Signed)
Pt transferred to another floor for further management.

## 2020-03-31 NOTE — Progress Notes (Signed)
Carotid duplex has been completed.   Preliminary results in [facility] Proc.   Abram Sander 03/31/2020 3:16 PM

## 2020-03-31 NOTE — ED Provider Notes (Signed)
[facility] EMERGENCY DEPARTMENT Provider Note   CSN: [id number] Arrival date & time: 03/30/20  1813     History Chief Complaint  Patient presents with  . Eye Problem    PROMISE BUSHONG is a 64 y.o. male.  64 year old male presents with sudden onset of left eye vision loss 2 days ago.  Was seen here in the ED and had a stroke work-up which was negative and referred back to ophthalmology.  Seen by Dr. Posey Pronto who visualized a left-sided central retinal artery occlusion.  Was sent back to the ED for work-up.  Patient had medical screening exam performed and advanced imaging was ordered.  Patient denies any headache or any focal neurological findings.        Past Medical History:  Diagnosis Date  . Chronic diastolic heart failure ([place]) 08/25/2019  . Depression   . GERD (gastroesophageal reflux disease)   . Hyperlipidemia   . Hypertension   . LVH (left ventricular hypertrophy) due to hypertensive disease 08/25/2019    Patient Active Problem List   Diagnosis Date Noted  . Severe obstructive sleep apnea 12/13/2019  . 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 Chronic diastolic heart failure ([place]) 08/25/2019  . Hypertensive heart disease with chronic diastolic congestive heart failure ([place]) 08/25/2019  . Severe obesity (BMI 35.0-39.9) with comorbidity ([place]) 07/24/2018  . Moderate single current episode of major depressive disorder ([place]) 04/25/2016  . Elevated hemoglobin A1c 01/18/2016  . Dyslipidemia 01/12/2016  . Essential hypertension 01/12/2016  . Gastroesophageal reflux disease without esophagitis 01/12/2016  . Vitamin D deficiency 01/12/2016  . Anxiety, generalized 03/30/2014  . Hyperlipidemia 03/30/2014    History reviewed. No pertinent surgical history.     Family History  Problem Relation Age of Onset  . Heart attack Mother   . Alcohol abuse Father   . Stomach cancer Sister   . Hypertension Brother   . Brain  cancer Brother     Social History   Tobacco Use  . Smoking status: Former Smoker    Packs/day: 1.00    Years: 23.00    Pack years: 23.00    Types: Cigarettes    Quit date: 1992    Years since quitting: 29.6  . Smokeless tobacco: Never Used  Vaping Use  . Vaping Use: Never used  Substance Use Topics  . Alcohol use: Not Currently    Comment: stopped in 1997  . Drug use: Never    Home Medications Prior to Admission medications   Medication Sig Start Date End Date Taking? Authorizing Provider  albuterol (VENTOLIN HFA) 108 (90 Base) MCG/ACT inhaler TAKE 2 PUFFS BY MOUTH EVERY 6 HOURS AS NEEDED FOR WHEEZE OR SHORTNESS OF BREATH Patient taking differently: Inhale 2 puffs into the lungs every 6 (six) hours as needed for wheezing or shortness of breath.  06/14/19  Yes Terald Sleeper, PA-C  aspirin EC 81 MG tablet Take 81 mg by mouth daily.   Yes [provider]  atorvastatin (LIPITOR) 10 MG tablet TAKE 1 TABLET BY MOUTH EVERYDAY AT BEDTIME Patient taking differently: Take 10 mg by mouth at bedtime.  12/13/19  Yes Gottschalk, Ashly M, DO  busPIRone (BUSPAR) 10 MG tablet Take 1.5 tablets (15 mg total) by mouth 2 (two) times daily. For anxiety 12/13/19  Yes Gottschalk, Leatrice Jewels M, DO  cholecalciferol (VITAMIN D3) 25 MCG (1000 UT) tablet Take 2,000 Units by mouth daily.   Yes [provider]  escitalopram (LEXAPRO) 20 MG  tablet Take 1 tablet (20 mg total) by mouth daily. 12/13/19  Yes Gottschalk, Ashly M, DO  losartan-hydrochlorothiazide (HYZAAR) 50-12.5 MG tablet Take 1 tablet by mouth daily. 07/19/19  Yes Adrian Prows, MD  Omega-3 Fatty Acids (FISH OIL) 1000 MG CAPS Take by mouth.   Yes [provider]  vitamin C (ASCORBIC ACID) 500 MG tablet Take 500 mg by mouth daily.   Yes [provider]  zinc sulfate 220 (50 Zn) MG capsule Take 220 mg by mouth daily.   Yes [provider]    Allergies    Doxycycline  Review of Systems   Review of Systems  All  other systems reviewed and are negative.   Physical Exam Updated Vital Signs BP (!) 141/66   Pulse 65   Temp 98.2 F (36.8 C) (Oral)   Resp 16   SpO2 92%   Physical Exam Vitals and nursing note reviewed.  Constitutional:      General: He is not in acute distress.    Appearance: Normal appearance. He is well-developed. He is not toxic-appearing.  HENT:     Head: Normocephalic and atraumatic.  Eyes:     General: Lids are normal.     Conjunctiva/sclera: Conjunctivae normal.     Pupils: Pupils are equal, round, and reactive to light.     Comments: Left eye visual field defect from 10-12 o'clock  Neck:     Thyroid: No thyroid mass.     Trachea: No tracheal deviation.  Cardiovascular:     Rate and Rhythm: Normal rate and regular rhythm.     Heart sounds: Normal heart sounds. No murmur heard.  No gallop.   Pulmonary:     Effort: Pulmonary effort is normal. No respiratory distress.     Breath sounds: Normal breath sounds. No stridor. No decreased breath sounds, wheezing, rhonchi or rales.  Abdominal:     General: Bowel sounds are normal. There is no distension.     Palpations: Abdomen is soft.     Tenderness: There is no abdominal tenderness. There is no rebound.  Musculoskeletal:        General: No tenderness. Normal range of motion.     Cervical back: Normal range of motion and neck supple.  Skin:    General: Skin is warm and dry.     Findings: No abrasion or rash.  Neurological:     Mental Status: He is alert and oriented to person, place, and time.     GCS: GCS eye subscore is 4. GCS verbal subscore is 5. GCS motor subscore is 6.     Cranial Nerves: No cranial nerve deficit.     Sensory: No sensory deficit.  Psychiatric:        Speech: Speech normal.        Behavior: Behavior normal.     ED Results / Procedures / Treatments   Labs (all labs ordered are listed, but only abnormal results are displayed) Labs Reviewed  SARS CORONAVIRUS 2 BY RT PCR ([place],  [place] [facility])    EKG None  Radiology CT HEAD WO CONTRAST  Result Date: 03/30/2020 CLINICAL DATA:  Possible stroke with left facial numbness since yesterday which resolved. EXAM: CT HEAD WITHOUT CONTRAST TECHNIQUE: Contiguous axial images were obtained from the base of the skull through the vertex without intravenous contrast. COMPARISON:  None. FINDINGS: Brain: No evidence of acute infarction, hemorrhage, hydrocephalus, extra-axial collection or mass lesion/mass effect. Vascular: No hyperdense vessel or unexpected  calcification. Skull: Normal. Negative for fracture or focal lesion. Sinuses/Orbits: No acute finding. Other: None. IMPRESSION: Normal head CT. Electronically Signed   By: Marin Olp M.D.   On: 03/30/2020 13:37   MR ANGIO HEAD WO CONTRAST  Result Date: 03/30/2020 CLINICAL DATA:  Initial evaluation for neuro deficit, stroke suspected. EXAM: MRI HEAD WITHOUT CONTRAST MRA HEAD WITHOUT CONTRAST TECHNIQUE: Multiplanar, multiecho pulse sequences of the brain and surrounding structures were obtained without intravenous contrast. Angiographic images of the head were obtained using MRA technique without contrast. COMPARISON:  Comparison made with prior CT from earlier the same day. FINDINGS: MRI HEAD FINDINGS Brain: Mild diffuse prominence of the CSF containing spaces compatible generalized age-related cerebral atrophy. Patchy T2/FLAIR hyperintensity within the periventricular and deep white matter both cerebral hemispheres most consistent with chronic small vessel ischemic disease, mild in nature. No abnormal foci of restricted diffusion to suggest acute or subacute ischemia. Gray-white matter differentiation well maintained. No encephalomalacia to suggest chronic cortical infarction. No foci of susceptibility artifact to suggest acute or chronic intracranial hemorrhage. No mass lesion, midline shift or mass effect. Ventricles normal size without hydrocephalus. No  extra-axial fluid collection. Pituitary gland suprasellar region normal. Midline structures intact. Vascular: Major intracranial vascular flow voids are well maintained. Skull and upper cervical spine: Craniocervical junction within normal limits. Bone marrow signal intensity normal. No scalp soft tissue abnormality. Sinuses/Orbits: Globes and orbital soft tissues within normal limits. Paranasal sinuses are largely clear. Small bilateral mastoid effusions noted, of doubtful significance. Inner ear structures grossly normal. Other: None. MRA HEAD FINDINGS ANTERIOR CIRCULATION: Visualized distal cervical segments of the internal carotid arteries are widely patent with symmetric antegrade flow. Petrous, cavernous, and supraclinoid ICAs widely patent without stenosis or other abnormality. A1 segments widely patent. Normal anterior communicating artery complex. Anterior cerebral arteries patent to their distal aspects without stenosis. No M1 stenosis or occlusion. Normal right MCA bifurcation. Left MCA trifurcates. Distal MCA branches well perfused and symmetric. POSTERIOR CIRCULATION: Both vertebral arteries patent to the vertebrobasilar junction without stenosis. Right vertebral artery slightly dominant and mildly tortuous crossing the midline prior to the vertebrobasilar junction. Both picas patent. Basilar widely patent to its distal aspect without stenosis. Superior cerebral arteries patent bilaterally. Both PCAs primarily supplied via the basilar and are well perfused to their distal aspects. Prominent right posterior communicating artery noted. No intracranial aneurysm or other vascular abnormality. IMPRESSION: MRI HEAD IMPRESSION: 1. No acute intracranial abnormality. 2. Mild age-related cerebral atrophy with chronic small vessel ischemic disease. MRA HEAD IMPRESSION: Normal intracranial MRA. No large vessel occlusion, hemodynamically significant stenosis, or other acute vascular abnormality. No aneurysm.  Electronically Signed   By: Jeannine Boga M.D.   On: 03/30/2020 21:47   MR BRAIN WO CONTRAST  Result Date: 03/30/2020 CLINICAL DATA:  Initial evaluation for neuro deficit, stroke suspected. EXAM: MRI HEAD WITHOUT CONTRAST MRA HEAD WITHOUT CONTRAST TECHNIQUE: Multiplanar, multiecho pulse sequences of the brain and surrounding structures were obtained without intravenous contrast. Angiographic images of the head were obtained using MRA technique without contrast. COMPARISON:  Comparison made with prior CT from earlier the same day. FINDINGS: MRI HEAD FINDINGS Brain: Mild diffuse prominence of the CSF containing spaces compatible generalized age-related cerebral atrophy. Patchy T2/FLAIR hyperintensity within the periventricular and deep white matter both cerebral hemispheres most consistent with chronic small vessel ischemic disease, mild in nature. No abnormal foci of restricted diffusion to suggest acute or subacute ischemia. Gray-white matter differentiation well maintained. No encephalomalacia to suggest chronic cortical infarction. No  foci of susceptibility artifact to suggest acute or chronic intracranial hemorrhage. No mass lesion, midline shift or mass effect. Ventricles normal size without hydrocephalus. No extra-axial fluid collection. Pituitary gland suprasellar region normal. Midline structures intact. Vascular: Major intracranial vascular flow voids are well maintained. Skull and upper cervical spine: Craniocervical junction within normal limits. Bone marrow signal intensity normal. No scalp soft tissue abnormality. Sinuses/Orbits: Globes and orbital soft tissues within normal limits. Paranasal sinuses are largely clear. Small bilateral mastoid effusions noted, of doubtful significance. Inner ear structures grossly normal. Other: None. MRA HEAD FINDINGS ANTERIOR CIRCULATION: Visualized distal cervical segments of the internal carotid arteries are widely patent with symmetric antegrade flow.  Petrous, cavernous, and supraclinoid ICAs widely patent without stenosis or other abnormality. A1 segments widely patent. Normal anterior communicating artery complex. Anterior cerebral arteries patent to their distal aspects without stenosis. No M1 stenosis or occlusion. Normal right MCA bifurcation. Left MCA trifurcates. Distal MCA branches well perfused and symmetric. POSTERIOR CIRCULATION: Both vertebral arteries patent to the vertebrobasilar junction without stenosis. Right vertebral artery slightly dominant and mildly tortuous crossing the midline prior to the vertebrobasilar junction. Both picas patent. Basilar widely patent to its distal aspect without stenosis. Superior cerebral arteries patent bilaterally. Both PCAs primarily supplied via the basilar and are well perfused to their distal aspects. Prominent right posterior communicating artery noted. No intracranial aneurysm or other vascular abnormality. IMPRESSION: MRI HEAD IMPRESSION: 1. No acute intracranial abnormality. 2. Mild age-related cerebral atrophy with chronic small vessel ischemic disease. MRA HEAD IMPRESSION: Normal intracranial MRA. No large vessel occlusion, hemodynamically significant stenosis, or other acute vascular abnormality. No aneurysm. Electronically Signed   By: Jeannine Boga M.D.   On: 03/30/2020 21:47    Procedures Procedures (including critical care time)  Medications Ordered in ED Medications - No data to display  ED Course  I have reviewed the triage vital signs and the nursing notes.  Pertinent labs & imaging results that were available during my care of the patient were reviewed by me and considered in my medical decision making (see chart for details).    MDM Rules/Calculators/A&P                          Patient's imaging results noted and discussed with Dr. Posey Pronto on-call for ophthalmology.  Recommends inpatient admission for stroke work-up.  Will consult neurology as well as hospitalist  service Final Clinical Impression(s) / ED Diagnoses Final diagnoses:  None    Rx / DC Orders ED Discharge Orders    None       Lacretia Leigh, MD 03/31/20 1124

## 2020-03-31 NOTE — ED Notes (Signed)
2 sst and 1 dark green sent to lab as extras.

## 2020-03-31 NOTE — Progress Notes (Signed)
°  Echocardiogram 2D Echocardiogram has been performed.  Derek Ryan 03/31/2020, 4:07 PM

## 2020-04-01 ENCOUNTER — Other Ambulatory Visit: Payer: Self-pay | Admitting: Cardiology

## 2020-04-01 DIAGNOSIS — H5340 Unspecified visual field defects: Secondary | ICD-10-CM | POA: Diagnosis present

## 2020-04-01 DIAGNOSIS — E785 Hyperlipidemia, unspecified: Secondary | ICD-10-CM

## 2020-04-01 DIAGNOSIS — I10 Essential (primary) hypertension: Secondary | ICD-10-CM

## 2020-04-01 DIAGNOSIS — H3412 Central retinal artery occlusion, left eye: Secondary | ICD-10-CM | POA: Diagnosis not present

## 2020-04-01 DIAGNOSIS — I5032 Chronic diastolic (congestive) heart failure: Secondary | ICD-10-CM | POA: Diagnosis not present

## 2020-04-01 DIAGNOSIS — F411 Generalized anxiety disorder: Secondary | ICD-10-CM | POA: Diagnosis present

## 2020-04-01 DIAGNOSIS — Z8249 Family history of ischemic heart disease and other diseases of the circulatory system: Secondary | ICD-10-CM | POA: Diagnosis not present

## 2020-04-01 DIAGNOSIS — H349 Unspecified retinal vascular occlusion: Secondary | ICD-10-CM

## 2020-04-01 DIAGNOSIS — Z20822 Contact with and (suspected) exposure to covid-19: Secondary | ICD-10-CM | POA: Diagnosis present

## 2020-04-01 DIAGNOSIS — I4510 Unspecified right bundle-branch block: Secondary | ICD-10-CM | POA: Diagnosis present

## 2020-04-01 DIAGNOSIS — Z87891 Personal history of nicotine dependence: Secondary | ICD-10-CM | POA: Diagnosis not present

## 2020-04-01 DIAGNOSIS — Z8616 Personal history of COVID-19: Secondary | ICD-10-CM | POA: Diagnosis not present

## 2020-04-01 DIAGNOSIS — Z7982 Long term (current) use of aspirin: Secondary | ICD-10-CM | POA: Diagnosis not present

## 2020-04-01 DIAGNOSIS — E1169 Type 2 diabetes mellitus with other specified complication: Secondary | ICD-10-CM | POA: Diagnosis present

## 2020-04-01 DIAGNOSIS — G4733 Obstructive sleep apnea (adult) (pediatric): Secondary | ICD-10-CM | POA: Diagnosis present

## 2020-04-01 DIAGNOSIS — I110 Hypertensive heart disease with heart failure: Secondary | ICD-10-CM | POA: Diagnosis present

## 2020-04-01 DIAGNOSIS — K219 Gastro-esophageal reflux disease without esophagitis: Secondary | ICD-10-CM | POA: Diagnosis present

## 2020-04-01 DIAGNOSIS — Z6835 Body mass index (BMI) 35.0-35.9, adult: Secondary | ICD-10-CM | POA: Diagnosis not present

## 2020-04-01 DIAGNOSIS — F329 Major depressive disorder, single episode, unspecified: Secondary | ICD-10-CM | POA: Diagnosis present

## 2020-04-01 DIAGNOSIS — Z79899 Other long term (current) drug therapy: Secondary | ICD-10-CM | POA: Diagnosis not present

## 2020-04-01 DIAGNOSIS — Z881 Allergy status to other antibiotic agents status: Secondary | ICD-10-CM | POA: Diagnosis not present

## 2020-04-01 DIAGNOSIS — H5462 Unqualified visual loss, left eye, normal vision right eye: Secondary | ICD-10-CM | POA: Diagnosis present

## 2020-04-01 LAB — LIPID PANEL
Cholesterol: 120 mg/dL (ref 0–200)
HDL: 37 mg/dL — ABNORMAL LOW (ref >40)
LDL Cholesterol: 68 mg/dL (ref 0–99)
Total CHOL/HDL Ratio: 3.2 RATIO
Triglycerides: 77 mg/dL (ref <150)
VLDL: 15 mg/dL (ref 0–40)

## 2020-04-01 LAB — SEDIMENTATION RATE: Sed Rate: 6 mm/hr (ref 0–16)

## 2020-04-01 LAB — GLUCOSE, CAPILLARY
Glucose-Capillary: 105 mg/dL — ABNORMAL HIGH (ref 70–99)
Glucose-Capillary: 95 mg/dL (ref 70–99)
Glucose-Capillary: 118 mg/dL — ABNORMAL HIGH (ref 70–99)
Glucose-Capillary: 126 mg/dL — ABNORMAL HIGH (ref 70–99)

## 2020-04-01 MED ORDER — CLOPIDOGREL BISULFATE 75 MG PO TABS
75.0000 mg | ORAL_TABLET | Freq: Every day | ORAL | Status: DC
  Administered 2020-04-02: 75 mg via ORAL
  Filled 2020-04-01: qty 1

## 2020-04-01 MED ORDER — CLOPIDOGREL BISULFATE 75 MG PO TABS
300.0000 mg | ORAL_TABLET | Freq: Once | ORAL | Status: AC
  Administered 2020-04-01: 300 mg via ORAL
  Filled 2020-04-01: qty 4

## 2020-04-01 NOTE — Progress Notes (Signed)
PROGRESS NOTE    Derek Ryan  KTG:[id number] DOB: 08/18/1956 DOA: 03/30/2020 PCP: Janora Norlander, DO    Brief Narrative:  Patient was admitted to the hospital with central retinal artery occlusion on the left side.   64 year old male with significant past medical history for hypertension, dyslipidemia and chronic diastolic heart failure.  Patient developed left visual loss, associated with headaches and left facial paresthesias.  August 12 he was evaluated in the emergency department, his head CT was negative and he was referred to ophthalmology.  On August 13 he was diagnosed with acute central retinal artery occlusion (ophthalmology) and was referred back to the ED for further stroke evaluation.  On his initial physical examination blood pressure 115/62, pulse rate 74, respiratory rate 14, oxygen saturation 95%.  His lungs were clear to auscultation bilaterally, heart S1-S2, present rhythmic, soft abdomen, no lower extremity edema.  Positive left visual loss, 10-12 o'clock, no other focal neurologic deficit. Sodium 138, potassium 3.7, chloride 100, glucose 119, BUN 19, creatinine 0.88, white count 7.9, hemoglobin 13.8, hematocrit 41.1, platelets 247.  SARS COVID-19 negative. Urinalysis negative for infection.  Brain MRI/MRA negative.  EKG 70 bpm, normal axis, right bundle branch block, sinus rhythm, no ST segment or T wave changes.  Assessment & Plan:   Principal Problem:   Central retinal artery occlusion of left eye Active Problems:   Anxiety, generalized   Dyslipidemia   Essential hypertension   Severe obesity (BMI 35.0-39.9) with comorbidity ([facility])   Chronic diastolic heart failure ([facility])   Severe obstructive sleep apnea   1.  Left central retinal artery occlusion.  Patient continued to have left sided blindness.  No other focal neurologic deficit. He underwent extensive work-up including brain MRI-MRA, carotid ultrasonography and echocardiography, all studies negative for  source of ischemic event. His lipid profile has a total cholesterol of 120, HDL of 37, LDL of 68, triglycerides 77.   Patient taking aspirin 81 mg daily and atorvastatin 10 mg daily.   Follow neurology recommendations to add clopidogrel and continue telemetry monitoring  2.  Hypertension.  Blood pressure been well controlled with valsartan/hydrochlorothiazide.  3.  Type 2 diabetes mellitus with dyslipidemia.  His hemoglobin A1c was 6.0 patient has been on diet control as an outpatient.  Continue atorvastatin.  4.  Chronic diastolic heart failure no signs of acute exacerbation.  5.  Obesity class II/obstructive sleep apnea.  Calculated BMI of 35.2, outpatient follow-up.  6.  Anxiety.  Continue BuSpar and Lexapro.   Patient continue to be at high risk for worsening CNS ischemia  Status is: Observation  The patient will require care spanning > 2 midnights and should be moved to inpatient because: Inpatient level of care appropriate due to severity of illness  Dispo: The patient is from: Home              Anticipated d/c is to: Home              Anticipated d/c date is: 2 days              Patient currently is not medically stable to d/c. Patient needs ongoing telemetry monitoring to rule out cardiac arrhythmia as source of embolism.     DVT prophylaxis: Enoxaparin   Code Status:   full  Family Communication:  I spoke with patient's fiance at the bedside, we talked in detail about patient's condition, plan of care and prognosis and all questions were addressed.      Consultants:  Neurology    Subjective: Patient continue to have left sided blindness, no other focal neurologic deficit, no nausea or vomiting, no chest pain or dyspnea   Objective: Vitals:   04/01/20 0017 04/01/20 0413 04/01/20 0816 04/01/20 1116  BP: (!) 141/66 116/61 110/62 121/60  Pulse: 66 66 69 60  Resp: 17 14 18 18   Temp: (!) 97.5 F (36.4 C) 98.4 F (36.9 C) 97.8 F (36.6 C) 98 F (36.7 C)    TempSrc: Oral Oral    SpO2: 98% 96% 95% 95%  Weight: 107 kg     Height: 5\' 8"  (1.727 m)       Intake/Output Summary (Last 24 hours) at 04/01/2020 1311 Last data filed at 03/31/2020 2230 Gross per 24 hour  Intake 120 ml  Output --  Net 120 ml   Filed Weights   03/31/20 2046 04/01/20 0017  Weight: 107.4 kg 107 kg    Examination:   General: Not in pain or dyspnea Neurology: Awake and alert, left sided blindness E ENT: no pallor, no icterus, oral mucosa moist Cardiovascular: No JVD. S1-S2 present, rhythmic, no gallops, rubs, or murmurs. No lower extremity edema. Pulmonary: positive breath sounds bilaterally, adequate air movement, no wheezing, rhonchi or rales. Gastrointestinal. Abdomen soft and non tender Skin. No rashes Musculoskeletal: no joint deformities     Data Reviewed: I have personally reviewed following labs and imaging studies  CBC: Recent Labs  Lab 03/30/20 1030 03/30/20 1324 03/31/20 1135  WBC 6.8  --  7.9  NEUTROABS 3.8  --  4.2  HGB 14.0 14.3 13.8  HCT 42.1 42.0 41.1  MCV 89.8  --  91.1  PLT 247  --  563   Basic Metabolic Panel: Recent Labs  Lab 03/30/20 1030 03/30/20 1324 03/31/20 1135  NA 139 140 138  K 3.4* 3.5 3.7  CL 102 100 100  CO2 26  --  29  GLUCOSE 174* 94 119*  BUN 16 17 19   CREATININE 0.81 0.80 0.88  CALCIUM 9.2  --  9.3   GFR: Estimated Creatinine Clearance: 99.2 mL/min (by C-G formula based on SCr of 0.88 mg/dL). Liver Function Tests: Recent Labs  Lab 03/30/20 1030 03/31/20 1135  AST 23 22  ALT 23 24  ALKPHOS 68 63  BILITOT 1.2 1.0  PROT 7.1 7.0  ALBUMIN 4.2 4.1   No results for input(s): LIPASE, AMYLASE in the last 168 hours. No results for input(s): AMMONIA in the last 168 hours. Coagulation Profile: Recent Labs  Lab 03/30/20 1030  INR 1.1   Cardiac Enzymes: No results for input(s): CKTOTAL, CKMB, CKMBINDEX, TROPONINI in the last 168 hours. BNP (last 3 results) No results for input(s): PROBNP in the  last 8760 hours. HbA1C: Recent Labs    03/31/20 1340  HGBA1C 6.5*   CBG: Recent Labs  Lab 03/30/20 1111 03/31/20 1742 04/01/20 0623 04/01/20 1115  GLUCAP 130* 209* 105* 95   Lipid Profile: Recent Labs    04/01/20 0256  CHOL 120  HDL 37*  LDLCALC 68  TRIG 77  CHOLHDL 3.2   Thyroid Function Tests: Recent Labs    03/31/20 1340  TSH 1.689   Anemia Panel: No results for input(s): VITAMINB12, FOLATE, FERRITIN, TIBC, IRON, RETICCTPCT in the last 72 hours.    Radiology Studies: I have reviewed all of the imaging during this hospital visit personally     Scheduled Meds: .  stroke: mapping our early stages of recovery book   Does not apply Once  .  aspirin EC  81 mg Oral Daily  . atorvastatin  10 mg Oral QHS  . busPIRone  15 mg Oral BID  . enoxaparin (LOVENOX) injection  40 mg Subcutaneous Q24H  . escitalopram  20 mg Oral Daily  . losartan  50 mg Oral Daily   And  . hydrochlorothiazide  12.5 mg Oral Daily  . insulin aspart  0-15 Units Subcutaneous TID WC   Continuous Infusions: . sodium chloride 50 mL/hr at 04/01/20 0858     LOS: 0 days         Gerome Apley, MD

## 2020-04-01 NOTE — Progress Notes (Addendum)
Occupational Therapy Evaluation Patient Details Name: Derek Ryan MRN: [id number] DOB: [birth year] Today's Date: 04/01/2020    History of Present Illness  64 y.o. male with medical history significant of HTN; HLD; and chronic diastolic CHF presenting with vision loss in L eye.  Wednesday afternoon he was at work (works outside at [employer], it was [employer]) and felt very hot.  He got ready to leave and noticed acute left frontal headache with numbness lateral to the left eye.  He developed acute left eye blurriness.  He went home but symptoms were still present and unchanged Thursday (still the same now).  He came to the ER yesterday and he had a CT which was negative.  He was sent to ophthalmology (Dr. Posey Pronto) and he did further evaluation and diagnosed central retinal artery occlusion.  He does not anticipate that the vision will return.   Clinical Impression   PTA, Pt lived alone and independent with all ADL and mobility and worked part time in a [employer]. Pt presents with vision loss L eye. Pt able to complete ADL and mobility at independent level. Adjusted font/brightness on phone to make it easier for pt to see. Discussed need to refrain from driving until cleared by eye doctor - pt verbalized understanding. Will return to complete education.     Follow Up Recommendations  No OT follow up;Other (comment) (follow up with eye doctor)    Equipment Recommendations  None recommended by OT    Recommendations for Other Services       Precautions / Restrictions Precautions Precautions: None      Mobility Bed Mobility Overal bed mobility: Independent                Transfers Overall transfer level: Independent                    Balance Overall balance assessment: Modified Independent                               Standardized Balance Assessment Standardized Balance Assessment : Dynamic Gait Index   Dynamic Gait Index Level Surface: Normal Change in  Gait Speed: Normal Gait with Horizontal Head Turns: Normal Gait with Vertical Head Turns: Normal Gait and Pivot Turn: Normal Step Over Obstacle: Normal Step Around Obstacles: Normal Steps: Normal Total Score: 24     ADL either performed or assessed with clinical judgement   ADL Overall ADL's : At baseline                                             Vision Baseline Vision/History: Wears glasses Wears Glasses: Reading only Vision Assessment?: Yes Eye Alignment: Within Functional Limits Ocular Range of Motion: Within Functional Limits Alignment/Gaze Preference: Within Defined Limits Tracking/Visual Pursuits: Decreased smoothness of horizontal tracking;Decreased smoothness of vertical tracking Saccades: Additional eye shifts occurred during testing Convergence: Within functional limits Visual Fields: Left visual field deficit Additional Comments: More difficulty with superiror vision in L eye; affecting ability to text   Appears L eye dominant Increased lighting improves functional vision.   Perception     Praxis Praxis Praxis tested?: Within functional limits    Pertinent Vitals/Pain Pain Assessment: No/denies pain     Hand Dominance Left   Extremity/Trunk Assessment Upper Extremity Assessment Upper Extremity Assessment: Overall WFL for tasks  assessed   Lower Extremity Assessment Lower Extremity Assessment: Overall WFL for tasks assessed   Cervical / Trunk Assessment Cervical / Trunk Assessment: Normal;Other exceptions ("days where my back bothers me")   Communication Communication Communication: No difficulties   Cognition Arousal/Alertness: Awake/alert Behavior During Therapy: WFL for tasks assessed/performed Overall Cognitive Status: Within Functional Limits for tasks assessed                                     General Comments  Discussing wife's death and guilt feeling over making the decision to withdraw care; supportive  listening provided    Exercises     Shoulder Instructions      Home Living Family/patient expects to be discharged to:: Private residence Living Arrangements: Alone;Spouse/significant other Available Help at Discharge: Family;Available PRN/intermittently Type of Home: House Home Access: Stairs to enter [employer] of Steps: 3 Entrance Stairs-Rails: Right;Left Home Layout: One level     Bathroom Shower/Tub: Occupational psychologist: Handicapped height Bathroom Accessibility: Yes How Accessible: Accessible via walker Home Equipment: Cane - single point;Grab bars - tub/shower;Grab bars - toilet          Prior Functioning/Environment Level of Independence: Independent        Comments: 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; Retail buyer; drives; Biochemist, clinical        OT Problem List: Impaired vision/perception      OT Treatment/Interventions:      OT Goals(Current goals can be found in the care plan section) Acute Rehab OT Goals Patient Stated Goal: to get back home to his dog OT Goal Formulation: With patient Time For Goal Achievement: 04/08/20 Potential to Achieve Goals: Good  OT Frequency:     Barriers to D/C:            Co-evaluation              AM-PAC OT "6 Clicks" Daily Activity     Outcome Measure Help from another person eating meals?: None Help from another person taking care of personal grooming?: None Help from another person toileting, which includes using toliet, bedpan, or urinal?: None Help from another person bathing (including washing, rinsing, drying)?: None Help from another person to put on and taking off regular upper body clothing?: None Help from another person to put on and taking off regular lower body clothing?: None 6 Click Score: 24   End of Session Nurse Communication: Other (comment);Mobility status (DC needs)  Activity Tolerance: Patient tolerated treatment well Patient left: in  chair;with call bell/phone within reach  OT Visit Diagnosis: Low vision, both eyes (H54.2) (L eye)                Time: 4081-4481 OT Time Calculation (min): 25 min Charges:  OT General Charges $OT Visit: 1 Visit OT Evaluation $OT Eval Low Complexity: 1 Low OT Treatments $Self Care/Home Management : 8-22 mins  Maurie Boettcher, OT/L   Acute OT Clinical Specialist Acute Rehabilitation Services Pager [phone] Office [phone]   [facility] 04/01/2020, 10:25 AM

## 2020-04-01 NOTE — Progress Notes (Signed)
Occupational Therapy Treatment Note Completed education regarding low vision and compensatory strategies - written information provided. Pt states his girlfriend will be able to assist with driving until he sees his eye doctor. Pt very appreciative. OT signing off.     04/01/20 1000  OT Visit Information  Last OT Received On 04/01/20  History of Present Illness  64 y.o. male with medical history significant of HTN; HLD; and chronic diastolic CHF presenting with vision loss in L eye.  Wednesday afternoon he was at work (works outside at [employer], it was [employer]) and felt very hot.  He got ready to leave and noticed acute left frontal headache with numbness lateral to the left eye.  He developed acute left eye blurriness.  He went home but symptoms were still present and unchanged Thursday (still the same now).  He came to the ER yesterday and he had a CT which was negative.  He was sent to ophthalmology (Dr. Posey Pronto) and he did further evaluation and diagnosed central retinal artery occlusion.  He does not anticipate that the vision will return.  Precautions  Precautions None  Pain Assessment  Pain Assessment No/denies pain  Cognition  Arousal/Alertness Awake/alert  Behavior During Therapy WFL for tasks assessed/performed  Overall Cognitive Status Within Functional Limits for tasks assessed  General Comments  General comments (skin integrity, edema, etc.) educated pt on compensatory strategies for low vision. Educated on use of lighting, contrast adn reducing clutter to compensate for vision loss. Written information provided. REviewed warning signs/symptoms of CVA kusing BeFast. Pt verbalized understanding.   OT - End of Session  Activity Tolerance Patient tolerated treatment well  Patient left in chair;with call bell/phone within reach  Nurse Communication Other (comment) (DC needs)  OT Assessment/Plan  OT Plan Discharge plan remains appropriate;All goals met and education completed, patient  discharged from OT services  OT Visit Diagnosis Low vision, both eyes (H54.2)  Follow Up Recommendations No OT follow up  OT Equipment None recommended by OT  AM-PAC OT "6 Clicks" Daily Activity Outcome Measure (Version 2)  Help from another person eating meals? 4  Help from another person taking care of personal grooming? 4  Help from another person toileting, which includes using toliet, bedpan, or urinal? 4  Help from another person bathing (including washing, rinsing, drying)? 4  Help from another person to put on and taking off regular upper body clothing? 4  Help from another person to put on and taking off regular lower body clothing? 4  6 Click Score 24  OT Goal Progression  Progress towards OT goals Goals met/education completed, patient discharged from OT  Acute Rehab OT Goals  Patient Stated Goal to get back home to his dog  OT Goal Formulation With patient  Time For Goal Achievement 04/08/20  Potential to Achieve Goals Good  OT Time Calculation  OT Start Time (ACUTE ONLY) 1002  OT Stop Time (ACUTE ONLY) 1017  OT Time Calculation (min) 15 min  OT General Charges  $OT Visit 1 Visit  OT Treatments  $Self Care/Home Management  8-22 mins

## 2020-04-01 NOTE — Progress Notes (Signed)
Nutrition Brief Note RD working remotely.  RD consulted to assessment of nutritional requirements/ status.  Wt Readings from Last 15 Encounters:  04/01/20 107 kg  03/30/20 105.2 kg  02/23/20 107.3 kg  12/13/19 106.8 kg  09/01/19 107.5 kg  08/25/19 108.2 kg  07/26/19 107 kg  07/19/19 106.6 kg  06/24/19 105 kg  04/12/19 110.3 kg   Derek Ryan is a 64 y.o. male with medical history significant of HTN; HLD; and chronic diastolic CHF presenting with vision loss in L eye.  Wednesday afternoon he was at work (works outside at [employer], it was [employer]) and felt very hot.  He got ready to leave and noticed acute left frontal headache with numbness lateral to the left eye.  He developed acute left eye blurriness.  He went home but symptoms were still present and unchanged Thursday (still the same now).  He came to the ER yesterday and he had a CT which was negative.  He was sent to ophthalmology (Dr. Posey Pronto) and he did further evaluation and diagnosed central retinal artery occlusion.  He does not anticipate that the vision will return  Pt admitted with central retinal artery occlusion of lt eye.   Reviewed I/O's: +120 ml x 24 hours  Attempted to speak with pt via phone call to hospital room, however, no answer.  Reviewed wt hx; wt has been stable over the past year.  Lab Results  Component Value Date   HGBA1C 6.5 (H) 03/31/2020  PTA DM medications are none.   Labs reviewed: CBGS: 209 (inpatient orders for glycemic control are 0-15 units insulin aspart TID with meals).   Body mass index is 35.88 kg/m. Patient meets criteria for obesity, class II based on current BMI. Obesity is a complex, chronic medical condition that is optimally managed by a multidisciplinary care team. Weight loss is not an ideal goal for an acute inpatient hospitalization. However, if further work-up for obesity is warranted, consider outpatient referral to outpatient bariatric service and/or [facility].   Current diet order is heart healthy/ carb modified, patient is consuming approximately n/a% of meals at this time. Labs and medications reviewed.   No nutrition interventions warranted at this time. If nutrition issues arise, please consult RD.   Loistine Chance, RD, LDN, [place] Registered Dietitian II Certified Diabetes Care and Education Specialist Please refer to [facility] for RD and/or RD on-call/weekend/after hours pager

## 2020-04-01 NOTE — Progress Notes (Signed)
PT Cancellation Note  Patient Details Name: Derek Ryan MRN: [id number] DOB: [birth year]   Cancelled Treatment:    Reason Eval/Treat Not Completed: PT screened, no needs identified, will sign off. Following a chart review and discussion of the pt's status with OT, the pt is independent and does not have any acute PT needs at this time. Thank you for the consult, please feel free to re-consult if there is a change in status.   Hardie Pulley, DPT   Acute Rehabilitation Department Pager #: [phone]   Otho Bellows 04/01/2020, 11:42 AM

## 2020-04-01 NOTE — Consult Note (Addendum)
Referring Physician: Dr Alwyn Pea    Reason for Consult: CRAO  HPI: Derek Ryan is an 64 y.o. male with HTN, HLD, LVH, OSA, CHF, GERD and depression who noted a sudden vision change in left eye while working in 95 degree heat in a brick yard on 8/11. She though he was just over heated. He went home, cooled down and went to bed. The following day he sought medical attention when this did not go away. After being examined by an ophthalmologist, he was diagnosed with CRAO of the left eye and he was sent to the ER for stroke wk up on 8/13. MRI neg for stroke. CUS neg for any stenosis.  He specifically denies any symptoms concerning for GCA including jaw claudication, proximal muscle weakness (difficulty climbing stairs, difficult goal to getting up out of a chair, difficulty reaching for objects above his head), scalp sensitivity, headaches  tPA Given: no, outside of time window  Past Medical History Past Medical History:  Diagnosis Date  . Central retinal artery occlusion of left eye   . Chronic diastolic heart failure ([place]) 08/25/2019  . Depression   . GERD (gastroesophageal reflux disease)   . Hyperlipidemia   . Hypertension   . 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 OSA (obstructive sleep apnea)    unable to tolerate CPAP    Surgical History History reviewed. No pertinent surgical history.  Family History  Family History  Problem Relation Age of Onset  . Heart attack Mother   . Alcohol abuse Father   . Stomach cancer Sister   . Hypertension Brother   . Brain cancer Brother     Social History:   reports that he quit smoking about 29 years ago. His smoking use included cigarettes. He has a 23.00 pack-year smoking history. He has never used smokeless tobacco. He reports previous alcohol use. He reports that he does not use drugs.  Allergies:  Allergies  Allergen Reactions  . Doxycycline Itching    As of 03/31/20, pt does not recall this  reaction    Home Medications:  Medications Prior to Admission  Medication Sig Dispense Refill  . albuterol (VENTOLIN HFA) 108 (90 Base) MCG/ACT inhaler TAKE 2 PUFFS BY MOUTH EVERY 6 HOURS AS NEEDED FOR WHEEZE OR SHORTNESS OF BREATH (Patient taking differently: Inhale 2 puffs into the lungs every 6 (six) hours as needed for wheezing or shortness of breath. ) 18 g 1  . aspirin EC 81 MG tablet Take 81 mg by mouth daily.    [name] atorvastatin (LIPITOR) 10 MG tablet TAKE 1 TABLET BY MOUTH EVERYDAY AT BEDTIME (Patient taking differently: Take 10 mg by mouth at bedtime. ) 90 tablet 3  . busPIRone (BUSPAR) 10 MG tablet Take 1.5 tablets (15 mg total) by mouth 2 (two) times daily. For anxiety 270 tablet 3  . cholecalciferol (VITAMIN D3) 25 MCG (1000 UT) tablet Take 2,000 Units by mouth daily.    [name] escitalopram (LEXAPRO) 20 MG tablet Take 1 tablet (20 mg total) by mouth daily. 90 tablet 3  . losartan-hydrochlorothiazide (HYZAAR) 50-12.5 MG tablet Take 1 tablet by mouth daily. 90 tablet 2  . Omega-3 Fatty Acids (FISH OIL) 1000 MG CAPS Take by mouth.    . vitamin C (ASCORBIC ACID) 500 MG tablet Take 500 mg by mouth daily.    [name] zinc sulfate 220 (50 Zn) MG capsule Take 220 mg by mouth daily.      Hospital Medications .  stroke:  mapping our early stages of recovery book   Does not apply Once  . aspirin EC  81 mg Oral Daily  . atorvastatin  10 mg Oral QHS  . busPIRone  15 mg Oral BID  . enoxaparin (LOVENOX) injection  40 mg Subcutaneous Q24H  . escitalopram  20 mg Oral Daily  . losartan  50 mg Oral Daily   And  . hydrochlorothiazide  12.5 mg Oral Daily  . insulin aspart  0-15 Units Subcutaneous TID WC    ROS:  History obtained from   General ROS: negative for - chills, fatigue, fever, night sweats, weight gain or weight loss Psychological ROS: negative for - behavioral disorder, hallucinations, memory difficulties, mood swings or suicidal ideation Ophthalmic ROS: negative for - blurry vision, double  vision, eye pain or loss of vision ENT ROS: negative for - epistaxis, nasal discharge, oral lesions, sore throat, tinnitus or vertigo Allergy and Immunology ROS: negative for - hives or itchy/watery eyes Hematological and Lymphatic ROS: negative for - bleeding problems, bruising or swollen lymph nodes Endocrine ROS: negative for - galactorrhea, hair pattern changes, polydipsia/polyuria or temperature intolerance Respiratory ROS: negative for - cough, hemoptysis, shortness of breath or wheezing Cardiovascular ROS: negative for - chest pain, dyspnea on exertion, edema or irregular heartbeat Gastrointestinal ROS: negative for - abdominal pain, diarrhea, hematemesis, nausea/vomiting or stool incontinence Genito-Urinary ROS: negative for - dysuria, hematuria, incontinence or urinary frequency/urgency Musculoskeletal ROS: negative for - joint swelling or muscular weakness Neurological ROS: as noted in HPI Dermatological ROS: negative for rash and skin lesion changes   Physical Examination:  Vitals:   04/01/20 0017 04/01/20 0413 04/01/20 0816 04/01/20 1116  BP: (!) 141/66 116/61 110/62 121/60  Pulse: 66 66 69 60  Resp: _0 Temp: (!) 97.5 F (36.4 C) 98.4 F (36.9 C) 97.8 F (36.6 C) 98 F (36.7 C)  TempSrc: Oral Oral    SpO2: 98% 96% 95% 95%  Weight: 107 kg     Height: _1  (1.727 m)      General: Appears well-developed, no distress Psych: Affect appropriate to situation Eyes: No scleral injection HENT: No OP obstrucion Head: Normocephalic.  Cardiovascular: Normal rate and regular rhythm.  Respiratory: Effort normal and breath sounds normal to anterior ascultation GI: Soft.  No distension. There is no tenderness.  Skin: WDI    Neurological Examination Mental Status: Alert, oriented, thought content appropriate.  Speech fluent without evidence of aphasia. Able to follow 3 step commands without difficulty. Cranial Nerves: II: Visual fields grossly normal on right. Left  VF has an upper left quadrant deficit.  III,IV, VI: ptosis not present, extra-ocular motions intact bilaterally, pupils equal, round, reactive to light and accommodation V,VII: smile symmetric, facial light touch sensation normal bilaterally VIII: hearing normal bilaterally IX,X: uvula rises symmetrically XI: bilateral shoulder shrug XII: midline tongue extension Motor: Right : Upper extremity   5/5    Left:     Upper extremity   5/5  Lower extremity   5/5     Lower extremity   5/5 Tone and bulk:normal tone throughout; no atrophy noted Sensory: Pinprick and light touch intact throughout, bilaterally Deep Tendon Reflexes: 2+ and symmetric throughout Plantars: Right: downgoing   Left: downgoing Cerebellar: normal finger-to-nose, normal rapid alternating movements and normal heel-to-shin test Gait: normal gait and station, able to raise on heels and on toes able to tandem walk NIHSS TOTAL: 0    LABORATORY STUDIES:  Basic Metabolic Panel: Recent Labs  Lab 03/30/20 1030 03/30/20 1324 03/31/20 1135  NA 139 140 138  K 3.4* 3.5 3.7  CL 102 100 100  CO2 26  --  29  GLUCOSE 174* 94 119*  BUN _0 CREATININE 0.81 0.80 0.88  CALCIUM 9.2  --  9.3    Liver Function Tests: Recent Labs  Lab 03/30/20 1030 03/31/20 1135  AST 23 22  ALT 23 24  ALKPHOS 68 63  BILITOT 1.2 1.0  PROT 7.1 7.0  ALBUMIN 4.2 4.1   No results for input(s): LIPASE, AMYLASE in the last 168 hours. No results for input(s): AMMONIA in the last 168 hours.  CBC: Recent Labs  Lab 03/30/20 1030 03/30/20 1324 03/31/20 1135  WBC 6.8  --  7.9  NEUTROABS 3.8  --  4.2  HGB 14.0 14.3 13.8  HCT 42.1 42.0 41.1  MCV 89.8  --  91.1  PLT 247  --  247    Cardiac Enzymes: No results for input(s): CKTOTAL, CKMB, CKMBINDEX, TROPONINI in the last 168 hours.  BNP: Invalid input(s): POCBNP  CBG: Recent Labs  Lab 03/30/20 1111 03/31/20 1742 04/01/20 0623 04/01/20 1115  GLUCAP 130* 209* 105* 95     Microbiology:   Coagulation Studies: Recent Labs    03/30/20 1030  LABPROT 13.3  INR 1.1    Urinalysis:  Recent Labs  Lab 03/30/20 1345  COLORURINE YELLOW  LABSPEC 1.010  PHURINE 7.0  GLUCOSEU NEGATIVE  HGBUR NEGATIVE  BILIRUBINUR NEGATIVE  KETONESUR NEGATIVE  PROTEINUR NEGATIVE  NITRITE NEGATIVE  LEUKOCYTESUR NEGATIVE    Lipid Panel:     Component Value Date/Time   CHOL 120 04/01/2020 0256   TRIG 77 04/01/2020 0256   HDL 37 (L) 04/01/2020 0256   CHOLHDL 3.2 04/01/2020 0256   VLDL 15 04/01/2020 0256   LDLCALC 68 04/01/2020 0256    HgbA1C:  Lab Results  Component Value Date   HGBA1C 6.5 (H) 03/31/2020    Urine Drug Screen:      Component Value Date/Time   LABOPIA NONE DETECTED 03/31/2020 1404   COCAINSCRNUR NONE DETECTED 03/31/2020 1404   LABBENZ NONE DETECTED 03/31/2020 1404   AMPHETMU NONE DETECTED 03/31/2020 1404   THCU NONE DETECTED 03/31/2020 1404   LABBARB NONE DETECTED 03/31/2020 1404     Alcohol Level:  Recent Labs  Lab 03/30/20 1309  ETH <10   ESR of 6  Miscellaneous labs:  ECG - SR rate   BPM. (See cardiology reading for complete details)   IMAGING: MR ANGIO HEAD WO CONTRAST  Result Date: 03/30/2020 CLINICAL DATA:  Initial evaluation for neuro deficit, stroke suspected. EXAM: MRI HEAD WITHOUT CONTRAST MRA HEAD WITHOUT CONTRAST TECHNIQUE: Multiplanar, multiecho pulse sequences of the brain and surrounding structures were obtained without intravenous contrast. Angiographic images of the head were obtained using MRA technique without contrast. COMPARISON:  Comparison made with prior CT from earlier the same day. FINDINGS: MRI HEAD FINDINGS Brain: Mild diffuse prominence of the CSF containing spaces compatible generalized age-related cerebral atrophy. Patchy T2/FLAIR hyperintensity within the periventricular and deep white matter both cerebral hemispheres most consistent with chronic small vessel ischemic disease, mild in nature. No  abnormal foci of restricted diffusion to suggest acute or subacute ischemia. Gray-white matter differentiation well maintained. No encephalomalacia to suggest chronic cortical infarction. No foci of susceptibility artifact to suggest acute or chronic intracranial hemorrhage. No mass lesion, midline shift or mass effect. Ventricles normal size without hydrocephalus. No extra-axial fluid collection. Pituitary gland suprasellar region normal.  Midline structures intact. Vascular: Major intracranial vascular flow voids are well maintained. Skull and upper cervical spine: Craniocervical junction within normal limits. Bone marrow signal intensity normal. No scalp soft tissue abnormality. Sinuses/Orbits: Globes and orbital soft tissues within normal limits. Paranasal sinuses are largely clear. Small bilateral mastoid effusions noted, of doubtful significance. Inner ear structures grossly normal. Other: None. MRA HEAD FINDINGS ANTERIOR CIRCULATION: Visualized distal cervical segments of the internal carotid arteries are widely patent with symmetric antegrade flow. Petrous, cavernous, and supraclinoid ICAs widely patent without stenosis or other abnormality. A1 segments widely patent. Normal anterior communicating artery complex. Anterior cerebral arteries patent to their distal aspects without stenosis. No M1 stenosis or occlusion. Normal right MCA bifurcation. Left MCA trifurcates. Distal MCA branches well perfused and symmetric. POSTERIOR CIRCULATION: Both vertebral arteries patent to the vertebrobasilar junction without stenosis. Right vertebral artery slightly dominant and mildly tortuous crossing the midline prior to the vertebrobasilar junction. Both picas patent. Basilar widely patent to its distal aspect without stenosis. Superior cerebral arteries patent bilaterally. Both PCAs primarily supplied via the basilar and are well perfused to their distal aspects. Prominent right posterior communicating artery noted. No  intracranial aneurysm or other vascular abnormality. IMPRESSION: MRI HEAD IMPRESSION: 1. No acute intracranial abnormality. 2. Mild age-related cerebral atrophy with chronic small vessel ischemic disease. MRA HEAD IMPRESSION: Normal intracranial MRA. No large vessel occlusion, hemodynamically significant stenosis, or other acute vascular abnormality. No aneurysm. Electronically Signed   By: Jeannine Boga M.D.   On: 03/30/2020 21:47   MR BRAIN WO CONTRAST  Result Date: 03/30/2020 CLINICAL DATA:  Initial evaluation for neuro deficit, stroke suspected. EXAM: MRI HEAD WITHOUT CONTRAST MRA HEAD WITHOUT CONTRAST TECHNIQUE: Multiplanar, multiecho pulse sequences of the brain and surrounding structures were obtained without intravenous contrast. Angiographic images of the head were obtained using MRA technique without contrast. COMPARISON:  Comparison made with prior CT from earlier the same day. FINDINGS: MRI HEAD FINDINGS Brain: Mild diffuse prominence of the CSF containing spaces compatible generalized age-related cerebral atrophy. Patchy T2/FLAIR hyperintensity within the periventricular and deep white matter both cerebral hemispheres most consistent with chronic small vessel ischemic disease, mild in nature. No abnormal foci of restricted diffusion to suggest acute or subacute ischemia. Gray-white matter differentiation well maintained. No encephalomalacia to suggest chronic cortical infarction. No foci of susceptibility artifact to suggest acute or chronic intracranial hemorrhage. No mass lesion, midline shift or mass effect. Ventricles normal size without hydrocephalus. No extra-axial fluid collection. Pituitary gland suprasellar region normal. Midline structures intact. Vascular: Major intracranial vascular flow voids are well maintained. Skull and upper cervical spine: Craniocervical junction within normal limits. Bone marrow signal intensity normal. No scalp soft tissue abnormality. Sinuses/Orbits:  Globes and orbital soft tissues within normal limits. Paranasal sinuses are largely clear. Small bilateral mastoid effusions noted, of doubtful significance. Inner ear structures grossly normal. Other: None. MRA HEAD FINDINGS ANTERIOR CIRCULATION: Visualized distal cervical segments of the internal carotid arteries are widely patent with symmetric antegrade flow. Petrous, cavernous, and supraclinoid ICAs widely patent without stenosis or other abnormality. A1 segments widely patent. Normal anterior communicating artery complex. Anterior cerebral arteries patent to their distal aspects without stenosis. No M1 stenosis or occlusion. Normal right MCA bifurcation. Left MCA trifurcates. Distal MCA branches well perfused and symmetric. POSTERIOR CIRCULATION: Both vertebral arteries patent to the vertebrobasilar junction without stenosis. Right vertebral artery slightly dominant and mildly tortuous crossing the midline prior to the vertebrobasilar junction. Both picas patent. Basilar widely patent to its distal aspect  without stenosis. Superior cerebral arteries patent bilaterally. Both PCAs primarily supplied via the basilar and are well perfused to their distal aspects. Prominent right posterior communicating artery noted. No intracranial aneurysm or other vascular abnormality. IMPRESSION: MRI HEAD IMPRESSION: 1. No acute intracranial abnormality. 2. Mild age-related cerebral atrophy with chronic small vessel ischemic disease. MRA HEAD IMPRESSION: Normal intracranial MRA. No large vessel occlusion, hemodynamically significant stenosis, or other acute vascular abnormality. No aneurysm. Electronically Signed   By: Jeannine Boga M.D.   On: 03/30/2020 21:47   ECHOCARDIOGRAM COMPLETE  Result Date: 03/31/2020    ECHOCARDIOGRAM REPORT   Patient Name:   Derek Ryan Date of Exam: 03/31/2020 Medical Rec #:  [id number]      Height:       68.0 in Accession #:    [id number]     Weight:       232.0 lb Date of Birth:   1956/08/07     BSA:          2.177 m Patient Age:    79 years       BP:           131/54 mmHg Patient Gender: M              HR:           70 bpm. Exam Location:  Inpatient Procedure: 2D Echo Indications:    stroke 434.91  History:        Patient has no prior history of Echocardiogram examinations.                 Risk Factors:Sleep Apnea, Hypertension and Dyslipidemia.  Sonographer:    Johny Chess Referring Phys: [place]  1. Left ventricular ejection fraction, by estimation, is 60 to 65%. The left ventricle has normal function. The left ventricle has no regional wall motion abnormalities. Left ventricular diastolic parameters are consistent with Grade I diastolic dysfunction (impaired relaxation).  2. Right ventricular systolic function is normal. The right ventricular size is normal.  3. The mitral valve is normal in structure. No evidence of mitral valve regurgitation. No evidence of mitral stenosis.  4. The aortic valve is normal in structure. Aortic valve regurgitation is not visualized. No aortic stenosis is present.  5. The inferior vena cava is normal in size with greater than 50% respiratory variability, suggesting right atrial pressure of 3 mmHg. FINDINGS  Left Ventricle: Left ventricular ejection fraction, by estimation, is 60 to 65%. The left ventricle has normal function. The left ventricle has no regional wall motion abnormalities. The left ventricular internal cavity size was normal in size. There is  no left ventricular hypertrophy. Left ventricular diastolic parameters are consistent with Grade I diastolic dysfunction (impaired relaxation). Indeterminate filling pressures. Right Ventricle: The right ventricular size is normal. No increase in right ventricular wall thickness. Right ventricular systolic function is normal. Left Atrium: Left atrial size was normal in size. Right Atrium: Right atrial size was normal in size. Pericardium: There is no evidence of pericardial  effusion. Mitral Valve: The mitral valve is normal in structure. Normal mobility of the mitral valve leaflets. No evidence of mitral valve regurgitation. No evidence of mitral valve stenosis. Tricuspid Valve: The tricuspid valve is normal in structure. Tricuspid valve regurgitation is not demonstrated. No evidence of tricuspid stenosis. Aortic Valve: The aortic valve is normal in structure. Aortic valve regurgitation is not visualized. No aortic stenosis is present. Pulmonic Valve: The pulmonic valve was normal in structure. Pulmonic  valve regurgitation is not visualized. No evidence of pulmonic stenosis. Aorta: The aortic root is normal in size and structure. Venous: The inferior vena cava is normal in size with greater than 50% respiratory variability, suggesting right atrial pressure of 3 mmHg. IAS/Shunts: No atrial level shunt detected by color flow Doppler.  LEFT VENTRICLE PLAX 2D LVIDd:         4.30 cm Diastology LVIDs:         2.70 cm LV e' lateral:   10.10 cm/s LV PW:         1.10 cm LV E/e' lateral: 7.7 LV IVS:        1.00 cm LV e' medial:    7.72 cm/s                        LV E/e' medial:  10.1  RIGHT VENTRICLE             IVC RV S prime:     16.00 cm/s  IVC diam: 1.30 cm TAPSE (M-mode): 2.5 cm LEFT ATRIUM             Index       RIGHT ATRIUM           Index LA diam:        3.70 cm 1.70 cm/m  RA Area:     16.40 cm LA Vol (A2C):   47.1 ml 21.64 ml/m RA Volume:   43.00 ml  19.75 ml/m LA Vol (A4C):   38.0 ml 17.46 ml/m LA Biplane Vol: 43.3 ml 19.89 ml/m  AORTIC VALVE LVOT Vmax:   150.00 cm/s LVOT Vmean:  82.800 cm/s LVOT VTI:    0.280 m  AORTA Ao Root diam: 3.30 cm Ao Asc diam:  2.90 cm MITRAL VALVE MV Area (PHT): 2.69 cm     SHUNTS MV Decel Time: 282 msec     Systemic VTI: 0.28 m MV E velocity: 77.80 cm/s MV A velocity: 103.00 cm/s MV E/A ratio:  0.76 Mihai Croitoru MD Electronically signed by Sanda Klein MD Signature Date/Time: 03/31/2020/4:23:54 PM    Final    VAS US CAROTID (at [facility] and [facility]  only)  Result Date: 04/01/2020 Carotid Arterial Duplex Study Indications:       Central retinal artery occlusion. Risk Factors:      Hypertension, hyperlipidemia. Comparison Study:  no prior Performing Technologist: Abram Sander RVS  Examination Guidelines: A complete evaluation includes B-mode imaging, spectral Doppler, color Doppler, and power Doppler as needed of all accessible portions of each vessel. Bilateral testing is considered an integral part of a complete examination. Limited examinations for reoccurring indications may be performed as noted.  Right Carotid Findings: +----------+--------+--------+--------+------------------+--------+           PSV cm/sEDV cm/sStenosisPlaque DescriptionComments +----------+--------+--------+--------+------------------+--------+ CCA Prox  130     14              heterogenous               +----------+--------+--------+--------+------------------+--------+ CCA Distal101     19              heterogenous               +----------+--------+--------+--------+------------------+--------+ ICA Prox  86      11      1-39%   heterogenous               +----------+--------+--------+--------+------------------+--------+ ICA Distal98      21                                         +----------+--------+--------+--------+------------------+--------+  ECA       171     10                                         +----------+--------+--------+--------+------------------+--------+ +----------+--------+-------+--------+-------------------+           PSV cm/sEDV cmsDescribeArm Pressure (mmHG) +----------+--------+-------+--------+-------------------+ [id number]                                        +----------+--------+-------+--------+-------------------+ +---------+--------+--+--------+-+---------+ VertebralPSV cm/s39EDV cm/s7Antegrade +---------+--------+--+--------+-+---------+  Left Carotid Findings:  +----------+--------+--------+--------+------------------+--------+           PSV cm/sEDV cm/sStenosisPlaque DescriptionComments +----------+--------+--------+--------+------------------+--------+ CCA Prox  118     26              heterogenous               +----------+--------+--------+--------+------------------+--------+ CCA Distal99      17              heterogenous               +----------+--------+--------+--------+------------------+--------+ ICA Prox  106     25      1-39%   heterogenous               +----------+--------+--------+--------+------------------+--------+ ICA Distal85      17                                         +----------+--------+--------+--------+------------------+--------+ ECA       224                                                +----------+--------+--------+--------+------------------+--------+ +----------+--------+--------+--------+-------------------+           PSV cm/sEDV cm/sDescribeArm Pressure (mmHG) +----------+--------+--------+--------+-------------------+ [id number]                                         +----------+--------+--------+--------+-------------------+ +---------+--------+--+--------+-+---------+ VertebralPSV cm/s40EDV cm/s9Antegrade +---------+--------+--+--------+-+---------+   Summary: Right Carotid: Velocities in the right ICA are consistent with a 1-39% stenosis. Left Carotid: Velocities in the left ICA are consistent with a 1-39% stenosis. Vertebrals: Bilateral vertebral arteries demonstrate antegrade flow. *See table(s) above for measurements and observations.  Electronically signed by Ruta Hinds MD on 04/01/2020 at 12:11:50 PM.    Final     Assessment:  Mr. Derek Ryan is a 65 y.o. male with history of HTN, HLD, LVH, OSA, CHF, GERD and depression who noted a sudden vision change in left eye while working in 95 degree heat in a brick yard on 8/11. He was diagnosed with CRAO of  left eye at his ophthalmologist and sent to [facility] for stroke work up. He did not receive IV t-PA due to many days outside of time window.  ESR is reassuringly low and patient denies any symptoms of giant cell arteritis (GCA).   1. CARO, left- Cryptogenic etiology. MRI neg for ischemic stroke. CUS and MRA neg for stenosis. Echo 55%EF, no thrombus. On ASA daily at home.  2. HTN-  no role for permissive HTN at this time. Normotensive long term goals 3.HLD- LDL is 68 on home Lipitor 74m, goal is <70. No change in statin for d/c given he is under goal.  4. DM2- A1c is well controlled 6.5  Other Stroke Risk Factors  Age  Previous Cigarette smoker  Congestive Heart Failure  RECS:  Add SED to labs  DAPT for 3wk then monotherapy (Aspirin indefinitely)  Statin Therapy should stay same as home - LDL goal < 70  Risk factor modification  30-day event monitor on discharge   No driving till cleared by ophthalmology  1-2 wk f/u with [facility] as out pt  Desiree Metzger-Cihelka, ARNP-C, ANVP-BC Pager: [phone] Attending Neurologist's note:  I personally saw this patient, gathering history, performing a full neurologic examination, reviewing relevant labs, personally reviewing relevant imaging including MRI brain and MRA head, and formulated the assessment and plan, adding the note above for completeness and clarity to accurately reflect my thoughts.  Neurologically, the patient is ready for discharge with recommendations as outlined above.  Neurology will sign off at this time.  Please reconsult [place] any new neurological questions.  SLesleigh NoeMD-PhD [facility] [phone]

## 2020-04-02 DIAGNOSIS — G4733 Obstructive sleep apnea (adult) (pediatric): Secondary | ICD-10-CM

## 2020-04-02 LAB — GLUCOSE, CAPILLARY: Glucose-Capillary: 107 mg/dL — ABNORMAL HIGH (ref 70–99)

## 2020-04-02 MED ORDER — CLOPIDOGREL BISULFATE 75 MG PO TABS: 75.0000 mg | ORAL_TABLET | Freq: Every day | ORAL | 0 refills | Status: AC

## 2020-04-02 NOTE — Discharge Summary (Addendum)
Physician Discharge Summary  Derek Ryan:[id number] DOB: 03/19/1956 DOA: 03/30/2020  PCP: Janora Norlander, DO  Admit date: 03/30/2020 Discharge date: 04/02/2020  Admitted From: Home  Disposition:  Home   Recommendations for Outpatient Follow-up and new medication changes:  1. Follow up with Dr. Lajuana Ripple in 7 days.  2. Patient has been placed on dual antiplatelet therapy with aspirin and clopidogrel for 3 weeks, then continue aspirin alone.  3. Not driving until cleared by ophthalmology 4. Follow up with Neurology in one to two weeks. 5. Follow up with EP for 30 day event cardiac monitoring.    Late entry 04/03/20. I contacted cardiology for outpatient heart monitor 30 day event monitor follow up. Patient will be called with further instructions. I informed patient over the phone.   Home Health: no   Equipment/Devices: no    Discharge Condition: stable  CODE STATUS: partial  Diet recommendation: hearth healthy   Brief/Interim Summary: Patient was admitted to the hospital with the working diagnosis of central retinal artery occlusion on the left side.  64 year old male with significant past medical history for hypertension, dyslipidemia and chronic diastolic heart failure.  Patient developed left visual loss, associated with headaches and left facial paresthesias.  August 11 was evaluated in the emergency department, his head CT was negative and he was referred to ophthalmology.  On August 12 he was diagnosed with acute central retinal artery occlusion (ophthalmology) and was referred back to the ED for further stroke evaluation.  On his physical examination on admission his blood pressure was 115/62, pulse rate 74, respiratory rate 14, oxygen saturation 95%.  His lungs were clear to auscultation bilaterally, heart S1-S2, present rhythmic, soft abdomen, no lower extremity edema.  Positive left visual loss, 10-12 o'clock, no other focal neurologic deficit. Sodium 138, potassium  3.7, chloride 100, glucose 119, BUN 19, creatinine 0.88, white count 7.9, hemoglobin 13.8, hematocrit 41.1, platelets 247.  SARS COVID-19 negative. Urinalysis negative for infection.  Brain MRI/MRA negative.  EKG 70 bpm, normal axis, right bundle branch block, sinus rhythm, no ST segment or T wave changes.  1.  Left central retinal artery occlusion.  Patient was admitted to the medical ward, he was placed on a remote telemetry monitor.  He had frequent neuro checks, physical therapy, Occupational Therapy and neurology evaluation. He had no other focal neurologic deficit besides visual impairment on his left eye. His lipid profile had a total cholesterol of 120, HDL 37, LDL 68 and triglycerides 77.  Further work-up with echocardiography had normal LV systolic function, with no thrombotic source.  Carotid ultrasonography with no significant stenosis.  Patient was loaded with 300 mg clopidogrel and then he was placed on dual antiplatelet therapy, aspirin 81 mg and clopidogrel 75 mg to take for 3 weeks and then continue aspirin alone.  Continue statin therapy and lifestyle modification. His telemetry monitor remained sinus rhythm during his hospitalization. Patient will need 30-day event heart monitor.  Because being a weekend cannot arrange heart monitor at this point, neurology is okay to have this arranged as an outpatient, patient is in agreement to this plan as well, I will call electrophysiology tomorrow Monday to have this scheduled as an outpatient.  2.  Hypertension.  Continue blood pressure control with losartan and hydrochlorothiazide.  3.  Controlled type 2 diabetes mellitus, hemoglobin A1c 6.0, dyslipidemia.  Patient continue diet control for his type 2 diabetes mellitus, continue atorvastatin.  4.  Chronic diastolic heart failure.  No signs of acute exacerbation.  5.  Obesity class II/obstructive sleep apnea.  Calculated BMI 35.2, follow-up as an outpatient.  6.  Anxiety.  Continue  BuSpar and Lexapro.  Discharge Diagnoses:  Principal Problem:   Central retinal artery occlusion of left eye Active Problems:   Anxiety, generalized   Dyslipidemia   Essential hypertension   Severe obesity (BMI 35.0-39.9) with comorbidity (HCC)   Chronic diastolic heart failure (HCC)   Severe obstructive sleep apnea   Retinal artery occlusion    Discharge Instructions   Allergies as of 04/02/2020      Reactions   Doxycycline Itching   As of 03/31/20, pt does not recall this reaction      Medication List    TAKE these medications   albuterol 108 (90 Base) MCG/ACT inhaler Commonly known as: VENTOLIN HFA TAKE 2 PUFFS BY MOUTH EVERY 6 HOURS AS NEEDED FOR WHEEZE OR SHORTNESS OF BREATH What changed: See the new instructions.   aspirin EC 81 MG tablet Take 81 mg by mouth daily.   atorvastatin 10 MG tablet Commonly known as: LIPITOR TAKE 1 TABLET BY MOUTH EVERYDAY AT BEDTIME What changed:   how much to take  how to take this  when to take this  additional instructions   busPIRone 10 MG tablet Commonly known as: BUSPAR Take 1.5 tablets (15 mg total) by mouth 2 (two) times daily. For anxiety   cholecalciferol 25 MCG (1000 UNIT) tablet Commonly known as: VITAMIN D3 Take 2,000 Units by mouth daily.   clopidogrel 75 MG tablet Commonly known as: PLAVIX Take 1 tablet (75 mg total) by mouth daily for 21 days.   escitalopram 20 MG tablet Commonly known as: LEXAPRO Take 1 tablet (20 mg total) by mouth daily.   Fish Oil 1000 MG Caps Take by mouth.   losartan-hydrochlorothiazide 50-12.5 MG tablet Commonly known as: Hyzaar Take 1 tablet by mouth daily.   vitamin C 500 MG tablet Commonly known as: ASCORBIC ACID Take 500 mg by mouth daily.   zinc sulfate 220 (50 Zn) MG capsule Take 220 mg by mouth daily.       Allergies  Allergen Reactions  . Doxycycline Itching    As of 03/31/20, pt does not recall this reaction    Consultations:  Neurology     Procedures/Studies: CT HEAD WO CONTRAST  Result Date: 03/30/2020 CLINICAL DATA:  Possible stroke with left facial numbness since yesterday which resolved. EXAM: CT HEAD WITHOUT CONTRAST TECHNIQUE: Contiguous axial images were obtained from the base of the skull through the vertex without intravenous contrast. COMPARISON:  None. FINDINGS: Brain: No evidence of acute infarction, hemorrhage, hydrocephalus, extra-axial collection or mass lesion/mass effect. Vascular: No hyperdense vessel or unexpected calcification. Skull: Normal. Negative for fracture or focal lesion. Sinuses/Orbits: No acute finding. Other: None. IMPRESSION: Normal head CT. Electronically Signed   By: Marin Olp M.D.   On: 03/30/2020 13:37   MR ANGIO HEAD WO CONTRAST  Result Date: 03/30/2020 CLINICAL DATA:  Initial evaluation for neuro deficit, stroke suspected. EXAM: MRI HEAD WITHOUT CONTRAST MRA HEAD WITHOUT CONTRAST TECHNIQUE: Multiplanar, multiecho pulse sequences of the brain and surrounding structures were obtained without intravenous contrast. Angiographic images of the head were obtained using MRA technique without contrast. COMPARISON:  Comparison made with prior CT from earlier the same day. FINDINGS: MRI HEAD FINDINGS Brain: Mild diffuse prominence of the CSF containing spaces compatible generalized age-related cerebral atrophy. Patchy T2/FLAIR hyperintensity within the periventricular and deep white matter both cerebral hemispheres most consistent with chronic small vessel  ischemic disease, mild in nature. No abnormal foci of restricted diffusion to suggest acute or subacute ischemia. Gray-white matter differentiation well maintained. No encephalomalacia to suggest chronic cortical infarction. No foci of susceptibility artifact to suggest acute or chronic intracranial hemorrhage. No mass lesion, midline shift or mass effect. Ventricles normal size without hydrocephalus. No extra-axial fluid collection. Pituitary gland  suprasellar region normal. Midline structures intact. Vascular: Major intracranial vascular flow voids are well maintained. Skull and upper cervical spine: Craniocervical junction within normal limits. Bone marrow signal intensity normal. No scalp soft tissue abnormality. Sinuses/Orbits: Globes and orbital soft tissues within normal limits. Paranasal sinuses are largely clear. Small bilateral mastoid effusions noted, of doubtful significance. Inner ear structures grossly normal. Other: None. MRA HEAD FINDINGS ANTERIOR CIRCULATION: Visualized distal cervical segments of the internal carotid arteries are widely patent with symmetric antegrade flow. Petrous, cavernous, and supraclinoid ICAs widely patent without stenosis or other abnormality. A1 segments widely patent. Normal anterior communicating artery complex. Anterior cerebral arteries patent to their distal aspects without stenosis. No M1 stenosis or occlusion. Normal right MCA bifurcation. Left MCA trifurcates. Distal MCA branches well perfused and symmetric. POSTERIOR CIRCULATION: Both vertebral arteries patent to the vertebrobasilar junction without stenosis. Right vertebral artery slightly dominant and mildly tortuous crossing the midline prior to the vertebrobasilar junction. Both picas patent. Basilar widely patent to its distal aspect without stenosis. Superior cerebral arteries patent bilaterally. Both PCAs primarily supplied via the basilar and are well perfused to their distal aspects. Prominent right posterior communicating artery noted. No intracranial aneurysm or other vascular abnormality. IMPRESSION: MRI HEAD IMPRESSION: 1. No acute intracranial abnormality. 2. Mild age-related cerebral atrophy with chronic small vessel ischemic disease. MRA HEAD IMPRESSION: Normal intracranial MRA. No large vessel occlusion, hemodynamically significant stenosis, or other acute vascular abnormality. No aneurysm. Electronically Signed   By: Jeannine Boga M.D.    On: 03/30/2020 21:47   MR BRAIN WO CONTRAST  Result Date: 03/30/2020 CLINICAL DATA:  Initial evaluation for neuro deficit, stroke suspected. EXAM: MRI HEAD WITHOUT CONTRAST MRA HEAD WITHOUT CONTRAST TECHNIQUE: Multiplanar, multiecho pulse sequences of the brain and surrounding structures were obtained without intravenous contrast. Angiographic images of the head were obtained using MRA technique without contrast. COMPARISON:  Comparison made with prior CT from earlier the same day. FINDINGS: MRI HEAD FINDINGS Brain: Mild diffuse prominence of the CSF containing spaces compatible generalized age-related cerebral atrophy. Patchy T2/FLAIR hyperintensity within the periventricular and deep white matter both cerebral hemispheres most consistent with chronic small vessel ischemic disease, mild in nature. No abnormal foci of restricted diffusion to suggest acute or subacute ischemia. Gray-white matter differentiation well maintained. No encephalomalacia to suggest chronic cortical infarction. No foci of susceptibility artifact to suggest acute or chronic intracranial hemorrhage. No mass lesion, midline shift or mass effect. Ventricles normal size without hydrocephalus. No extra-axial fluid collection. Pituitary gland suprasellar region normal. Midline structures intact. Vascular: Major intracranial vascular flow voids are well maintained. Skull and upper cervical spine: Craniocervical junction within normal limits. Bone marrow signal intensity normal. No scalp soft tissue abnormality. Sinuses/Orbits: Globes and orbital soft tissues within normal limits. Paranasal sinuses are largely clear. Small bilateral mastoid effusions noted, of doubtful significance. Inner ear structures grossly normal. Other: None. MRA HEAD FINDINGS ANTERIOR CIRCULATION: Visualized distal cervical segments of the internal carotid arteries are widely patent with symmetric antegrade flow. Petrous, cavernous, and supraclinoid ICAs widely patent  without stenosis or other abnormality. A1 segments widely patent. Normal anterior communicating artery complex. Anterior cerebral arteries  patent to their distal aspects without stenosis. No M1 stenosis or occlusion. Normal right MCA bifurcation. Left MCA trifurcates. Distal MCA branches well perfused and symmetric. POSTERIOR CIRCULATION: Both vertebral arteries patent to the vertebrobasilar junction without stenosis. Right vertebral artery slightly dominant and mildly tortuous crossing the midline prior to the vertebrobasilar junction. Both picas patent. Basilar widely patent to its distal aspect without stenosis. Superior cerebral arteries patent bilaterally. Both PCAs primarily supplied via the basilar and are well perfused to their distal aspects. Prominent right posterior communicating artery noted. No intracranial aneurysm or other vascular abnormality. IMPRESSION: MRI HEAD IMPRESSION: 1. No acute intracranial abnormality. 2. Mild age-related cerebral atrophy with chronic small vessel ischemic disease. MRA HEAD IMPRESSION: Normal intracranial MRA. No large vessel occlusion, hemodynamically significant stenosis, or other acute vascular abnormality. No aneurysm. Electronically Signed   By: Jeannine Boga M.D.   On: 03/30/2020 21:47   ECHOCARDIOGRAM COMPLETE  Result Date: 03/31/2020    ECHOCARDIOGRAM REPORT   Patient Name:   Derek Ryan Date of Exam: 03/31/2020 Medical Rec #:  [id number]      Height:       68.0 in Accession #:    [id number]     Weight:       232.0 lb Date of Birth:  08/26/1955     BSA:          2.177 m Patient Age:    50 years       BP:           131/54 mmHg Patient Gender: M              HR:           70 bpm. Exam Location:  Inpatient Procedure: 2D Echo Indications:    stroke 434.91  History:        Patient has no prior history of Echocardiogram examinations.                 Risk Factors:Sleep Apnea, Hypertension and Dyslipidemia.  Sonographer:    Johny Chess Referring Phys:  [place]  1. Left ventricular ejection fraction, by estimation, is 60 to 65%. The left ventricle has normal function. The left ventricle has no regional wall motion abnormalities. Left ventricular diastolic parameters are consistent with Grade I diastolic dysfunction (impaired relaxation).  2. Right ventricular systolic function is normal. The right ventricular size is normal.  3. The mitral valve is normal in structure. No evidence of mitral valve regurgitation. No evidence of mitral stenosis.  4. The aortic valve is normal in structure. Aortic valve regurgitation is not visualized. No aortic stenosis is present.  5. The inferior vena cava is normal in size with greater than 50% respiratory variability, suggesting right atrial pressure of 3 mmHg. FINDINGS  Left Ventricle: Left ventricular ejection fraction, by estimation, is 60 to 65%. The left ventricle has normal function. The left ventricle has no regional wall motion abnormalities. The left ventricular internal cavity size was normal in size. There is  no left ventricular hypertrophy. Left ventricular diastolic parameters are consistent with Grade I diastolic dysfunction (impaired relaxation). Indeterminate filling pressures. Right Ventricle: The right ventricular size is normal. No increase in right ventricular wall thickness. Right ventricular systolic function is normal. Left Atrium: Left atrial size was normal in size. Right Atrium: Right atrial size was normal in size. Pericardium: There is no evidence of pericardial effusion. Mitral Valve: The mitral valve is normal in structure. Normal mobility of the mitral valve  leaflets. No evidence of mitral valve regurgitation. No evidence of mitral valve stenosis. Tricuspid Valve: The tricuspid valve is normal in structure. Tricuspid valve regurgitation is not demonstrated. No evidence of tricuspid stenosis. Aortic Valve: The aortic valve is normal in structure. Aortic valve regurgitation is  not visualized. No aortic stenosis is present. Pulmonic Valve: The pulmonic valve was normal in structure. Pulmonic valve regurgitation is not visualized. No evidence of pulmonic stenosis. Aorta: The aortic root is normal in size and structure. Venous: The inferior vena cava is normal in size with greater than 50% respiratory variability, suggesting right atrial pressure of 3 mmHg. IAS/Shunts: No atrial level shunt detected by color flow Doppler.  LEFT VENTRICLE PLAX 2D LVIDd:         4.30 cm Diastology LVIDs:         2.70 cm LV e' lateral:   10.10 cm/s LV PW:         1.10 cm LV E/e' lateral: 7.7 LV IVS:        1.00 cm LV e' medial:    7.72 cm/s                        LV E/e' medial:  10.1  RIGHT VENTRICLE             IVC RV S prime:     16.00 cm/s  IVC diam: 1.30 cm TAPSE (M-mode): 2.5 cm LEFT ATRIUM             Index       RIGHT ATRIUM           Index LA diam:        3.70 cm 1.70 cm/m  RA Area:     16.40 cm LA Vol (A2C):   47.1 ml 21.64 ml/m RA Volume:   43.00 ml  19.75 ml/m LA Vol (A4C):   38.0 ml 17.46 ml/m LA Biplane Vol: 43.3 ml 19.89 ml/m  AORTIC VALVE LVOT Vmax:   150.00 cm/s LVOT Vmean:  82.800 cm/s LVOT VTI:    0.280 m  AORTA Ao Root diam: 3.30 cm Ao Asc diam:  2.90 cm MITRAL VALVE MV Area (PHT): 2.69 cm     SHUNTS MV Decel Time: 282 msec     Systemic VTI: 0.28 m MV E velocity: 77.80 cm/s MV A velocity: 103.00 cm/s MV E/A ratio:  0.76 Mihai Croitoru MD Electronically signed by Sanda Klein MD Signature Date/Time: 03/31/2020/4:23:54 PM    Final    VAS US CAROTID (at [facility] and [facility] only)  Result Date: 04/01/2020 Carotid Arterial Duplex Study Indications:       Central retinal artery occlusion. Risk Factors:      Hypertension, hyperlipidemia. Comparison Study:  no prior Performing Technologist: Abram Sander RVS  Examination Guidelines: A complete evaluation includes B-mode imaging, spectral Doppler, color Doppler, and power Doppler as needed of all accessible portions of each vessel. Bilateral testing  is considered an integral part of a complete examination. Limited examinations for reoccurring indications may be performed as noted.  Right Carotid Findings: +----------+--------+--------+--------+------------------+--------+           PSV cm/sEDV cm/sStenosisPlaque DescriptionComments +----------+--------+--------+--------+------------------+--------+ CCA Prox  130     14              heterogenous               +----------+--------+--------+--------+------------------+--------+ CCA Distal101     19  heterogenous               +----------+--------+--------+--------+------------------+--------+ ICA Prox  86      11      1-39%   heterogenous               +----------+--------+--------+--------+------------------+--------+ ICA Distal98      21                                         +----------+--------+--------+--------+------------------+--------+ ECA       171     10                                         +----------+--------+--------+--------+------------------+--------+ +----------+--------+-------+--------+-------------------+           PSV cm/sEDV cmsDescribeArm Pressure (mmHG) +----------+--------+-------+--------+-------------------+ [id number]                                        +----------+--------+-------+--------+-------------------+ +---------+--------+--+--------+-+---------+ VertebralPSV cm/s39EDV cm/s7Antegrade +---------+--------+--+--------+-+---------+  Left Carotid Findings: +----------+--------+--------+--------+------------------+--------+           PSV cm/sEDV cm/sStenosisPlaque DescriptionComments +----------+--------+--------+--------+------------------+--------+ CCA Prox  118     26              heterogenous               +----------+--------+--------+--------+------------------+--------+ CCA Distal99      17              heterogenous                +----------+--------+--------+--------+------------------+--------+ ICA Prox  106     25      1-39%   heterogenous               +----------+--------+--------+--------+------------------+--------+ ICA Distal85      17                                         +----------+--------+--------+--------+------------------+--------+ ECA       224                                                +----------+--------+--------+--------+------------------+--------+ +----------+--------+--------+--------+-------------------+           PSV cm/sEDV cm/sDescribeArm Pressure (mmHG) +----------+--------+--------+--------+-------------------+ [id number]                                         +----------+--------+--------+--------+-------------------+ +---------+--------+--+--------+-+---------+ VertebralPSV cm/s40EDV cm/s9Antegrade +---------+--------+--+--------+-+---------+   Summary: Right Carotid: Velocities in the right ICA are consistent with a 1-39% stenosis. Left Carotid: Velocities in the left ICA are consistent with a 1-39% stenosis. Vertebrals: Bilateral vertebral arteries demonstrate antegrade flow. *See table(s) above for measurements and observations.  Electronically signed by Ruta Hinds MD on 04/01/2020 at 12:11:50 PM.    Final         Subjective: Patient is feeling well, continue to have visual impairment on his left eye, telemetry has remained sinus rhythm.  No other focal neurologic deficits.   Discharge Exam: Vitals:   04/01/20 2334 04/02/20 0356  BP: (!) 120/57 106/66  Pulse: 66 72  Resp: 18 18  Temp: 98.1 F (36.7 C) 98.2 F (36.8 C)  SpO2: 95% 96%   Vitals:   04/01/20 1544 04/01/20 2001 04/01/20 2334 04/02/20 0356  BP: (!) 111/56 132/63 (!) 120/57 106/66  Pulse: 62 68 66 72  Resp: 18 18 18 18   Temp: 98 F (36.7 C) 98.1 F (36.7 C) 98.1 F (36.7 C) 98.2 F (36.8 C)  TempSrc:  Oral Oral Oral  SpO2: 95% 97% 95% 96%  Weight:      Height:         General: Not in pain or dyspnea.  Neurology: Awake and alert, non focal. Persistent left eye visual impairment.  E ENT: no pallor, no icterus, oral mucosa moist Cardiovascular: No JVD. S1-S2 present, rhythmic, no gallops, rubs, or murmurs. No lower extremity edema. Pulmonary: positive breath sounds bilaterally, adequate air movement, no wheezing, rhonchi or rales. Gastrointestinal. Abdomen soft and non tender Skin. No rashes Musculoskeletal: no joint deformities   The results of significant diagnostics from this hospitalization (including imaging, microbiology, ancillary and laboratory) are listed below for reference.     Microbiology: Recent Results (from the past 240 hour(s))  SARS Coronavirus 2 by RT PCR (hospital order, performed in [facility] hospital lab) Nasopharyngeal Nasopharyngeal Swab     Status: None   Collection Time: 03/31/20 11:39 AM   Specimen: Nasopharyngeal Swab  Result Value Ref Range Status   SARS Coronavirus 2 NEGATIVE NEGATIVE Final    Comment: (NOTE) SARS-CoV-2 target nucleic acids are NOT DETECTED.  The SARS-CoV-2 RNA is generally detectable in upper and lower respiratory specimens during the acute phase of infection. The lowest concentration of SARS-CoV-2 viral copies this assay can detect is 250 copies / mL. A negative result does not preclude SARS-CoV-2 infection and should not be used as the sole basis for treatment or other patient management decisions.  A negative result may occur with improper specimen collection / handling, submission of specimen other than nasopharyngeal swab, presence of viral mutation(s) within the areas targeted by this assay, and inadequate number of viral copies (<250 copies / mL). A negative result must be combined with clinical observations, patient history, and epidemiological information.  Fact Sheet for Patients:   StrictlyIdeas.no  Fact Sheet for Healthcare  Providers: BankingDealers.co.za  This test is not yet approved or  cleared by the [place] FDA and has been authorized for detection and/or diagnosis of SARS-CoV-2 by FDA under an Emergency Use Authorization (EUA).  This EUA will remain in effect (meaning this test can be used) for the duration of the COVID-19 declaration under Section 564(b)(1) of the Act, 21 U.S.C. section 360bbb-3(b)(1), unless the authorization is terminated or revoked sooner.  Performed at [facility], [place] [address], [place], [place] [address]      Labs: BNP (last 3 results) No results for input(s): BNP in the last 8760 hours. Basic Metabolic Panel: Recent Labs  Lab 03/30/20 1030 03/30/20 1324 03/31/20 1135  NA 139 140 138  K 3.4* 3.5 3.7  CL 102 100 100  CO2 26  --  29  GLUCOSE 174* 94 119*  BUN 16 17 19   CREATININE 0.81 0.80 0.88  CALCIUM 9.2  --  9.3   Liver Function Tests: Recent Labs  Lab 03/30/20 1030 03/31/20 1135  AST 23 22  ALT 23 24  ALKPHOS  68 63  BILITOT 1.2 1.0  PROT 7.1 7.0  ALBUMIN 4.2 4.1   No results for input(s): LIPASE, AMYLASE in the last 168 hours. No results for input(s): AMMONIA in the last 168 hours. CBC: Recent Labs  Lab 03/30/20 1030 03/30/20 1324 03/31/20 1135  WBC 6.8  --  7.9  NEUTROABS 3.8  --  4.2  HGB 14.0 14.3 13.8  HCT 42.1 42.0 41.1  MCV 89.8  --  91.1  PLT 247  --  247   Cardiac Enzymes: No results for input(s): CKTOTAL, CKMB, CKMBINDEX, TROPONINI in the last 168 hours. BNP: Invalid input(s): POCBNP CBG: Recent Labs  Lab 04/01/20 0623 04/01/20 1115 04/01/20 1543 04/01/20 2111 04/02/20 0614  GLUCAP 105* 95 118* 126* 107*   D-Dimer No results for input(s): DDIMER in the last 72 hours. Hgb A1c Recent Labs    03/31/20 1340  HGBA1C 6.5*   Lipid Profile Recent Labs    04/01/20 0256  CHOL 120  HDL 37*  LDLCALC 68  TRIG 77  CHOLHDL 3.2   Thyroid function studies Recent Labs     03/31/20 1340  TSH 1.689   Anemia work up No results for input(s): VITAMINB12, FOLATE, FERRITIN, TIBC, IRON, RETICCTPCT in the last 72 hours. Urinalysis    Component Value Date/Time   COLORURINE YELLOW 03/30/2020 [place] 03/30/2020 1345   LABSPEC 1.010 03/30/2020 1345   PHURINE 7.0 03/30/2020 1345   GLUCOSEU NEGATIVE 03/30/2020 1345   HGBUR NEGATIVE 03/30/2020 1345   BILIRUBINUR NEGATIVE 03/30/2020 1345   KETONESUR NEGATIVE 03/30/2020 1345   PROTEINUR NEGATIVE 03/30/2020 1345   NITRITE NEGATIVE 03/30/2020 1345   LEUKOCYTESUR NEGATIVE 03/30/2020 1345   Sepsis Labs Invalid input(s): PROCALCITONIN,  WBC,  LACTICIDVEN Microbiology Recent Results (from the past 240 hour(s))  SARS Coronavirus 2 by RT PCR (hospital order, performed in [place] hospital lab) Nasopharyngeal Nasopharyngeal Swab     Status: None   Collection Time: 03/31/20 11:39 AM   Specimen: Nasopharyngeal Swab  Result Value Ref Range Status   SARS Coronavirus 2 NEGATIVE NEGATIVE Final    Comment: (NOTE) SARS-CoV-2 target nucleic acids are NOT DETECTED.  The SARS-CoV-2 RNA is generally detectable in upper and lower respiratory specimens during the acute phase of infection. The lowest concentration of SARS-CoV-2 viral copies this assay can detect is 250 copies / mL. A negative result does not preclude SARS-CoV-2 infection and should not be used as the sole basis for treatment or other patient management decisions.  A negative result may occur with improper specimen collection / handling, submission of specimen other than nasopharyngeal swab, presence of viral mutation(s) within the areas targeted by this assay, and inadequate number of viral copies (<250 copies / mL). A negative result must be combined with clinical observations, patient history, and epidemiological information.  Fact Sheet for Patients:   StrictlyIdeas.no  Fact Sheet for Healthcare  Providers: BankingDealers.co.za  This test is not yet approved or  cleared by the [place] FDA and has been authorized for detection and/or diagnosis of SARS-CoV-2 by FDA under an Emergency Use Authorization (EUA).  This EUA will remain in effect (meaning this test can be used) for the duration of the COVID-19 declaration under Section 564(b)(1) of the Act, 21 U.S.C. section 360bbb-3(b)(1), unless the authorization is terminated or revoked sooner.  Performed at [facility], [place] [address], [place], [place] [address]      Time coordinating discharge: 45 minutes  SIGNED:   Jimmy Picket  , MD  [facility] 04/02/2020, 8:35 AM

## 2020-04-03 ENCOUNTER — Telehealth: Payer: Self-pay

## 2020-04-03 ENCOUNTER — Telehealth: Payer: Self-pay | Admitting: Family Medicine

## 2020-04-03 DIAGNOSIS — H349 Unspecified retinal vascular occlusion: Secondary | ICD-10-CM

## 2020-04-03 NOTE — Telephone Encounter (Signed)
Patient called and advised he was relesed from the hospital on 04/02/2020 and he had a heart monitor on while inpatient , He was told by hospital staff that he needed to follow up with [facility] to have another monitor put on but is not sure why he would need another one and verify how soon it would need to be put on if needed . Please advise ? Derek KitchenMarland KitchenMarland KitchenA.S

## 2020-04-03 NOTE — Telephone Encounter (Signed)
Patient diagnosed with central retinal artery occlusion, needs outpatient telemetry live for evaluation of possible atrial fibrillation.  Orders placed.    ICD-10-CM   1. Retinal artery occlusion  H34.9 LONG TERM MONITOR-LIVE TELEMETRY (3-14 DAYS)    Adrian Prows, MD, [facility] 04/03/2020, 3:05 PM Office: [phone]

## 2020-04-03 NOTE — Telephone Encounter (Signed)
TRANSITIONAL CARE MANAGEMENT TELEPHONE OUTREACH NOTE   Contact Date: 04/03/2020 Contacted By: Berger INFORMATION Date of Discharge:04/02/2020 [facility] Discharge Diagnosis:central retinal artery occlusion left eye   Outpatient Follow Up Recommendations (copied from discharge summary) Recommendations for Outpatient Follow-up and new medication changes:  1. Follow up with Dr. Lajuana Ripple in 7 days.  2. Patient has been placed on dual antiplatelet therapy with aspirin and clopidogrel for 3 weeks, then continue aspirin alone.  3. Not driving until cleared by ophthalmology 4. Follow up with Neurology in one to two weeks. 5. Follow up with EP for 30 day event cardiac monitoring.    Derek Ryan is a male primary care patient of Janora Norlander, DO. An outgoing telephone call was made today and I spoke with Derek Ryan.  Derek Ryan condition(s) and treatment(s) were discussed. An opportunity to ask questions was provided and all were answered or forwarded as appropriate.    ACTIVITIES OF DAILY LIVING  Derek Ryan lives alone and he can perform ADLs independently. his primary caregiver is hisself. he is able to depend on his primary caregiver(s) for consistent help. Transportation to appointments, to pick up medications, and to run errands is not a problem.  (Consider referral to [place] if transportation or a consistent caregiver is a problem)   Fall Risk Fall Risk  12/13/2019 07/26/2019  Falls in the past year? 1 0  Number falls in past yr: 0 -  Injury with Fall? 0 -  Risk for fall due to : History of fall(s) -  Follow up Falls prevention discussed -    low [place] Modifications/Assistive Devices Wheelchair: No Cane: Yes Ramp: No Bedside Toilet: No Hospital Bed:  No Other:    [place] he is not receiving home health  services.    MEDICATION RECONCILIATION  Derek Ryan has been able to pick-up all  prescribed discharge medications from the pharmacy.   A post discharge medication reconciliation was performed and the complete medication list was reviewed with the patient/caregiver and is current as of 04/03/2020. Changes highlighted below.  Discontinued Medications   Current Medication List Allergies as of 04/03/2020      Reactions   Doxycycline Itching   As of 03/31/20, pt does not recall this reaction      Medication List       Accurate as of April 03, 2020  8:53 AM. If you have any questions, ask your nurse or doctor.        albuterol 108 (90 Base) MCG/ACT inhaler Commonly known as: VENTOLIN HFA TAKE 2 PUFFS BY MOUTH EVERY 6 HOURS AS NEEDED FOR WHEEZE OR SHORTNESS OF BREATH What changed: See the new instructions.   aspirin EC 81 MG tablet Take 81 mg by mouth daily.   atorvastatin 10 MG tablet Commonly known as: LIPITOR TAKE 1 TABLET BY MOUTH EVERYDAY AT BEDTIME What changed:   how much to take  how to take this  when to take this  additional instructions   busPIRone 10 MG tablet Commonly known as: BUSPAR Take 1.5 tablets (15 mg total) by mouth 2 (two) times daily. For anxiety   cholecalciferol 25 MCG (1000 UNIT) tablet Commonly known as: VITAMIN D3 Take 2,000 Units by mouth daily.   clopidogrel 75 MG tablet Commonly known as: PLAVIX Take 1 tablet (75 mg total) by mouth daily for 21 days.   escitalopram 20 MG tablet Commonly known as:  LEXAPRO Take 1 tablet (20 mg total) by mouth daily.   Fish Oil 1000 MG Caps Take by mouth.   losartan-hydrochlorothiazide 50-12.5 MG tablet Commonly known as: Hyzaar Take 1 tablet by mouth daily.   vitamin C 500 MG tablet Commonly known as: ASCORBIC ACID Take 500 mg by mouth daily.   zinc sulfate 220 (50 Zn) MG capsule Take 220 mg by mouth daily.        PATIENT EDUCATION & FOLLOW-UP PLAN  An appointment for Transitional Care Management is scheduled with Jac Canavan on 04/07/20 at 11 am  Take all  medications as prescribed  Contact our office by calling [phone] if you have any questions or concerns

## 2020-04-03 NOTE — Telephone Encounter (Signed)
I have placed the orders.  

## 2020-04-07 ENCOUNTER — Other Ambulatory Visit: Payer: Self-pay

## 2020-04-07 ENCOUNTER — Ambulatory Visit: Payer: Medicare Other

## 2020-04-07 ENCOUNTER — Ambulatory Visit (INDEPENDENT_AMBULATORY_CARE_PROVIDER_SITE_OTHER): Payer: Medicare Other | Admitting: Nurse Practitioner

## 2020-04-07 ENCOUNTER — Encounter: Payer: Self-pay | Admitting: Nurse Practitioner

## 2020-04-07 VITALS — BP 129/67 | HR 81 | Temp 98.2°F | Ht 68.0 in | Wt 236.6 lb

## 2020-04-07 DIAGNOSIS — H349 Unspecified retinal vascular occlusion: Secondary | ICD-10-CM

## 2020-04-07 DIAGNOSIS — Z09 Encounter for follow-up examination after completed treatment for conditions other than malignant neoplasm: Secondary | ICD-10-CM

## 2020-04-07 DIAGNOSIS — H34232 Retinal artery branch occlusion, left eye: Secondary | ICD-10-CM

## 2020-04-07 HISTORY — DX: Retinal artery branch occlusion, left eye: H34.232

## 2020-04-07 NOTE — Patient Instructions (Signed)
 Hospital Discharge After a Stroke  Being discharged from the hospital after a stroke can feel overwhelming. Many things may be different, and it is normal to feel scared or anxious. Some stroke survivors may be able to return to their homes, and others may need more specialized care on a temporary or permanent basis. Your stroke care team will work with you to develop a discharge plan that is best for you. Ask questions if you do not understand something. Invite a friend or family member to participate in discharge planning. Understanding and following your discharge plan can help to prevent another stroke or other problems. Understanding your medicines After a stroke, your health care provider may prescribe one or more types of medicine. It is important to take medicines exactly as told by your health care provider. Serious harm, such as another stroke, can happen if you are unable to take your medicine exactly as prescribed. Make sure you understand:  What medicine to take.  Why you are taking the medicine.  How and when to take it.  If it can be taken with your other medicines and herbal supplements.  Possible side effects.  When to call your health care provider if you have any side effects.  How you will get and pay for your medicines. Medical assistance programs may be able to help you pay for prescription medicines if you cannot afford them. If you are taking an anticoagulant, be sure to take it exactly as told by your health care provider. This type of medicine can increase the risk of bleeding because it works to prevent blood from clotting. You may need to take certain precautions to prevent bleeding. You should contact your health care provider if you have:  Bleeding or bruising.  A fall or other injury to your head.  Blood in your urine or stool (feces). Planning for home safety  Take steps to prevent falls, such as installing grab bars or using a shower chair. Ask a  friend or family member to get needed things in place before you go home if possible. A therapist can come to your home to make recommendations for safety equipment. Ask your health care provider if you would benefit from this service or from home care. Getting needed equipment Ask your health care provider for a list of any medical equipment and supplies you will need at home. These may include items such as:  Walkers.  Canes.  Wheelchairs.  Hand-strengthening devices.  Special eating utensils. Medical equipment can be rented or purchased, depending on your insurance coverage. Check with your insurance company about what is covered. Keeping follow-up visits After a stroke, you will need to follow up regularly with a health care provider. You may also need rehabilitation, which can include physical therapy, occupational therapy, or speech-language therapy. Keeping these appointments is very important to your recovery after a stroke. Be sure to bring your medicine list and discharge papers with you to your appointments. If you need help to keep track of your schedule, use a calendar or appointment reminder. Preventing another stroke Having a stroke puts you at risk for another stroke in the future. Ask your health care provider what actions you can take to lower the risk. These may include:  Increasing how much you exercise.  Making a healthy eating plan.  Quitting smoking.  Managing other health conditions, such as high blood pressure, high cholesterol, or diabetes.  Limiting alcohol use. Knowing the warning signs of a stroke  Make   sure you understand the signs of a stroke. Before you leave the hospital, you will receive information outlining the stroke warning signs. Share these with your friends and family members. "BE FAST" is an easy way to remember the main warning signs of a stroke:  B - Balance. Signs are dizziness, sudden trouble walking, or loss of balance.  E - Eyes.  Signs are trouble seeing or a sudden change in vision.  F - Face. Signs are sudden weakness or numbness of the face, or the face or eyelid drooping on one side.  A - Arms. Signs are weakness or numbness in an arm. This happens suddenly and usually on one side of the body.  S - Speech. Signs are sudden trouble speaking, slurred speech, or trouble understanding what people say.  T - Time. Time to call emergency services. Write down what time symptoms started. Other signs of stroke may include:  A sudden, severe headache with no known cause.  Nausea or vomiting.  Seizure. These symptoms may represent a serious problem that is an emergency. Do not wait to see if the symptoms will go away. Get medical help right away. Call your local emergency services (911 in the [place] Do not drive yourself to the hospital. Make note of the time that you had your first symptoms. Your emergency responders or emergency room staff will need to know this information. Summary  Being discharged from the hospital after a stroke can feel overwhelming. It is normal to feel scared or anxious.  Make sure you take medicines exactly as told by your health care provider.  Know the warning signs of a stroke, and get help right way if you have any of these symptoms. "BE FAST" is an easy way to remember the main warning signs of a stroke. This information is not intended to replace advice given to you by your health care provider. Make sure you discuss any questions you have with your health care provider. Document Revised: 04/28/2019 Document Reviewed: 11/08/2016 Elsevier Patient Education  2020 Elsevier Inc.  

## 2020-04-07 NOTE — Progress Notes (Signed)
Established Patient Office Visit  Subjective:  Patient ID: Derek Ryan, male    DOB: [birth year]  Age: 64 y.o. MRN: [id number]  CC:  Chief Complaint  Patient presents with  . Hospitalization Follow-up    TOC    HPI Derek Ryan presents for Today's visit was for Transitional Care Management.  The patient was discharged from [facility] on 04/02/20 with a primary diagnosis of Retinal artery occlusion left eye  Contact with the patient and/or caregiver, by a clinical staff member, was made on 04/03/20 and was documented as a telephone encounter within the EMR.  Through chart review and discussion with the patient I have determined that management of their condition is of high complexity.     Past Medical History:  Diagnosis Date  . Central retinal artery occlusion of left eye   . Chronic diastolic heart failure ([place]) 08/25/2019  . Depression   . GERD (gastroesophageal reflux disease)   . Hyperlipidemia   . Hypertension   . 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 OSA (obstructive sleep apnea)    unable to tolerate CPAP      Family History  Problem Relation Age of Onset  . Heart attack Mother   . Alcohol abuse Father   . Stomach cancer Sister   . Hypertension Brother   . Brain cancer Brother     Social History   Socioeconomic History  . Marital status: Widowed    Spouse name: Not on file  . Number of children: 3  . Years of education: Not on file  . Highest education level: Not on file  Occupational History  . 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 Smoking status: Former Smoker    Packs/day: 1.00    Years: 23.00    Pack years: 23.00    Types: Cigarettes    Quit date: 1992    Years since quitting: 29.6  . Smokeless tobacco: Never Used  Vaping Use  . Vaping Use: Never used  Substance and Sexual Activity  . Alcohol use: Not Currently    Comment: stopped in 1997  . Drug use: Never  . Sexual activity:  Not on file  Other Topics Concern  . Not on file  Social History Narrative  . Not on file   Social Determinants of Health   Financial Resource Strain:   . Difficulty of Paying Living Expenses: Not on file  Food Insecurity:   . Worried About Charity fundraiser in the Last Year: Not on file  . Ran Out of Food in the Last Year: Not on file  Transportation Needs:   . Lack of Transportation (Medical): Not on file  . Lack of Transportation (Non-Medical): Not on file  Physical Activity:   . Days of Exercise per Week: Not on file  . Minutes of Exercise per Session: Not on file  Stress:   . Feeling of Stress : Not on file  Social Connections:   . Frequency of Communication with Friends and Family: Not on file  . Frequency of Social Gatherings with Friends and Family: Not on file  . Attends Religious Services: Not on file  . Active Member of Clubs or Organizations: Not on file  . Attends Archivist Meetings: Not on file  . Marital Status: Not on file  Intimate Partner Violence:   . Fear of Current or Ex-Partner: Not on file  . Emotionally Abused: Not on  file  . Physically Abused: Not on file  . Sexually Abused: Not on file    Outpatient Medications Prior to Visit  Medication Sig Dispense Refill  . albuterol (VENTOLIN HFA) 108 (90 Base) MCG/ACT inhaler TAKE 2 PUFFS BY MOUTH EVERY 6 HOURS AS NEEDED FOR WHEEZE OR SHORTNESS OF BREATH (Patient taking differently: Inhale 2 puffs into the lungs every 6 (six) hours as needed for wheezing or shortness of breath. ) 18 g 1  . aspirin EC 81 MG tablet Take 81 mg by mouth daily.    [name] atorvastatin (LIPITOR) 10 MG tablet TAKE 1 TABLET BY MOUTH EVERYDAY AT BEDTIME (Patient taking differently: Take 10 mg by mouth at bedtime. ) 90 tablet 3  . busPIRone (BUSPAR) 10 MG tablet Take 1.5 tablets (15 mg total) by mouth 2 (two) times daily. For anxiety 270 tablet 3  . cholecalciferol (VITAMIN D3) 25 MCG (1000 UT) tablet Take 2,000 Units by mouth  daily.    . clopidogrel (PLAVIX) 75 MG tablet Take 1 tablet (75 mg total) by mouth daily for 21 days. 21 tablet 0  . escitalopram (LEXAPRO) 20 MG tablet Take 1 tablet (20 mg total) by mouth daily. 90 tablet 3  . losartan-hydrochlorothiazide (HYZAAR) 50-12.5 MG tablet TAKE 1 TABLET BY MOUTH EVERY DAY 90 tablet 2  . Omega-3 Fatty Acids (FISH OIL) 1000 MG CAPS Take by mouth.    . vitamin C (ASCORBIC ACID) 500 MG tablet Take 500 mg by mouth daily.    [name] zinc sulfate 220 (50 Zn) MG capsule Take 220 mg by mouth daily.     No facility-administered medications prior to visit.    Allergies  Allergen Reactions  . Doxycycline Itching    As of 03/31/20, pt does not recall this reaction    ROS Review of Systems  Constitutional: Negative.   HENT: Negative.   Eyes:       Mild blurry vision in the left eye.  Respiratory: Negative.   Cardiovascular: Negative.   Gastrointestinal: Negative.   Genitourinary: Negative.   Musculoskeletal: Negative.   Skin: Negative.   Neurological:       Retinal artery occlusion left eye  Psychiatric/Behavioral: Negative for self-injury and suicidal ideas. The patient is not nervous/anxious.       Objective:    Physical Exam Vitals reviewed.  Constitutional:      Appearance: Normal appearance.  HENT:     Head: Normocephalic.  Eyes:     Conjunctiva/sclera: Conjunctivae normal.     Comments: Mildly blurry vision in the left eye,  Cardiovascular:     Rate and Rhythm: Normal rate and regular rhythm.     Pulses: Normal pulses.     Heart sounds: Normal heart sounds.  Pulmonary:     Effort: Pulmonary effort is normal.     Breath sounds: Normal breath sounds.  Abdominal:     General: Bowel sounds are normal.  Musculoskeletal:        General: Normal range of motion.  Skin:    General: Skin is warm.  Neurological:     Mental Status: He is alert and oriented to person, place, and time.     Comments: Retinal artery occlusion left eye     BP 129/67    Pulse 81   Temp 98.2 F (36.8 C)   Ht 5\' 8"  (1.727 m)   Wt 236 lb 9.6 oz (107.3 kg)   SpO2 96%   BMI 35.97 kg/m  Wt Readings from Last 3 Encounters:  04/07/20 236 lb 9.6 oz (107.3 kg)  04/01/20 236 lb (107 kg)  03/30/20 232 lb (105.2 kg)     Health Maintenance Due  Topic Date Due  . INFLUENZA VACCINE  03/19/2020    There are no preventive care reminders to display for this patient.  Lab Results  Component Value Date   TSH 1.689 03/31/2020   Lab Results  Component Value Date   WBC 7.9 03/31/2020   HGB 13.8 03/31/2020   HCT 41.1 03/31/2020   MCV 91.1 03/31/2020   PLT 247 03/31/2020   Lab Results  Component Value Date   NA 138 03/31/2020   K 3.7 03/31/2020   CO2 29 03/31/2020   GLUCOSE 119 (H) 03/31/2020   BUN 19 03/31/2020   CREATININE 0.88 03/31/2020   BILITOT 1.0 03/31/2020   ALKPHOS 63 03/31/2020   AST 22 03/31/2020   ALT 24 03/31/2020   PROT 7.0 03/31/2020   ALBUMIN 4.1 03/31/2020   CALCIUM 9.3 03/31/2020   ANIONGAP 9 03/31/2020   Lab Results  Component Value Date   CHOL 120 04/01/2020   Lab Results  Component Value Date   HDL 37 (L) 04/01/2020   Lab Results  Component Value Date   LDLCALC 68 04/01/2020   Lab Results  Component Value Date   TRIG 77 04/01/2020   Lab Results  Component Value Date   CHOLHDL 3.2 04/01/2020   Lab Results  Component Value Date   HGBA1C 6.5 (H) 03/31/2020      Assessment & Plan:   Problem List Items Addressed This Visit      Cardiovascular and Mediastinum   Retinal artery branch occlusion, left eye    Patient was admitted to the hospital with retinal artery occlusion in the left eye.  Patient is reporting mild blurry vision in the left eye today, going over transition of care management patient has already visited with ophthalmology and has the permission to drive but with caution.  Patient will follow up with upcoming appointments with ophthalmology in the future.  Follow-up with unresolved or worsening  symptoms.        Other   Hospital discharge follow-up - Primary    Patient is a 64 year old male who is in clinic today for hospital discharge follow-up from [facility].  Patient was discharged 04/02/2020 with a primary diagnosis of retinal artery occlusion in the left eye.  Patient reports that he is getting better with no signs of headache.  Patient is reporting mild blurred vision in the left eye.  Completed transition of care visit going over patient's medication reconciliation, upcoming appointments with cardiology, ophthalmology, and neurology.  Patient verbalized understanding.  Provided education with printed handouts given to patient. Patient is allowed to drive with caution.  Patient follow-up with worsening or unresolved symptoms.         No orders of the defined types were placed in this encounter.   Follow-up: No follow-ups on file.    Ivy Lynn, NP

## 2020-04-07 NOTE — Assessment & Plan Note (Signed)
Patient was admitted to the hospital with retinal artery occlusion in the left eye.  Patient is reporting mild blurry vision in the left eye today, going over transition of care management patient has already visited with ophthalmology and has the permission to drive but with caution.  Patient will follow up with upcoming appointments with ophthalmology in the future.  Follow-up with unresolved or worsening symptoms.

## 2020-04-07 NOTE — Assessment & Plan Note (Signed)
Patient is a 64 year old male who is in clinic today for hospital discharge follow-up from [facility].  Patient was discharged 04/02/2020 with a primary diagnosis of retinal artery occlusion in the left eye.  Patient reports that he is getting better with no signs of headache.  Patient is reporting mild blurred vision in the left eye.  Completed transition of care visit going over patient's medication reconciliation, upcoming appointments with cardiology, ophthalmology, and neurology.  Patient verbalized understanding.  Provided education with printed handouts given to patient. Patient is allowed to drive with caution.  Patient follow-up with worsening or unresolved symptoms.

## 2020-04-08 DIAGNOSIS — H349 Unspecified retinal vascular occlusion: Secondary | ICD-10-CM | POA: Diagnosis not present

## 2020-04-11 ENCOUNTER — Encounter: Payer: Self-pay | Admitting: *Deleted

## 2020-04-20 ENCOUNTER — Encounter: Payer: Self-pay | Admitting: Family Medicine

## 2020-04-20 ENCOUNTER — Ambulatory Visit (INDEPENDENT_AMBULATORY_CARE_PROVIDER_SITE_OTHER): Payer: Medicare Other | Admitting: Family Medicine

## 2020-04-20 ENCOUNTER — Other Ambulatory Visit: Payer: Self-pay

## 2020-04-20 VITALS — BP 111/54 | HR 98 | Temp 98.3°F | Ht 68.0 in | Wt 237.2 lb

## 2020-04-20 DIAGNOSIS — Z0000 Encounter for general adult medical examination without abnormal findings: Secondary | ICD-10-CM | POA: Diagnosis not present

## 2020-04-20 DIAGNOSIS — Z7189 Other specified counseling: Secondary | ICD-10-CM

## 2020-04-20 DIAGNOSIS — N3943 Post-void dribbling: Secondary | ICD-10-CM

## 2020-04-20 NOTE — Patient Instructions (Signed)
Thank you for coming in today for your Annual Medicare Wellness Visit.  Things that we discussed today are included in this packet.  Create and/or bring a copy of your Living Will/ Advanced Directive into the office so that we may respect your wishes should an emergency occur.  Get the recommended life-saving vaccines we discussed today.  Get your mammogram/ colonoscopy/ DEXA scans as directed by your provider.  Make sure that your medications are organized and safely stored.  Remember to always ask for help if you forget when/ how to take your medications.  Make healthy food choices (Rich in fruits/ veggies/ lean meats and low in salt, sugar and fat)  Do something that you enjoy for at least 30 minutes every day to stay active (walking, gardening, swimming, etc). This will help you lower your risk of falls/ broken bones.  Be social, do puzzles/ crosswords.  These things help the mind stay young and lower your risk of developing dementia.  Make sure that your home is safe by checking your smoke detectors regularly and doing the things outlined below to lower your risk of falls.  Fall Prevention in the Home Falls can cause injuries and can affect people from all age groups. There are many simple things that you can do to make your home safe and to help prevent falls. What can I do on the outside of my home? Regularly repair the edges of walkways and driveways and fix any cracks. Remove high doorway thresholds. Trim any shrubbery on the main path into your home. Use bright outdoor lighting. Clear walkways of debris and clutter, including tools and rocks. Regularly check that handrails are securely fastened and in good repair. Both sides of any steps should have handrails. Install guardrails along the edges of any raised decks or porches. Have leaves, snow, and ice cleared regularly. Use sand or salt on walkways during winter months. In the garage, clean up any spills right away, including  grease or oil spills. What can I do in the bathroom? Use night lights. Install grab bars by the toilet and in the tub and shower. Do not use towel bars as grab bars. Use non-skid mats or decals on the floor of the tub or shower. If you need to sit down while you are in the shower, use a plastic, non-slip stool. Keep the floor dry. Immediately clean up any water that spills on the floor. Remove soap buildup in the tub or shower on a regular basis. Attach bath mats securely with double-sided non-slip rug tape. Remove throw rugs and other tripping hazards from the floor. What can I do in the bedroom? Use night lights. Make sure that a bedside light is easy to reach. Do not use oversized bedding that drapes onto the floor. Have a firm chair that has side arms to use for getting dressed. Remove throw rugs and other tripping hazards from the floor. What can I do in the kitchen? Clean up any spills right away. Avoid walking on wet floors. Place frequently used items in easy-to-reach places. If you need to reach for something above you, use a sturdy step stool that has a grab bar. Keep electrical cables out of the way. Do not use floor polish or wax that makes floors slippery. If you have to use wax, make sure that it is non-skid floor wax. Remove throw rugs and other tripping hazards from the floor. What can I do in the stairways? Do not leave any items on the stairs.   Make sure that there are handrails on both sides of the stairs. Fix handrails that are broken or loose. Make sure that handrails are as long as the stairways. Check any carpeting to make sure that it is firmly attached to the stairs. Fix any carpet that is loose or worn. Avoid having throw rugs at the top or bottom of stairways, or secure the rugs with carpet tape to prevent them from moving. Make sure that you have a light switch at the top of the stairs and the bottom of the stairs. If you do not have them, have them  installed. What are some other fall prevention tips? Wear closed-toe shoes that fit well and support your feet. Wear shoes that have rubber soles or low heels. When you use a stepladder, make sure that it is completely opened and that the sides are firmly locked. Have someone hold the ladder while you are using it. Do not climb a closed stepladder. Add color or contrast paint or tape to grab bars and handrails in your home. Place contrasting color strips on the first and last steps. Use mobility aids as needed, such as canes, walkers, scooters, and crutches. Turn on lights if it is dark. Replace any light bulbs that burn out. Set up furniture so that there are clear paths. Keep the furniture in the same spot. Fix any uneven floor surfaces. Choose a carpet design that does not hide the edge of steps of a stairway. Be aware of any and all pets. Review your medicines with your healthcare provider. Some medicines can cause dizziness or changes in blood pressure, which increase your risk of falling. Talk with your health care provider about other ways that you can decrease your risk of falls. This may include working with a physical therapist or trainer to improve your strength, balance, and endurance. This information is not intended to replace advice given to you by your health care provider. Make sure you discuss any questions you have with your health care provider. Document Released: 07/26/2002 Document Revised: 01/02/2016 Document Reviewed: 09/09/2014 Elsevier Interactive Patient Education  2017 [employer]  

## 2020-04-20 NOTE — Progress Notes (Signed)
Subjective:    Derek Ryan is a 64 y.o. male who presents for Medicare Initial preventive examination.   Preventive Screening-Counseling & Management  Tobacco Social History   Tobacco Use  Smoking Status Former Smoker  . Packs/day: 1.00  . Years: 23.00  . Pack years: 23.00  . Types: Cigarettes  . Quit date: 13  . Years since quitting: 29.6  Smokeless Tobacco Never Used    Problems Prior to Visit  1. Post void dribbling  PSA 0.6 in 2017 with [facility].  He does report some dribbling after urination but this is intermittent.  He does not report any nocturia, increased urinary frequency, urgency, incomplete bladder evacuation, dysuria, hematuria, pelvic pain or new onset low back pain.  2.  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  He is getting close to the end of Plavix and aspirin therapy and will be switching over to aspirin therapy solely.  He otherwise remains functional.  He is compliant with blood pressure regimen.  Sugar was under good control during that hospitalization.  He is currently wearing a Holter monitor to determine if he has some type of arrhythmia that precipitated the stroke.  Current Problems (verified) Patient Active Problem List   Diagnosis Date Noted  . Hospital discharge follow-up 04/07/2020  . Retinal artery branch occlusion, 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  . Retinal artery occlusion 04/01/2020  . Central retinal artery occlusion of left eye   . Hypertension   . Severe obstructive sleep apnea 12/13/2019  . 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 Chronic diastolic heart failure ([place]) 08/25/2019  . Hypertensive heart disease with chronic diastolic congestive heart failure ([place]) 08/25/2019  . Severe obesity (BMI 35.0-39.9) with comorbidity ([place]) 07/24/2018  . Moderate single current episode of major depressive disorder ([place]) 04/25/2016  . Elevated hemoglobin A1c 01/18/2016  .  Dyslipidemia 01/12/2016  . Essential hypertension 01/12/2016  . Gastroesophageal reflux disease without esophagitis 01/12/2016  . Vitamin D deficiency 01/12/2016  . Anxiety, generalized 03/30/2014  . Hyperlipidemia 03/30/2014    Medications Prior to Visit Current Outpatient Medications on File Prior to Visit  Medication Sig Dispense Refill  . albuterol (VENTOLIN HFA) 108 (90 Base) MCG/ACT inhaler TAKE 2 PUFFS BY MOUTH EVERY 6 HOURS AS NEEDED FOR WHEEZE OR SHORTNESS OF BREATH (Patient taking differently: Inhale 2 puffs into the lungs every 6 (six) hours as needed for wheezing or shortness of breath. ) 18 g 1  . aspirin EC 81 MG tablet Take 81 mg by mouth daily.    [name] atorvastatin (LIPITOR) 10 MG tablet TAKE 1 TABLET BY MOUTH EVERYDAY AT BEDTIME (Patient taking differently: Take 10 mg by mouth at bedtime. ) 90 tablet 3  . busPIRone (BUSPAR) 10 MG tablet Take 1.5 tablets (15 mg total) by mouth 2 (two) times daily. For anxiety 270 tablet 3  . cholecalciferol (VITAMIN D3) 25 MCG (1000 UT) tablet Take 2,000 Units by mouth daily.    . clopidogrel (PLAVIX) 75 MG tablet Take 1 tablet (75 mg total) by mouth daily for 21 days. 21 tablet 0  . escitalopram (LEXAPRO) 20 MG tablet Take 1 tablet (20 mg total) by mouth daily. 90 tablet 3  . losartan-hydrochlorothiazide (HYZAAR) 50-12.5 MG tablet TAKE 1 TABLET BY MOUTH EVERY DAY 90 tablet 2  . Omega-3 Fatty Acids (FISH OIL) 1000 MG CAPS Take by mouth.    . vitamin C (ASCORBIC ACID) 500 MG tablet Take 500 mg by  mouth daily.    [name] zinc sulfate 220 (50 Zn) MG capsule Take 220 mg by mouth daily.     No current facility-administered medications on file prior to visit.    Current Medications (verified) Current Outpatient Medications  Medication Sig Dispense Refill  . albuterol (VENTOLIN HFA) 108 (90 Base) MCG/ACT inhaler TAKE 2 PUFFS BY MOUTH EVERY 6 HOURS AS NEEDED FOR WHEEZE OR SHORTNESS OF BREATH (Patient taking differently: Inhale 2 puffs into the lungs every  6 (six) hours as needed for wheezing or shortness of breath. ) 18 g 1  . aspirin EC 81 MG tablet Take 81 mg by mouth daily.    [name] atorvastatin (LIPITOR) 10 MG tablet TAKE 1 TABLET BY MOUTH EVERYDAY AT BEDTIME (Patient taking differently: Take 10 mg by mouth at bedtime. ) 90 tablet 3  . busPIRone (BUSPAR) 10 MG tablet Take 1.5 tablets (15 mg total) by mouth 2 (two) times daily. For anxiety 270 tablet 3  . cholecalciferol (VITAMIN D3) 25 MCG (1000 UT) tablet Take 2,000 Units by mouth daily.    . clopidogrel (PLAVIX) 75 MG tablet Take 1 tablet (75 mg total) by mouth daily for 21 days. 21 tablet 0  . escitalopram (LEXAPRO) 20 MG tablet Take 1 tablet (20 mg total) by mouth daily. 90 tablet 3  . losartan-hydrochlorothiazide (HYZAAR) 50-12.5 MG tablet TAKE 1 TABLET BY MOUTH EVERY DAY 90 tablet 2  . Omega-3 Fatty Acids (FISH OIL) 1000 MG CAPS Take by mouth.    . vitamin C (ASCORBIC ACID) 500 MG tablet Take 500 mg by mouth daily.    [name] zinc sulfate 220 (50 Zn) MG capsule Take 220 mg by mouth daily.     No current facility-administered medications for this visit.     Allergies (verified) Doxycycline   PAST HISTORY  Family History Family History  Problem Relation Age of Onset  . Heart attack Mother   . Alcohol abuse Father   . Stomach cancer Sister   . Hypertension Brother   . Brain cancer Brother     Social History Social History   Tobacco Use  . Smoking status: Former Smoker    Packs/day: 1.00    Years: 23.00    Pack years: 23.00    Types: Cigarettes    Quit date: 1992    Years since quitting: 29.6  . Smokeless tobacco: Never Used  Substance Use Topics  . Alcohol use: Not Currently    Comment: stopped in 1997    Are there smokers in your home (other than you)?  No does not smoke  Risk Factors Current exercise habits: The patient does not participate in regular exercise at present.  Dietary issues discussed: trying to maintain healthy eating habits, particular given recent  CVA  Cardiac risk factors: advanced age (older than 54 for men, 28 for women), dyslipidemia, hypertension, male gender and obesity (BMI >= 30 kg/m2).  Depression Screen (Note: if answer to either of the following is "Yes", a more complete depression screening is indicated)   Q1: Over the past two weeks, have you felt down, depressed or hopeless? No  Q2: Over the past two weeks, have you felt little interest or pleasure in doing things? No  Have you lost interest or pleasure in daily life? No  Do you often feel hopeless? No  Do you cry easily over simple problems? No  Activities of Daily Living In your present state of health, do you have any difficulty performing the following activities?:  Driving?  Yes (some when he first had the stroke) Managing money?  No Feeding yourself? No Getting from bed to chair? No Climbing a flight of stairs? No Preparing food and eating?: No Bathing or showering? No Getting dressed: No Getting to the toilet? No Using the toilet:No Moving around from place to place: No In the past year have you fallen or had a near fall?:No Sexually active: yes, has girlfriend   Hearing Difficulties: No Do you often ask people to speak up or repeat themselves? No Do you experience ringing or noises in your ears? No Do you have difficulty understanding soft or whispered voices? No   Do you feel that you have a problem with memory? No  Do you often misplace items? No  Do you feel safe at home?  Yes  Cognitive Testing  Alert? Yes  Normal Appearance?Yes  Oriented to person? Yes  Place? Yes   Time? Yes  Recall of three objects?  Yes  Can perform simple calculations? Yes  Displays appropriate judgment?Yes  Can read the correct time from a watch face?Yes   Advanced Directives have been discussed with the patient? Yes   List the Names of Other Physician/Practitioners you currently use: 1.    Indicate any recent Medical Services you may have received from other  than [facility] providers in the past year (date may be approximate).  Immunization History  Administered Date(s) Administered  . Moderna SARS-COVID-2 Vaccination 12/14/2019, 01/11/2020  . Pneumococcal Conjugate-13 07/26/2019  . Tdap 01/18/2012    Screening Tests Health Maintenance  Topic Date Due  . INFLUENZA VACCINE  Never done  . COLONOSCOPY  07/25/2020 (Originally 07/01/2004)  . PNA vac Low Risk Adult (2 of 2 - PPSV23) 07/25/2020  . TETANUS/TDAP  01/17/2022  . COVID-19 Vaccine  Completed  . Hepatitis C Screening  Completed  . HIV Screening  Completed    All answers were reviewed with the patient and necessary referrals were made:  Ronnie Doss, DO   04/20/2020   History reviewed: allergies, current medications, past family history, past medical history, past social history, past surgical history and problem list  Review of Systems Pertinent items noted in HPI and remainder of comprehensive ROS otherwise negative.    Objective:     Vision by Snellen chart: not completed.  Patient with recent evaluation by neurology in the setting of CVA affecting left eye. Blood pressure (!) 111/54, pulse 98, temperature 98.3 F (36.8 C), height 5\' 8"  (1.727 m), weight 237 lb 3.2 oz (107.6 kg), SpO2 99 %. Body mass index is 36.07 kg/m.  BP (!) 111/54   Pulse 98   Temp 98.3 F (36.8 C)   Ht 5\' 8"  (1.727 m)   Wt 237 lb 3.2 oz (107.6 kg)   SpO2 99%   BMI 36.07 kg/m  General appearance: alert, cooperative, appears stated age, no distress and mildly obese Eyes: negative findings: lids and lashes normal, conjunctivae and sclerae normal and corneas clear Lungs: clear to auscultation bilaterally Heart: regular rate and rhythm, S1, S2 normal, no murmur, click, rub or gallop Psych: Mood stable, speech normal, affect appropriate, pleasant, interactive.  Good eye contact.  Does not appear to be responding to internal stimuli.  Depression screen [facility] 2/9 04/20/2020 04/07/2020 12/13/2019  Decreased  Interest 0 0 0  Down, Depressed, Hopeless 0 0 0  PHQ - 2 Score 0 0 0  Altered sleeping - - 2  Tired, decreased energy - - 1  Change in appetite - - 1  Feeling bad or failure about yourself  - - 0  Trouble concentrating - - 0  Moving slowly or fidgety/restless - - 0  Suicidal thoughts - - 0  PHQ-9 Score - - 4  Difficult doing work/chores - - Not difficult at all       Assessment:     1. Welcome to Medicare preventive visit  2. Benign prostatic hyperplasia with post-void dribbling - PSA  3. Encounter for counseling regarding advance directives Advance directive packet provided.  He will complete and return to the office for scanning      Plan:     During the course of the visit the patient was educated and counseled about appropriate screening and preventive services including:    Colorectal cancer screening: ROI completed for [facility]. Previous polyps on exam. Likely due.  Advanced directives: has NO advanced directive  - add't info requested. Referral to SW: no. this packet was provided  Diet review for nutrition referral? no   Patient Instructions (the written plan) was given to the patient.  Medicare Attestation I have personally reviewed: The patient's medical and social history Their use of alcohol, tobacco or illicit drugs Their current medications and supplements The patient's functional ability including ADLs,fall risks, home safety risks, cognitive, and hearing and visual impairment Diet and physical activities Evidence for depression or mood disorders  The patient's weight, height, BMI, and visual acuity have been recorded in the chart.  I have made referrals, counseling, and provided education to the patient based on review of the above and I have provided the patient with a written personalized care plan for preventive services.     Ronnie Doss, DO   04/20/2020

## 2020-04-21 ENCOUNTER — Encounter: Payer: Medicare Other | Admitting: Family Medicine

## 2020-04-21 LAB — PSA: Prostate Specific Ag, Serum: 0.8 ng/mL (ref 0.0–4.0)

## 2020-04-27 DIAGNOSIS — H349 Unspecified retinal vascular occlusion: Secondary | ICD-10-CM | POA: Diagnosis not present

## 2020-04-27 DIAGNOSIS — R0602 Shortness of breath: Secondary | ICD-10-CM | POA: Diagnosis not present

## 2020-05-25 ENCOUNTER — Ambulatory Visit: Payer: Medicare Other | Admitting: Cardiology

## 2020-05-25 ENCOUNTER — Encounter: Payer: Self-pay | Admitting: Cardiology

## 2020-05-25 ENCOUNTER — Other Ambulatory Visit: Payer: Self-pay

## 2020-05-25 VITALS — BP 123/58 | HR 66 | Resp 16 | Ht 68.0 in | Wt 236.0 lb

## 2020-05-25 DIAGNOSIS — H3412 Central retinal artery occlusion, left eye: Secondary | ICD-10-CM

## 2020-05-25 DIAGNOSIS — R002 Palpitations: Secondary | ICD-10-CM

## 2020-05-25 DIAGNOSIS — I491 Atrial premature depolarization: Secondary | ICD-10-CM | POA: Diagnosis not present

## 2020-05-25 DIAGNOSIS — I10 Essential (primary) hypertension: Secondary | ICD-10-CM | POA: Diagnosis not present

## 2020-05-25 DIAGNOSIS — G4733 Obstructive sleep apnea (adult) (pediatric): Secondary | ICD-10-CM

## 2020-05-25 NOTE — Progress Notes (Signed)
Primary Physician/Referring:  Janora Norlander, DO  Patient ID: Derek Ryan, male    DOB: 02-27-1956, 64 y.o.   MRN: [id number]  Chief Complaint  Patient presents with  . Results  . Follow-up   HPI:    Derek Ryan  is a 64 y.o.  Caucasian male with hypertension, hyperlipidemia, hyperglycemia, anxiety and depression, admitted to [facility], [place], [place] with bilateral covid 19 pneumonia on 05/24/2019 and discharged 6 days later, Had markedly abnormal echocardiogram revealing speckled pattern and severe LVH.  However I performed extensive evaluation including stress testing and MRI which is nonischemic and essentially showing mild to moderate LVH without infiltrate or heart disease. He does not smoke, quit smoking remotely and does not drink alcohol.  He was admitted to the hospital on 03/30/2020 and discharged on 04/02/2020 with left visual loss associated with headache and left facial paresthesia and diagnosed with central retinal artery occlusion and underwent extensive evaluation and was recommended outpatient event monitoring for follow-up.  He was also started on Plavix along with previous aspirin.  MRI was negative for stroke and carotid duplex did not reveal any significant carotid abnormality.  Felt his symptoms were related to cryptogenic etiology.  He underwent event monitor and presents for follow-up.  Past Medical History:  Diagnosis Date  . Central retinal artery occlusion of left eye   . Chronic diastolic heart failure ([place]) 08/25/2019  . Depression   . GERD (gastroesophageal reflux disease)   . Hyperlipidemia   . Hypertension   . 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 OSA (obstructive sleep apnea)    unable to tolerate CPAP   Social History   Tobacco Use  . Smoking status: Former Smoker    Packs/day: 1.00    Years: 23.00    Pack years: 23.00    Types: Cigarettes    Quit date: 1992    Years since quitting: 29.7  .  Smokeless tobacco: Never Used  Substance Use Topics  . Alcohol use: Not Currently    Comment: stopped in 1997   Marital Status: Widowed   ROS  Review of Systems  Constitutional: Negative for malaise/fatigue and weight gain.  Cardiovascular: Positive for dyspnea on exertion.  Respiratory: Positive for sleep disturbances due to breathing and snoring.   Musculoskeletal: Positive for muscle cramps (occasional leg cramps with activity).  All other systems reviewed and are negative.  Objective   Vitals with BMI 05/25/2020 04/20/2020 04/07/2020  Height 5\' 8"  5\' 8"  5\' 8"   Weight 236 lbs 237 lbs 3 oz 236 lbs 10 oz  BMI 35.89 36.64 40.34  Systolic 742 595 638  Diastolic 58 54 67  Pulse 66 98 81   Blood pressure (!) 123/58, pulse 66, resp. rate 16, height 5\' 8"  (1.727 m), weight 236 lb (107 kg), SpO2 95 %.   Physical Exam Constitutional:      Comments: Well-built, moderately obese, no acute distress.  Eyes:     Conjunctiva/sclera: Conjunctivae normal.  Neck:     Thyroid: No thyromegaly.     Comments: Short neck and difficult to evaluate JVP Cardiovascular:     Rate and Rhythm: Normal rate and regular rhythm.     Pulses:          Carotid pulses are 2+ on the right side and 2+ on the left side.      Dorsalis pedis pulses are 1+ on the right side and 1+ on the left side.  Posterior tibial pulses are 1+ on the right side and 1+ on the left side.     Heart sounds: Normal heart sounds. No murmur heard.  No gallop.      Comments: Femoral and popliteal pulse difficult to feel due to patient's body habitus.  Pulmonary:     Effort: Pulmonary effort is normal.     Breath sounds: Normal breath sounds.  Abdominal:     General: Bowel sounds are normal.     Palpations: Abdomen is soft.     Comments: Obese.t    Laboratory examination:   CMP Latest Ref Rng & Units 03/31/2020 03/30/2020 03/30/2020  Glucose 70 - 99 mg/dL 119(H) 94 174(H)  BUN 8 - 23 mg/dL 19 17 16   Creatinine 0.61 - 1.24  mg/dL 0.88 0.80 0.81  Sodium 135 - 145 mmol/L 138 140 139  Potassium 3.5 - 5.1 mmol/L 3.7 3.5 3.4(L)  Chloride 98 - 111 mmol/L 100 100 102  CO2 22 - 32 mmol/L 29 - 26  Calcium 8.9 - 10.3 mg/dL 9.3 - 9.2  Total Protein 6.5 - 8.1 g/dL 7.0 - 7.1  Total Bilirubin 0.3 - 1.2 mg/dL 1.0 - 1.2  Alkaline Phos 38 - 126 U/L 63 - 68  AST 15 - 41 U/L 22 - 23  ALT 0 - 44 U/L 24 - 23   CBC Latest Ref Rng & Units 03/31/2020 03/30/2020 03/30/2020  WBC 4.0 - 10.5 K/uL 7.9 - 6.8  Hemoglobin 13.0 - 17.0 g/dL 13.8 14.3 14.0  Hematocrit 39 - 52 % 41.1 42.0 42.1  Platelets 150 - 400 K/uL 247 - 247   Lipid Panel Recent Labs    04/01/20 0256  CHOL 120  TRIG 77  LDLCALC 68  VLDL 15  HDL 37*  CHOLHDL 3.2    HEMOGLOBIN A1C Lab Results  Component Value Date   HGBA1C 6.5 (H) 03/31/2020   MPG 139.85 03/31/2020   TSH Recent Labs    07/26/19 0843 03/31/20 1340  TSH 1.290 1.689    Medications and allergies   Allergies  Allergen Reactions  . Doxycycline Itching    As of 03/31/20, pt does not recall this reaction    Current Outpatient Medications  Medication Instructions  . albuterol (VENTOLIN HFA) 108 (90 Base) MCG/ACT inhaler TAKE 2 PUFFS BY MOUTH EVERY 6 HOURS AS NEEDED FOR WHEEZE OR SHORTNESS OF BREATH  . aspirin EC 81 mg, Oral, Daily  . atorvastatin (LIPITOR) 10 MG tablet TAKE 1 TABLET BY MOUTH EVERYDAY AT BEDTIME  . busPIRone (BUSPAR) 15 mg, Oral, 2 times daily, For anxiety  . cholecalciferol (VITAMIN D3) 2,000 Units, Oral, Daily  . escitalopram (LEXAPRO) 20 mg, Oral, Daily  . losartan-hydrochlorothiazide (HYZAAR) 50-12.5 MG tablet TAKE 1 TABLET BY MOUTH EVERY DAY  . Omega-3 Fatty Acids (FISH OIL) 1000 MG CAPS Oral  . vitamin C (ASCORBIC ACID) 500 mg, Oral, Daily    Radiology:   CTA chest 08/30/2019: 1. No CT findings for pulmonary embolism. 2. Atherosclerotic calcifications involving the thoracic aorta and coronary arteries but no aneurysm or dissection. 3. Two adjacent right lower  lobe pulmonary nodules are indeterminate but with history of smoking and emphysematous changes I would recommend a follow-up noncontrast chest CT in 6-12 months. There is also a borderline right infrahilar lymph node which can be re-examined at that time. 4. Calcified granulomas involving the chest and abdomen.  MR angio head without contrast 03/30/2020: 1. No acute intracranial abnormality. 2. Mild age-related cerebral atrophy with chronic small vessel  ischemic disease.  Cardiac Studies:   Lexiscan Tetrofosmin stress test 07/12/2019: No previous exam available for comparison. Myocardial perfusion imaging is normal. Left ventricular ejection fraction is 50% with normal wall motion. Low risk study.  Cardiac MR with and without contrast 07/06/2019: 1. Mildly dilated left ventricle with moderate concentric left ventricular hypertrophy and normal left ventricular systolic function (LVEF = 60%). There are no regional wall motion abnormalities. These findings are not consistent with an infiltrative cardiomyopathy. T1 mapping showed normal extracellular volume (ECV) of 22% (normal < 25%). 2.  Normal RV thickness and systolic function. 3.  Moderately dilated left and right atrial size. 4.  Normal aortic root, ascending aorta and pulmonary artery.   5.  Moderate mitral and tricuspid regurgitation. 6.  Normal pericardium.  Carotid artery duplex 03/31/2020: Right Carotid: Velocities in the right ICA are consistent with a 1-39% stenosis. Left Carotid: Velocities in the left ICA are consistent with a 1-39% stenosis. Vertebrals: Bilateral vertebral arteries demonstrate antegrade flow.  Echocardiogram 03/31/2020: 1. Left ventricular ejection fraction, by estimation, is 60 to 65%. The left ventricle has normal function. The left ventricle has no regional wall motion abnormalities. Left ventricular diastolic parameters are consistent with Grade I diastolic  dysfunction (impaired relaxation).  2. Right  ventricular systolic function is normal. The right ventricular size is normal.  3. The mitral valve is normal in structure. No evidence of mitral valve regurgitation. No evidence of mitral stenosis.  4. The aortic valve is normal in structure. Aortic valve regurgitation is not visualized. No aortic stenosis is present.  5. The inferior vena cava is normal in size with greater than 50% respiratory variability, suggesting right atrial pressure of 3 mmHg.  Zio Patch Extended out patient monitoring 14 days 04/07/2020 to 04/21/2020:  Predominant rhythm is sinus rhythm.  2 SVT runs occurred, fastest 4 beats at rate 185 and longest 6 beats at 138 bpm.  Occasional PVCs and PACs.  No patient activated events.  EKG:   EKG 02/23/2020: Normal sinus rhythm at rate of 60 bpm, normal axis, right bundle branch block.  No evidence of ischemia.  No significant change from 08/24/2018.  Assessment     ICD-10-CM   1. CRAO (central retinal artery occlusion), left 03/30/2020  H34.12   2. PAC (premature atrial contraction)  I49.1   3. Palpitations  R00.2   4. Essential hypertension  I10   5. OSA (obstructive sleep apnea) not on CPAP follows Dr. Rexene Alberts  G47.33     Recommendations:   Derek Ryan  is a 64 y.o. Caucasian male with hypertension, hyperlipidemia, hyperglycemia, anxiety and depression, admitted to [facility], [place], [place] with bilateral covid 19 pneumonia on 05/24/2019 and discharged 6 days later, Had markedly abnormal echocardiogram revealing speckled pattern and severe LVH.  However I performed extensive evaluation including stress testing and MRI which is nonischemic and essentially showing mild to moderate LVH without infiltrate or heart disease.  CTA chest was negative for PE but did show minor abnormality.  He was admitted to the hospital on 03/30/2020 and discharged on 04/02/2020 with left visual loss associated with headache and left facial paresthesia and diagnosed with central retinal artery  occlusion.  His vision has slightly improved.  He has completed 4-week course of Plavix and is presently on aspirin alone with no recurrence.  I reviewed his data from the hospitalization including MRI results, his labs, echocardiogram and carotid duplex.  Clearly a cryptogenic source for central retinal artery occlusion.  In view of  well-controlled hypertension, hyperlipidemia, prediabetes mellitus, atrial fibrillation is still a possibility.  Event monitor reviewed.  I suggest that we proceed with loop recorder implantation.  I would like to get opinion from Dr. Star Age who is seeing him soon to discuss regarding obstructive sleep apnea.  Patient has not been able to tolerate CPAP.  His atrial fibrillation risk factors include hypertension, obesity and obstructive sleep apnea.  I would like to see him back in 6 months however will discuss with Dr. Rexene Alberts whether proceeding with loop recorder implantation would be appropriate.  This was a 40-minute office visit encounter.   Adrian Prows, MD, [facility] 05/25/2020, 6:53 PM Office: [phone]   CC: Dr. Star Age (Neuro/Sleep Med)

## 2020-06-01 ENCOUNTER — Encounter: Payer: Self-pay | Admitting: Neurology

## 2020-06-01 ENCOUNTER — Ambulatory Visit: Payer: Medicare Other | Admitting: Neurology

## 2020-06-01 VITALS — BP 116/60 | HR 66 | Ht 68.0 in | Wt 235.0 lb

## 2020-06-01 DIAGNOSIS — H53452 Other localized visual field defect, left eye: Secondary | ICD-10-CM

## 2020-06-01 DIAGNOSIS — G4733 Obstructive sleep apnea (adult) (pediatric): Secondary | ICD-10-CM | POA: Diagnosis not present

## 2020-06-01 DIAGNOSIS — H3412 Central retinal artery occlusion, left eye: Secondary | ICD-10-CM | POA: Diagnosis not present

## 2020-06-01 DIAGNOSIS — Z789 Other specified health status: Secondary | ICD-10-CM

## 2020-06-01 NOTE — Patient Instructions (Addendum)
It was good to see you again today.  I am glad to hear that you are feeling better.  Please be careful with your driving even though your eye doctor has cleared you for driving.  You do have a visual field defect of the left eye.  Continue exercising regularly and take your medications as directed. As discussed, secondary prevention is key after a stroke. This means: taking care of blood sugar values or diabetes management (A1c goal of less than 7.0), good blood pressure (hypertension) control and optimizing cholesterol management (with LDL goal of less than 70), exercising daily or regularly within your own mobility limitations of course, and overall cardiovascular risk factor reduction, which includes screening for and treatment of obstructive sleep apnea (OSA) and weight management.   As discussed, please continue all your medications.  Please work on weight loss.  For your sleep apnea we will try you on a BiPAP machine if possible and if needed you may have to do another home sleep test.  I will make a referral to Dr. Einar Gip for consideration of a implantable monitor for your heart.  In addition, I will make a referral to ENT for you to discuss the possibility of using an implantable device for treating sleep apnea.  As discussed, obstructive sleep apnea is an independent risk factor for stroke.

## 2020-06-01 NOTE — Progress Notes (Signed)
Subjective:    Patient ID: Derek Ryan is a 64 y.o. male.  HPI     Interim history:   Derek Ryan is a 64 year old right-handed gentleman with an underlying medical history of hypertension, hyperlipidemia, CHF with chronic diastolic heart failure, left ventricular hypertrophy, reflux disease, depression and obesity, who Presents for follow-up consultation of his obstructive sleep apnea after interim testing but also after recent hospitalization for left central retinal artery artery occlusion.  The patient is accompanied by his wife today.  I first met him on 09/01/2019 at the request of his primary care physician, at which time he reported snoring and excessive daytime somnolence as well as witnessed apneas.  He was advised to proceed with sleep study testing.  He had a home sleep test on 09/15/2019 which indicated severe obstructive sleep apnea with an AHI of 47/h, O2 nadir of 60%. He was advised to return for a full night titration study.  He had this on 10/09/2019 with significant improvement of his sleep disordered breathing with CPAP of 16 cm.  He was advised to start home CPAP therapy and follow-up in sleep clinic.  Unfortunately, he did not have a follow-up appointment.  He was recently hospitalized in August for sudden onset of left-sided vision loss, associated with headache and left facial paresthesia, symptoms started on 03/29/2020.  He was diagnosed with acute left central retinal artery occlusion on 03/30/2020.  He was in the hospital from 03/30/2020 through 04/02/2020.  In the hospital, he was loaded with 300 mg of Plavix and placed on dual antiplatelet therapy for a total of 3 weeks and then was supposed to take baby aspirin alone thereafter.  He did not have any abnormalities noted on telemetry but was advised to seek a 30-day heart monitor as an outpatient.  He was also advised to follow-up with ophthalmology.  He had a head CT without contrast on 03/30/2020 and I reviewed the results:  IMPRESSION: Normal head CT.  He had a brain MRI without contrast and MRA head without contrast on 03/30/2020 and I reviewed the results: IMPRESSION: MRI HEAD IMPRESSION:   1. No acute intracranial abnormality. 2. Mild age-related cerebral atrophy with chronic small vessel ischemic disease.   MRA HEAD IMPRESSION:   Normal intracranial MRA. No large vessel occlusion, hemodynamically significant stenosis, or other acute vascular abnormality. No aneurysm.  He had an echocardiogram on 03/31/2020 which did not reveal any blood clots and EF of 60 to 65%. Carotid Doppler ultrasound from 03/31/2020 revealed no significant ICA stenoses.  He had a recent follow-up with his cardiologist on 05/25/2020 and I reviewed the note.  He had a 2-week event monitor from 04/07/2020 through 04/21/2020 which showed 2 events of SVT, occasional PVCs and PACs.  The possibility of an implantable loop monitor was discussed.  Today, 06/01/2020: He reports feeling stable.  Still has difficulty seeing out of his left eye on the top primarily.  Has been driving, reports that his eye doctor approved of this and he suggested he could go back to driving.  He is careful.  He reports that he tried CPAP therapy but could not tolerate it.  He reports that the mask and the pressure were bothersome.  He gave the machine back.  He talked with his cardiologist recently.  He and his wife had questions about the loop monitor that was discussed.  I answered all their questions to the best of my abilities as I also explained that I am not a cardiologist but  we can certainly consider loop monitor for longer term surveillance of his heart rhythm.  I explained the procedure to them as best as I could.  We talked about stroke secondary prevention.  Thankfully, he did not have any sudden onset of one-sided weakness or numbness or tingling or droopy face or slurring of speech.  He is working on weight loss.  He continues to take all his medications as  prescribed.  He does admit to waking up at night with a panic sensation and gasping for air and also with a headache at times.  I explained the connection between sleep apnea and stroke risk.  The patient's allergies, current medications, family history, past medical history, past social history, past surgical history and problem list were reviewed and updated as appropriate.   Previously:   09/01/19: (He) reports snoring and excessive daytime somnolence, As well as witnessed apneas as observed by his wife some years ago.  I reviewed your virtual visit note from 08/24/2019.  He has woken up with a sense of panic and with palpitations.  He is followed by Dr. Einar Gip in cardiology and saw him on 08/25/2019.  I reviewed the note.  His Epworth sleepiness score is 6 out of 24, fatigue severity score is 27 out of 63.  He was hospitalized with COVID-19 infection in October, he was on oxygen for a few days in the hospital.  He attributes some of his shortness of breath that he has now at times to still recuperating from his Covid infection.  He quit smoking in 1992, he quit drinking alcohol in 1997.  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  He has children from his first marriage, his second wife died at age 61 about 4 years ago.  He lives alone, has 1 dog in the household, does watch TV in the bedroom but puts it on a timer.  He has nocturia about 2 or 3 times per average night, and he has woken up with a headache on occasion.  He does not typically take any medicine for headaches.  He does not drink caffeine on a day-to-day basis, drinks 2 cups of decaf coffee in the morning typically. His bedtime is generally between 10 and 1030 and rise time around 5 when he has to get up for work or later when he does not work. He reports no family history of OSA but his brother-in-law has a CPAP machine so the patient is familiar with this.   His Past Medical History Is Significant For: Past Medical History:  Diagnosis  Date  . Central retinal artery occlusion of left eye   . Chronic diastolic heart failure ([place]) 08/25/2019  . Depression   . GERD (gastroesophageal reflux disease)   . Hyperlipidemia   . Hypertension   . 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 OSA (obstructive sleep apnea)    unable to tolerate CPAP    His Past Surgical History Is Significant For: No past surgical history on file.  His Family History Is Significant For: Family History  Problem Relation Age of Onset  . Heart attack Mother   . Alcohol abuse Father   . Stomach cancer Sister   . Hypertension Brother   . Brain cancer Brother       His Social History Is Significant For: Social History   Socioeconomic History  . Marital status: Widowed    Spouse name: Not on file  . Number of children: 3  .  Years of education: Not on file  . Highest education level: Not on file  Occupational History  . 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 Smoking status: Former Smoker    Packs/day: 1.00    Years: 23.00    Pack years: 23.00    Types: Cigarettes    Quit date: 1992    Years since quitting: 29.8  . Smokeless tobacco: Never Used  Vaping Use  . Vaping Use: Never used  Substance and Sexual Activity  . Alcohol use: Not Currently    Comment: stopped in 1997  . Drug use: Never  . Sexual activity: Not on file  Other Topics Concern  . Not on file  Social History Narrative  . Not on file   Social Determinants of Health   Financial Resource Strain:   . Difficulty of Paying Living Expenses: Not on file  Food Insecurity:   . Worried About Charity fundraiser in the Last Year: Not on file  . Ran Out of Food in the Last Year: Not on file  Transportation Needs:   . Lack of Transportation (Medical): Not on file  . Lack of Transportation (Non-Medical): Not on file  Physical Activity:   . Days of Exercise per Week: Not on file  . Minutes of Exercise per Session: Not on file   Stress:   . Feeling of Stress : Not on file  Social Connections:   . Frequency of Communication with Friends and Family: Not on file  . Frequency of Social Gatherings with Friends and Family: Not on file  . Attends Religious Services: Not on file  . Active Member of Clubs or Organizations: Not on file  . Attends Archivist Meetings: Not on file  . Marital Status: Not on file    His Allergies Are:  Allergies  Allergen Reactions  . Doxycycline Itching    As of 03/31/20, pt does not recall this reaction  :   His Current Medications Are:  Outpatient Encounter Medications as of 06/01/2020  Medication Sig  . aspirin EC 81 MG tablet Take 81 mg by mouth daily.  [name] atorvastatin (LIPITOR) 10 MG tablet TAKE 1 TABLET BY MOUTH EVERYDAY AT BEDTIME (Patient taking differently: Take 10 mg by mouth at bedtime. )  . busPIRone (BUSPAR) 10 MG tablet Take 1.5 tablets (15 mg total) by mouth 2 (two) times daily. For anxiety  . cholecalciferol (VITAMIN D3) 25 MCG (1000 UT) tablet Take 2,000 Units by mouth daily.  [name] escitalopram (LEXAPRO) 20 MG tablet Take 1 tablet (20 mg total) by mouth daily.  [name] losartan-hydrochlorothiazide (HYZAAR) 50-12.5 MG tablet TAKE 1 TABLET BY MOUTH EVERY DAY  . Omega-3 Fatty Acids (FISH OIL) 1000 MG CAPS Take by mouth.  . vitamin C (ASCORBIC ACID) 500 MG tablet Take 500 mg by mouth daily.  . [DISCONTINUED] albuterol (VENTOLIN HFA) 108 (90 Base) MCG/ACT inhaler TAKE 2 PUFFS BY MOUTH EVERY 6 HOURS AS NEEDED FOR WHEEZE OR SHORTNESS OF BREATH (Patient not taking: Reported on 05/25/2020)   No facility-administered encounter medications on file as of 06/01/2020.  :  Review of Systems:  Out of a complete 14 point review of systems, all are reviewed and negative with the exception of these symptoms as listed below: Review of Systems  Neurological:       Here to f/u left eye stroke one month ago.  Pt sts he started cpap but d/c due to intolerance. Pt reports he has returned  the cpap  to his DME ( [facility]).      Objective:  Neurological Exam  Physical Exam Physical Examination:   Vitals:   06/01/20 1254  BP: 116/60  Pulse: 66  SpO2: 94%    General Examination: The patient is a very pleasant 64 y.o. male in no acute distress. He appears well-developed and well-nourished and well groomed.   HEENT: Normocephalic, atraumatic, pupils are equal, round and reactive to light and accommodation. Funduscopic exam is difficult but does not show any obvious abnormalities.  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 Hearing is grossly intact. Face is symmetric with normal facial animation. Speech is clear with no dysarthria noted. There is no hypophonia. There is no lip, neck/head, jaw or voice tremor. Neck is supple with full range of passive and active motion. There are no carotid bruits on auscultation. Oropharynx exam reveals: moderate mouth dryness, adequate dental hygiene With dentures on top, edentulous on the bottom, he does not usually use his lower dentures he reports.  Moderate airway crowding noted.  Tongue protrudes centrally in palate elevates symmetrically.  Chest: Clear to auscultation without wheezing, rhonchi or crackles noted.  Heart: S1+S2+0, regular and normal without murmurs, rubs or gallops noted.   Abdomen: Soft, non-tender and non-distended with normal bowel sounds appreciated on auscultation.  Extremities: There is no pitting edema in the distal lower extremities bilaterally.   Skin: Warm and dry without trophic changes noted.   Musculoskeletal: exam reveals Mild hip discomfort bilaterally, left more than right.   Neurologically:  Mental status: The patient is awake, alert and oriented in all 4 spheres. His immediate and remote memory, attention, language skills and fund of knowledge are appropriate. There is no evidence of aphasia, agnosia, apraxia or anomia. Speech is clear with  normal prosody and enunciation. Thought process is linear. Mood is normal and affect is normal.  Cranial nerves II - XII are as described above under HEENT exam.  Motor exam: Normal bulk, strength and tone is noted. There is no tremor, Romberg is negative. Reflexes are 2+ throughout. Fine motor skills and coordination: grossly intact.  Cerebellar testing: No dysmetria or intention tremor. There is no truncal or gait ataxia.  Sensory exam: intact to light touch in the upper and lower extremities.  Gait, station and balance: He stands easily. No veering to one side is noted. No leaning to one side is noted. Posture is age-appropriate and stance is narrow based. Gait shows normal stride length and normal pace. No problems turning are noted.   Assessment and Plan:   In summary, Derek Ryan is a very pleasant 64 year old male with an underlying medical history of hypertension, hyperlipidemia, CHF with chronic diastolic heart failure, left ventricular hypertrophy, reflux disease, depression, obesity, and left central retinal artery occlusion, who presents for follow-up consultation after his recent hospitalization for stroke work-up.  He was diagnosed with severe obstructive sleep apnea in January but stopped using CPAP therapy and gave the machine back.  We talked about stroke risk factors.  I explained to him that obstructive sleep apnea in the moderate or severe degree is an independent risk factor for stroke, TIA and heart disease.  He had a 2-week extremity heart monitor and is advised to consider loop monitor.  He is advised that I would like to send him to cardiology for this.  He had a recent checkup with his cardiologist, Dr. Einar Gip and also received a note  from him.  We talked about management of sleep apnea.  Alternative treatments are going to be limited, dental treatment is probably not feasible.  I suggested we refer him to ENT for consideration of inspire.  He is interested in learning more and  consider inspire if he is a candidate.  Ultimately, he may need another sleep study.  If he does want to try positive airway pressure treatment, we can certainly pursue repeat testing and a proper titration.  For now, we will try to see if we can get him a BiPAP machine to see if he tolerates the pressure at a lower setting and with BiPAP modality rather than CPAP at 16.  He is advised to continue all his medications.  He is advised to stay well-hydrated and well rested.  We talked about the importance of weight loss and exercise.  Is advised to follow-up in about 3 months, sooner if needed.  We talked about his extensive stroke work-up in the hospital.  He does not need any new testing from my end of things.  I made a referral to cardiology and ENT today and I also prescribed a BiPAP machine.  I answered all the questions today and the patient and his wife are in agreement.  I spent 40 minutes in total face-to-face time and in reviewing records during pre-charting, more than 50% of which was spent in counseling and coordination of care, reviewing test results, reviewing medications and treatment regimen and/or in discussing or reviewing the diagnosis of CRAO, OSA, the prognosis and treatment options. Pertinent laboratory and imaging test results that were available during this visit with the patient were reviewed by me and considered in my medical decision making (see chart for details).

## 2020-06-15 ENCOUNTER — Telehealth: Payer: Self-pay

## 2020-06-15 ENCOUNTER — Ambulatory Visit: Payer: Medicare Other | Admitting: Internal Medicine

## 2020-06-15 NOTE — Progress Notes (Deleted)
Cardiology Office Note:    Date:  06/15/2020   ID:  Derek Ryan, DOB September 15, 1955, MRN [id number]  PCP:  Derek Norlander, DO  [place] Cardiologist:  No primary care provider on file.  [facility] Electrophysiologist:  None   CC: Consulted for the evaluation of *** at the behest of Ryan, Derek M, DO   History of Present Illness:    Derek Ryan is a 64 y.o. male with a hx of HTN, HLD, Morbid Obesity, OSA and unable to tolerate CPAP,  LVH and HFpEF (CMR evaluation for infiltrative disease negative for LGE or ECV elevation) who was evaluated for Central retinal artery occlusion of the left eye, with no evidence of AF (ZioPatch 04/21/20) planned for implantable loop recorder.  Past Medical History:  Diagnosis Date  . Central retinal artery occlusion of left eye   . Chronic diastolic heart failure ([place]) 08/25/2019  . Depression   . GERD (gastroesophageal reflux disease)   . Hyperlipidemia   . Hypertension   . 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 OSA (obstructive sleep apnea)    unable to tolerate CPAP    No past surgical history on file.  Current Medications: No outpatient medications have been marked as taking for the 06/15/20 encounter (Appointment) with Derek Lean, MD.     Allergies:   Doxycycline   Social History   Socioeconomic History  . Marital status: Widowed    Spouse name: Not on file  . Number of children: 3  . Years of education: Not on file  . Highest education level: Not on file  Occupational History  . 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 Smoking status: Former Smoker    Packs/day: 1.00    Years: 23.00    Pack years: 23.00    Types: Cigarettes    Quit date: 1992    Years since quitting: 29.8  . Smokeless tobacco: Never Used  Vaping Use  . Vaping Use: Never used  Substance and Sexual Activity  . Alcohol use: Not Currently    Comment: stopped in 1997  . Drug use:  Never  . Sexual activity: Not on file  Other Topics Concern  . Not on file  Social History Narrative  . Not on file   Social Determinants of Health   Financial Resource Strain:   . Difficulty of Paying Living Expenses: Not on file  Food Insecurity:   . Worried About Charity fundraiser in the Last Year: Not on file  . Ran Out of Food in the Last Year: Not on file  Transportation Needs:   . Lack of Transportation (Medical): Not on file  . Lack of Transportation (Non-Medical): Not on file  Physical Activity:   . Days of Exercise per Week: Not on file  . Minutes of Exercise per Session: Not on file  Stress:   . Feeling of Stress : Not on file  Social Connections:   . Frequency of Communication with Friends and Family: Not on file  . Frequency of Social Gatherings with Friends and Family: Not on file  . Attends Religious Services: Not on file  . Active Member of Clubs or Organizations: Not on file  . Attends Archivist Meetings: Not on file  . Marital Status: Not on file     Family History: The patient's ***family history includes Alcohol abuse in his father; Brain cancer in his brother; Heart attack in his mother;  Hypertension in his brother; Stomach cancer in his sister.  ROS:   Please see the history of present illness.    *** All other systems reviewed and are negative.  EKGs/Labs/Other Studies Reviewed:    The following studies were reviewed today: ***  EKG:  EKG is *** ordered today.  The ekg ordered today demonstrates ***  Recent Labs: 03/31/2020: ALT 24; BUN 19; Creatinine, Ser 0.88; Hemoglobin 13.8; Platelets 247; Potassium 3.7; Sodium 138; TSH 1.689  Recent Lipid Panel    Component Value Date/Time   CHOL 120 04/01/2020 0256   TRIG 77 04/01/2020 0256   HDL 37 (L) 04/01/2020 0256   CHOLHDL 3.2 04/01/2020 0256   VLDL 15 04/01/2020 0256   LDLCALC 68 04/01/2020 0256     Risk Assessment/Calculations:   {Does this patient have ATRIAL  FIBRILLATION?:[phone]}   Physical Exam:    VS:  There were no vitals taken for this visit.    Wt Readings from Last 3 Encounters:  06/01/20 235 lb (106.6 kg)  05/25/20 236 lb (107 kg)  04/20/20 237 lb 3.2 oz (107.6 kg)     GEN: *** Well nourished, well developed in no acute distress HEENT: Normal NECK: No JVD; No carotid bruits LYMPHATICS: No lymphadenopathy CARDIAC: ***RRR, no murmurs, rubs, gallops RESPIRATORY:  Clear to auscultation without rales, wheezing or rhonchi  ABDOMEN: Soft, non-tender, non-distended MUSCULOSKELETAL:  No edema; No deformity  SKIN: Warm and dry NEUROLOGIC:  Alert and oriented x 3 PSYCHIATRIC:  Normal affect   ASSESSMENT:    No diagnosis found. PLAN:    In order of problems listed above:  1. ***   Medication Adjustments/Labs and Tests Ordered: Current medicines are reviewed at length with the patient today.  Concerns regarding medicines are outlined above.  No orders of the defined types were placed in this encounter.  No orders of the defined types were placed in this encounter.   There are no Patient Instructions on file for this visit.   Signed, Derek Lean, MD  06/15/2020 1:05 PM    [facility]

## 2020-06-15 NOTE — Telephone Encounter (Signed)
Follow Up:     This pt is calling back. I told him his appointment was cx.

## 2020-06-15 NOTE — Telephone Encounter (Signed)
I called and spoke with patients significant other Vaughan Basta, she is aware that Dr. Gasper Sells does not handle sleep apnea, Vaughan Basta stated that they saw the Neurologist for sleep apnea and she thought patient was coming to discuss loop recorder. Renato Gails to have patient follow up with his cardiologist Dr. Einar Gip.

## 2020-06-19 ENCOUNTER — Telehealth: Payer: Self-pay

## 2020-06-19 DIAGNOSIS — G4733 Obstructive sleep apnea (adult) (pediatric): Secondary | ICD-10-CM

## 2020-06-19 NOTE — Telephone Encounter (Signed)
Received this notice from [facility]: "Hey Megan - we cannot start pt on BiPAP without a BiPAP titration.   Thank you,  Janett Billow "

## 2020-06-19 NOTE — Telephone Encounter (Signed)
There is documentation in my note from 06/01/2020 that he could not tolerate CPAP and that I would like for him to start empiric standard BiPAP therapy.  Is not not enough?  If he gave the machine back, does he have to have another diagnostic test such as a repeat home sleep test first?  Can you check with his DME company?

## 2020-06-19 NOTE — Telephone Encounter (Signed)
I have reached out to [facility] again regarding this.

## 2020-06-21 NOTE — Telephone Encounter (Signed)
Also from [place]: "He will have to do the Sleep Study now and then get an order. I cannot just give a trial Bilevel device without the Sleep Study and titration."

## 2020-06-21 NOTE — Telephone Encounter (Signed)
I spoke to Jeneen Rinks, Freight forwarder at [employer]. This is his response: "So, this pt. failed compliance on cpap in the past. They will have to have a new F2F and in lab study and titration showing failure on cpap."

## 2020-06-21 NOTE — Telephone Encounter (Signed)
Then my question again is: does he need another diagnostic test such as HST and a subsequent BiPAP titration or cannot just order a BiPAP titration?

## 2020-06-21 NOTE — Telephone Encounter (Signed)
This is [employer]'s response: "Since he met compliance w/ the CPAP prior to voluntarily returning it, we don't have proof of CPAP tried and failed. Pt has a Medicare policy that will require this documentation in order to cover the BiPAP."

## 2020-06-21 NOTE — Telephone Encounter (Signed)
I spoke with Dr. Rexene Alberts. She will order an HST if pt is agreeable and wishes to pursue PAP therapy.  I called pt to discuss. No answer, left a message asking him to call me back.

## 2020-06-26 NOTE — Telephone Encounter (Signed)
I called pt again to discuss. No answer, left a message asking him to call me back. 

## 2020-06-27 NOTE — Addendum Note (Signed)
Addended by: Lester [place] A on: 06/27/2020 01:30 PM   Modules accepted: Orders

## 2020-06-27 NOTE — Telephone Encounter (Signed)
I called pt. I advised him that before getting a bipap his insurance requires a baseline sleep study. Pt is agreeable to this and understand that our sleep lab will call him to schedule. I also advised him that he will likely need an in lab titration study for bipap after the HST. Pt verbalized understanding.

## 2020-06-28 ENCOUNTER — Telehealth: Payer: Self-pay

## 2020-06-28 DIAGNOSIS — I5032 Chronic diastolic (congestive) heart failure: Secondary | ICD-10-CM

## 2020-06-28 DIAGNOSIS — H3412 Central retinal artery occlusion, left eye: Secondary | ICD-10-CM

## 2020-06-28 DIAGNOSIS — Z789 Other specified health status: Secondary | ICD-10-CM

## 2020-06-28 DIAGNOSIS — G4733 Obstructive sleep apnea (adult) (pediatric): Secondary | ICD-10-CM

## 2020-06-28 NOTE — Telephone Encounter (Signed)
Split-night study ordered, thank you for talking to the patient and explaining this to him.

## 2020-06-28 NOTE — Telephone Encounter (Signed)
Called patient and explained since he turned in his cpap machine medicare will not cover a HST but will cover an in lab. I explained that we can bring him in for a night study and try to get the data we need to qualify for the new Bipap machine. It is a process but he gets  adequate sleep we can do all this in one night. He is willing to do this study. I will document for tech to do basleine and split at 15, then place on cpap and document intolerance, then switch to bipap. Need a split night order.

## 2020-07-11 ENCOUNTER — Telehealth: Payer: Self-pay | Admitting: Cardiology

## 2020-07-11 NOTE — Telephone Encounter (Signed)
I left a voicemail for Mr. Derek Ryan and also discussed with his significant other Ms. Derek Ryan, regarding loop recorder implantation.  I have discussed with her that although central retinal artery will be considered a stroke, data on atrial fibrillation causing CRA occlusion is lacking and we could certainly continue to watch and observe for now.  However patient does have risk factors for atrial fibrillation including obstructive sleep apnea, age and hypertension.  He would like to make another appointment to see me and to discuss procedure and risks and benefits and complications.  They will call and make an appointment.  Patient was evaluated by Dr. Nancie Neas for sleep apnea evaluation, she also appears to feel we should probably proceed with loop recorder implantation.   Adrian Prows, MD, [facility] 07/11/2020, 10:54 AM Office: [phone] Pager: [phone]

## 2020-07-21 ENCOUNTER — Other Ambulatory Visit: Payer: Self-pay

## 2020-07-21 ENCOUNTER — Ambulatory Visit (INDEPENDENT_AMBULATORY_CARE_PROVIDER_SITE_OTHER): Payer: Medicare Other | Admitting: Neurology

## 2020-07-21 DIAGNOSIS — I5032 Chronic diastolic (congestive) heart failure: Secondary | ICD-10-CM

## 2020-07-21 DIAGNOSIS — G4720 Circadian rhythm sleep disorder, unspecified type: Secondary | ICD-10-CM

## 2020-07-21 DIAGNOSIS — G4733 Obstructive sleep apnea (adult) (pediatric): Secondary | ICD-10-CM

## 2020-07-21 DIAGNOSIS — H3412 Central retinal artery occlusion, left eye: Secondary | ICD-10-CM

## 2020-07-21 DIAGNOSIS — G4752 REM sleep behavior disorder: Secondary | ICD-10-CM

## 2020-07-21 DIAGNOSIS — Z789 Other specified health status: Secondary | ICD-10-CM

## 2020-07-25 ENCOUNTER — Telehealth: Payer: Self-pay

## 2020-07-25 NOTE — Progress Notes (Signed)
Patient had re-eval of his OSA with a split night study. Had intolerance to CPAP and returned machine. He was last seen by me on 06/01/20, split night sleep study on 07/21/20. Please call and notify patient that the recent sleep study confirmed the diagnosis of severe OSA. He did reasonably well with BiPAP but did require a higher treatment pressure. He also had movements and at least one episode of yelling out from dream sleep. I will write for a BiPAP machine for home use. I placed the order in the chart.  Please advise patient that we need a follow up appointment with either myself or one of our nurse practitioners in about 10 weeks post set-up to check for how the patient is feeling and how well the patient is using the machine, etc. Please go ahead and schedule the appointment, while you have the patient on the phone and make sure patient understands the importance of keeping this window for the FU appointment, as it is often an insurance requirement. Failing to adhere to this may result in losing coverage for sleep apnea treatment, at which point most patients are left with a choice of returning the machine or paying out of pocket (and we want neither of this to happen!).  Please re-enforce the importance of compliance with treatment and the need for [place] to monitor compliance data - again an insurance requirement and usually a good feedback for the patient as far as how they are doing.  Also remind patient, that any PAP machine or mask issues should be first addressed with the DME company, who provided the machine/mask.

## 2020-07-25 NOTE — Telephone Encounter (Signed)
-----   Message from Star Age, MD sent at 07/25/2020  9:50 AM EST ----- Patient had re-eval of his OSA with a split night study. Had intolerance to CPAP and returned machine. He was last seen by me on 06/01/20, split night sleep study on 07/21/20. Please call and notify patient that the recent sleep study confirmed the diagnosis of severe OSA. He did reasonably well with BiPAP but did require a higher treatment pressure. He also had movements and at least one episode of yelling out from dream sleep. I will write for a BiPAP machine for home use. I placed the order in the chart.  Please advise patient that we need a follow up appointment with either myself or one of our nurse practitioners in about 10 weeks post set-up to check for how the patient is feeling and how well the patient is using the machine, etc. Please go ahead and schedule the appointment, while you have the patient on the phone and make sure patient understands the importance of keeping this window for the FU appointment, as it is often an insurance requirement. Failing to adhere to this may result in losing coverage for sleep apnea treatment, at which point most patients are left with a choice of returning the machine or paying out of pocket (and we want neither of this to happen!).  Please re-enforce the importance of compliance with treatment and the need for [place] to monitor compliance data - again an insurance requirement and usually a good feedback for the patient as far as how they are doing.  Also remind patient, that any PAP machine or mask issues should be first addressed with the DME company, who provided the machine/mask.

## 2020-07-25 NOTE — Telephone Encounter (Signed)
I called pt again to discuss. No answer, left a message asking him to call me back. 

## 2020-07-25 NOTE — Telephone Encounter (Signed)
Patient is returning your call. Can be reached back at [phone]

## 2020-07-25 NOTE — Procedures (Signed)
PATIENT'S NAME:  Derek Ryan, Ehrler DOB:      03-Aug-1956      MR#:    [id number]     DATE OF RECORDING: 07/21/2020 REFERRING M.D.:  Renato Gails. Lajuana Ripple, MD DO Study Performed:  Split-Night Titration Study HISTORY: 64 year old man with a history of hypertension, hyperlipidemia, CHF with chronic diastolic heart failure, left ventricular hypertrophy, reflux disease, depression and obesity, who presents for re-evaluation of his sleep apnea. He no longer has a CPAP machine and reported intolerance to CPAP. He was recently hospitalized for left central retinal artery occlusion. The patient endorsed the Epworth Sleepiness Scale at 6 points. The patient's weight 235 pounds with a height of 68 (inches), resulting in a BMI of 35.8 kg/m2.   CURRENT MEDICATIONS: Ventolin, Aspirin, Lipitor, Buspar, Vitamin D3, Lexapro, Hyzaar, fish oil, ascorbic acid, zinc sulfate, KlorCon   PROCEDURE:  This is a multichannel digital polysomnogram utilizing the Somnostar 11.2 system.  Electrodes and sensors were applied and monitored per AASM Specifications.   EEG, EOG, Chin and Limb EMG, were sampled at 200 Hz.  ECG, Snore and Nasal Pressure, Thermal Airflow, Respiratory Effort, CPAP Flow and Pressure, Oximetry was sampled at 50 Hz. Digital video and audio were recorded.      BASELINE STUDY WITHOUT CPAP RESULTS:  Lights Out was at 20:35 and Lights On at 03:14 for the night, split start at 22:50, epoch 276.  Total recording time (TRT) was 140.5, with a total sleep time (TST) of 128.5 minutes.   The patient's sleep latency was 13 minutes.  REM sleep was absent prior to PAP therapy. The sleep efficiency was 91.5 %.    SLEEP ARCHITECTURE: WASO (Wake after sleep onset) was 3 minutes, Stage N1 was 15.5 minutes, Stage N2 was 105.5 minutes, Stage N3 was 7.5 minutes and Stage R (REM sleep) was 0 minutes.  The percentages were Stage N1 12.1%, Stage N2 82.1%, both increased, Stage N3 5.8% and Stage R (REM sleep) was absent.   RESPIRATORY  ANALYSIS:  There were a total of 96 respiratory events:  25 obstructive apneas, 0 central apneas and 0 mixed apneas with a total of 25 apneas and an apnea index (AI) of 11.7. There were 71 hypopneas with a hypopnea index of 33.2. The patient also had 0 respiratory event related arousals (RERAs).      The total APNEA/HYPOPNEA INDEX (AHI) was 44.8 /hour and the total RESPIRATORY DISTURBANCE INDEX was 44.8 /hour.  0 events occurred in REM sleep and 142 events in NREM. The REM AHI was 0 /hour versus a non-REM AHI of 44.8 /hour. The patient spent 215 minutes sleep time in the supine position 144 minutes in non-supine. The supine AHI was 44.9 /hour versus a non-supine AHI of 0.0 /hour.  OXYGEN SATURATION & C02:  The wake baseline 02 saturation was 92%, with the lowest being 84%. Time spent below 89% saturation equaled 24 minutes.  PERIODIC LIMB MOVEMENTS: The patient had a total of 0 Periodic Limb Movements.  The Periodic Limb Movement (PLM) index was 0 /hour and the PLM Arousal index was 0 /hour.   Audio and video analysis showed restless sleep and movements and yelling out in REM sleep during the titration portion of the study. Snoring was eventually alleviated with BiPAP therapy. The EKG was in keeping with normal sinus rhythm (NSR)  TITRATION STUDY WITH CPAP RESULTS:   The patient was fitted with a medium Dreamwear full face mask. CPAP was initiated at 6 cm and titrated to 11 cm,  but the patient had difficulty tolerating CPAP.  He reported difficulty exhaling.  AHI on a pressure of 11 cm was 13.3/h.  He was therefore switched to BiPAP therapy at 12/8 centimeters and titrated to 15/10 centimeters.  However, during REM sleep he had a long apnea with severe desaturation which prompted further titration to a final pressure of 19/14 centimeters.  His final AHI was 0/h, O2 nadir 91% with supine non-REM sleep achieved on the final pressure.  He did have some evidence of dream enactment behavior and REM sleep,  yelling was muffled by the mask and caused leakage from the mask.  He did endorse waking up in a panic and dreaming but could not recall his dream.  Total recording time (TRT) was 259 minutes, with a total sleep time (TST) of 230.5 minutes. The patient's sleep latency was 14.5 minutes. REM latency was 65 minutes.  The sleep efficiency was 89. %.    SLEEP ARCHITECTURE: Wake after sleep was 27 minutes, Stage N1 41.5 minutes, Stage N2 132 minutes, Stage N3 24 minutes and Stage R (REM sleep) 33 minutes. The percentages were: Stage N1 18.%, which is increased, Stage N2 57.3%, Stage N3 10.4% and Stage R (REM sleep) 14.3%.   The arousals were noted as: 64 were spontaneous, 0 were associated with PLMs, 26 were associated with respiratory events.  RESPIRATORY ANALYSIS:  There were a total of 27 respiratory events: 2 obstructive apneas, 0 central apneas and 0 mixed apneas with a total of 2 apneas and an apnea index (AI) of .5. There were 25 hypopneas with a hypopnea index of 6.5 /hour. The patient also had 16 respiratory event related arousals (RERAs).      The total APNEA/HYPOPNEA INDEX  (AHI) was 7. /hour and the total RESPIRATORY DISTURBANCE INDEX was 11.2 /hour.  3 events occurred in REM sleep and 24 events in NREM. The REM AHI was 5.5 /hour versus a non-REM AHI of 7.3 /hour. REM sleep was achieved on a pressure of  cm/h2o (AHI was  .) The patient spent 38% of total sleep time in the supine position. The supine AHI was 9.0 /hour, versus a non-supine AHI of 5.8/hour.  OXYGEN SATURATION & C02:  The wake baseline 02 saturation was 93%, with the lowest being 51% (during REM sleep, pressure of 15/10 cm). Time spent below 89% saturation equaled 3 minutes.  PERIODIC LIMB MOVEMENTS:    The patient had a total of 0 Periodic Limb Movements. The Periodic Limb Movement (PLM) index was 0 /hour and the PLM Arousal index was 0 /hour.  Post-study, the patient indicated that sleep was better than usual.  POLYSOMNOGRAPHY  IMPRESSION :   1. Severe Obstructive Sleep Apnea (OSA)  2. CPAP intolerance 3. Dream enactment behavior 4. Dysfunctions associated with sleep stages or arousals from sleep  RECOMMENDATIONS:  1. This patient has severe obstructive sleep apnea with a baseline AHI of 44.8/hour, O2 nadir of 84%, but did not responded well enough on CPAP therapy. He also did not tolerate CPAP therapy. The titration portion of the study was difficult with severe desaturations noted in supine REM sleep, requiring a rather high BiPAP pressure for treatment. I will home BiPAP therapy at a pressure of 19/14 centimeters via medium full facemask. The patient should be reminded to be fully compliant with PAP therapy to improve sleep related symptoms and decrease long term cardiovascular risks. Please note that untreated obstructive sleep apnea may carry additional perioperative morbidity. Patients with significant obstructive sleep apnea should  receive perioperative PAP therapy and the surgeons and particularly the anesthesiologist should be informed of the diagnosis and the severity of the sleep disordered breathing. 2. The patient had evidence of movements and yelling during REM sleep.  Underlying REM behavior disorder cannot be excluded.  Clinical correlation is recommended.  3. This study shows sleep fragmentation and abnormal sleep stage percentages; these are nonspecific findings and per se do not signify an intrinsic sleep disorder or a cause for the patient's sleep-related symptoms. Causes include (but are not limited to) the first night effect of the sleep study, circadian rhythm disturbances, medication effect or an underlying mood disorder or medical problem. Some movements and mumbling in REM sleep were noted; clinical correlation is recommended.  4. The patient should be cautioned not to drive, work at heights, or operate dangerous or heavy equipment when tired or sleepy. Review and reiteration of good sleep hygiene  measures should be pursued with any patient. 5. The patient will be seen in follow-up in the sleep clinic at [facility] for discussion of the test results, symptom and treatment compliance review, further management strategies, etc. The referring provider will be notified of the test results.   I certify that I have reviewed the entire raw data recording prior to the issuance of this report in accordance with the Standards of Accreditation of the American Academy of Sleep Medicine (AASM)  Star Age, MD, PhD Diplomat, American Board of Neurology and Sleep Medicine (Neurology and Sleep Medicine)

## 2020-07-25 NOTE — Addendum Note (Signed)
Addended by: Star Age on: 07/25/2020 09:50 AM   Modules accepted: Orders

## 2020-07-25 NOTE — Telephone Encounter (Signed)
Pt returned my call. I advised pt that Dr. Rexene Alberts reviewed their sleep study results and found that pt did reasonably well with a bipap during his sleep study. Pt had one episode of yelling during his dream sleep. Dr. Rexene Alberts recommends that pt start a bipap at home. I reviewed PAP compliance expectations with the pt. Pt is agreeable to starting a BiPAP. I advised pt that an order will be sent to a DME, [facility], and [employer] will call the pt within about one week after they file with the pt's insurance. [facility] will show the pt how to use the machine, fit for masks, and troubleshoot the BiPAP if needed. A follow up appt was made for insurance purposes with Dr. Rexene Alberts on 11/13/20 at 2:00pm. Pt verbalized understanding to arrive 15 minutes early and bring their BiPAP. A letter with all of this information in it will be mailed to the pt as a reminder. I verified with the pt that the address we have on file is correct. Pt verbalized understanding of results. Pt had no questions at this time but was encouraged to call back if questions arise. I have sent the order to [employer] and have received confirmation that they have received the order.

## 2020-07-25 NOTE — Telephone Encounter (Signed)
I called pt to discuss. No answer, left a message asking him to call me back. 

## 2020-07-27 ENCOUNTER — Other Ambulatory Visit: Payer: Self-pay

## 2020-07-27 ENCOUNTER — Ambulatory Visit: Payer: Medicare Other | Admitting: Cardiology

## 2020-07-27 ENCOUNTER — Encounter: Payer: Self-pay | Admitting: Cardiology

## 2020-07-27 VITALS — BP 115/60 | HR 91 | Ht 68.0 in | Wt 249.0 lb

## 2020-07-27 DIAGNOSIS — G4733 Obstructive sleep apnea (adult) (pediatric): Secondary | ICD-10-CM | POA: Diagnosis not present

## 2020-07-27 DIAGNOSIS — I498 Other specified cardiac arrhythmias: Secondary | ICD-10-CM

## 2020-07-27 DIAGNOSIS — I491 Atrial premature depolarization: Secondary | ICD-10-CM

## 2020-07-27 DIAGNOSIS — R0609 Other forms of dyspnea: Secondary | ICD-10-CM | POA: Diagnosis not present

## 2020-07-27 DIAGNOSIS — H349 Unspecified retinal vascular occlusion: Secondary | ICD-10-CM | POA: Diagnosis not present

## 2020-07-27 DIAGNOSIS — I10 Essential (primary) hypertension: Secondary | ICD-10-CM | POA: Diagnosis not present

## 2020-07-27 DIAGNOSIS — R0600 Dyspnea, unspecified: Secondary | ICD-10-CM

## 2020-07-27 NOTE — Progress Notes (Signed)
Primary Physician/Referring:  Janora Norlander, DO  Patient ID: Derek Ryan, male    DOB: 1955-09-13, 64 y.o.   MRN: [id number]  Chief Complaint  Patient presents with  . Shortness of Breath   HPI:    Derek Ryan  is a 64 y.o.  Caucasian male with hypertension, hyperlipidemia, hyperglycemia, anxiety and depression, admitted to [facility], [place], [place] with bilateral covid 19 pneumonia on 05/24/2019 and discharged 6 days later, Had markedly abnormal echocardiogram revealing speckled pattern and severe LVH.  However I performed extensive evaluation including stress testing and MRI which is nonischemic and essentially showing mild to moderate LVH without infiltrate or heart disease. He does not smoke, quit smoking remotely and does not drink alcohol.  He was admitted to the hospital on 03/30/2020 and discharged on 04/02/2020 with left visual loss associated with headache and left facial paresthesia and diagnosed with central retinal artery occlusion and underwent extensive evaluation including outpatient extended telemetry.  No etiology was found.  He was also evaluated by neurology and it was agreed upon that he would benefit from loop recorder implantation.  He now presents to the office with his girlfriend to discuss further options.  It was felt his symptoms were related to cryptogenic etiology.   Past Medical History:  Diagnosis Date  . Central retinal artery occlusion of left eye   . Chronic diastolic heart failure ([place]) 08/25/2019  . Depression   . GERD (gastroesophageal reflux disease)   . Hyperlipidemia   . Hypertension   . 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 OSA (obstructive sleep apnea)    unable to tolerate CPAP   Social History   Tobacco Use  . Smoking status: Former Smoker    Packs/day: 1.00    Years: 23.00    Pack years: 23.00    Types: Cigarettes    Quit date: 1992    Years since quitting: 29.9  . Smokeless  tobacco: Never Used  Substance Use Topics  . Alcohol use: Not Currently    Comment: stopped in 1997   Marital Status: Widowed   ROS  Review of Systems  Constitutional: Negative for malaise/fatigue and weight gain.  Cardiovascular: Positive for dyspnea on exertion.  Respiratory: Positive for sleep disturbances due to breathing and snoring.   Musculoskeletal: Positive for muscle cramps (occasional leg cramps with activity).  All other systems reviewed and are negative.  Objective   Vitals with BMI 07/27/2020 06/01/2020 05/25/2020  Height 5\' 8"  5\' 8"  5\' 8"   Weight 249 lbs 235 lbs 236 lbs  BMI 37.87 24.09 73.53  Systolic 299 242 683  Diastolic 60 60 58  Pulse 91 66 66   Blood pressure 115/60, pulse 91, height 5\' 8"  (1.727 m), weight 249 lb (112.9 kg), SpO2 96 %.   Physical Exam Constitutional:      Comments: Well-built, moderately obese, no acute distress.  Eyes:     Conjunctiva/sclera: Conjunctivae normal.  Neck:     Thyroid: No thyromegaly.     Comments: Short neck and difficult to evaluate JVP Cardiovascular:     Rate and Rhythm: Normal rate and regular rhythm.     Pulses:          Carotid pulses are 2+ on the right side and 2+ on the left side.      Dorsalis pedis pulses are 1+ on the right side and 1+ on the left side.       Posterior tibial pulses are  1+ on the right side and 1+ on the left side.     Heart sounds: Normal heart sounds. No murmur heard. No gallop.      Comments: Femoral and popliteal pulse difficult to feel due to patient's body habitus.  Pulmonary:     Effort: Pulmonary effort is normal.     Breath sounds: Normal breath sounds.  Abdominal:     General: Bowel sounds are normal.     Palpations: Abdomen is soft.     Comments: Obese.    Laboratory examination:   CMP Latest Ref Rng & Units 03/31/2020 03/30/2020 03/30/2020  Glucose 70 - 99 mg/dL 119(H) 94 174(H)  BUN 8 - 23 mg/dL 19 17 16   Creatinine 0.61 - 1.24 mg/dL 0.88 0.80 0.81  Sodium 135 - 145  mmol/L 138 140 139  Potassium 3.5 - 5.1 mmol/L 3.7 3.5 3.4(L)  Chloride 98 - 111 mmol/L 100 100 102  CO2 22 - 32 mmol/L 29 - 26  Calcium 8.9 - 10.3 mg/dL 9.3 - 9.2  Total Protein 6.5 - 8.1 g/dL 7.0 - 7.1  Total Bilirubin 0.3 - 1.2 mg/dL 1.0 - 1.2  Alkaline Phos 38 - 126 U/L 63 - 68  AST 15 - 41 U/L 22 - 23  ALT 0 - 44 U/L 24 - 23   CBC Latest Ref Rng & Units 03/31/2020 03/30/2020 03/30/2020  WBC 4.0 - 10.5 K/uL 7.9 - 6.8  Hemoglobin 13.0 - 17.0 g/dL 13.8 14.3 14.0  Hematocrit 39.0 - 52.0 % 41.1 42.0 42.1  Platelets 150 - 400 K/uL 247 - 247   Lipid Panel Recent Labs    04/01/20 0256  CHOL 120  TRIG 77  LDLCALC 68  VLDL 15  HDL 37*  CHOLHDL 3.2    HEMOGLOBIN A1C Lab Results  Component Value Date   HGBA1C 6.5 (H) 03/31/2020   MPG 139.85 03/31/2020   TSH Recent Labs    03/31/20 1340  TSH 1.689    Medications and allergies   Allergies  Allergen Reactions  . Doxycycline Itching    As of 03/31/20, pt does not recall this reaction    Current Outpatient Medications  Medication Instructions  . aspirin EC 81 mg, Oral, Daily  . atorvastatin (LIPITOR) 10 MG tablet TAKE 1 TABLET BY MOUTH EVERYDAY AT BEDTIME  . busPIRone (BUSPAR) 15 mg, Oral, 2 times daily, For anxiety  . cholecalciferol (VITAMIN D3) 2,000 Units, Oral, Daily  . escitalopram (LEXAPRO) 20 mg, Oral, Daily  . losartan-hydrochlorothiazide (HYZAAR) 50-12.5 MG tablet TAKE 1 TABLET BY MOUTH EVERY DAY  . Omega-3 Fatty Acids (FISH OIL) 1000 MG CAPS Oral  . vitamin C (ASCORBIC ACID) 500 mg, Oral, Daily    Radiology:   CTA chest 08/30/2019: 1. No CT findings for pulmonary embolism. 2. Atherosclerotic calcifications involving the thoracic aorta and coronary arteries but no aneurysm or dissection. 3. Two adjacent right lower lobe pulmonary nodules are indeterminate but with history of smoking and emphysematous changes I would recommend a follow-up noncontrast chest CT in 6-12 months. There is also a borderline right  infrahilar lymph node which can be re-examined at that time. 4. Calcified granulomas involving the chest and abdomen.  MR angio head without contrast 03/30/2020: 1. No acute intracranial abnormality. 2. Mild age-related cerebral atrophy with chronic small vessel ischemic disease.  Cardiac Studies:   Lexiscan Tetrofosmin stress test 07/12/2019: No previous exam available for comparison. Myocardial perfusion imaging is normal. Left ventricular ejection fraction is 50% with normal wall motion. Low  risk study.  Cardiac MR with and without contrast 07/06/2019: 1. Mildly dilated left ventricle with moderate concentric left ventricular hypertrophy and normal left ventricular systolic function (LVEF = 60%). There are no regional wall motion abnormalities. These findings are not consistent with an infiltrative cardiomyopathy. T1 mapping showed normal extracellular volume (ECV) of 22% (normal < 25%). 2.  Normal RV thickness and systolic function. 3.  Moderately dilated left and right atrial size. 4.  Normal aortic root, ascending aorta and pulmonary artery.   5.  Moderate mitral and tricuspid regurgitation. 6.  Normal pericardium.  Carotid artery duplex 03/31/2020: Right Carotid: Velocities in the right ICA are consistent with a 1-39% stenosis. Left Carotid: Velocities in the left ICA are consistent with a 1-39% stenosis. Vertebrals: Bilateral vertebral arteries demonstrate antegrade flow.  Echocardiogram 03/31/2020: 1. Left ventricular ejection fraction, by estimation, is 60 to 65%. The left ventricle has normal function. The left ventricle has no regional wall motion abnormalities. Left ventricular diastolic parameters are consistent with Grade I diastolic  dysfunction (impaired relaxation).  2. Right ventricular systolic function is normal. The right ventricular size is normal.  3. The mitral valve is normal in structure. No evidence of mitral valve regurgitation. No evidence of mitral  stenosis.  4. The aortic valve is normal in structure. Aortic valve regurgitation is not visualized. No aortic stenosis is present.  5. The inferior vena cava is normal in size with greater than 50% respiratory variability, suggesting right atrial pressure of 3 mmHg.  Zio Patch Extended out patient monitoring 14 days 04/07/2020 to 04/21/2020:  Predominant rhythm is sinus rhythm.  2 SVT runs occurred, fastest 4 beats at rate 185 and longest 6 beats at 138 bpm.  Occasional PVCs and PACs.  No patient activated events.  EKG:   EKG 02/23/2020: Normal sinus rhythm at rate of 60 bpm, normal axis, right bundle branch block.  No evidence of ischemia.  No significant change from 08/24/2018.  Assessment     ICD-10-CM   1. Retinal artery occlusion : left eye  H34.9   2. PAC (premature atrial contraction)  I49.1 EKG 12-Lead  3. Dyspnea on exertion  R06.00 EKG 12-Lead  4. Essential hypertension  I10   5. OSA (obstructive sleep apnea) not on CPAP follows Dr. Rexene Alberts  G47.33   6. Other cardiac arrhythmia  I49.8     Recommendations:   STAFFORD RIVIERA  is a 64 y.o. Caucasian male with hypertension, hyperlipidemia, hyperglycemia, anxiety and depression, admitted to [facility], [place], [place] with bilateral covid 19 pneumonia on 05/24/2019 and discharged 6 days later, Had markedly abnormal echocardiogram revealing speckled pattern and severe LVH.  However I performed extensive evaluation including stress testing and MRI which is nonischemic and essentially showing mild to moderate LVH without infiltrate or heart disease. He does not smoke, quit smoking remotely and does not drink alcohol.  He was admitted to the hospital on 03/30/2020 and discharged on 04/02/2020 with left visual loss associated with headache and left facial paresthesia and diagnosed with central retinal artery occlusion and underwent extensive evaluation including outpatient extended telemetry.  No etiology was found.  He was also evaluated by  neurology and it was agreed upon that he would benefit from loop recorder implantation.  In view of hypertension, hyperlipidemia, prediabetes mellitus, obstructive sleep apnea, age, mild obesity, atrial fibrillation is still a possibility.  I also discussed with him that central retinal artery occlusion as a manifestation of atrial fibrillation is rare but still possible.  In  view of this, patient would like to proceed with loop recorder implantation, I have discussed the risk of loop implantation including wound infection, potential trauma to the rib cage, hematoma and rarely pneumothorax.  They are in agreement with proceeding with the loop implantation.  With regard to sleep apnea, he is now more motivated to using CPAP and is awaiting fitting his mask.  Otherwise dyspnea is remained stable, blood pressures well controlled, follow-up after loop implantation.   Adrian Prows, MD, [facility] 07/30/2020, 1:01 PM Office: [phone]   CC: Dr. Star Age (Neuro/Sleep Med)

## 2020-07-30 DIAGNOSIS — I491 Atrial premature depolarization: Secondary | ICD-10-CM | POA: Insufficient documentation

## 2020-08-04 DIAGNOSIS — G4733 Obstructive sleep apnea (adult) (pediatric): Secondary | ICD-10-CM | POA: Diagnosis not present

## 2020-08-23 ENCOUNTER — Telehealth: Payer: Self-pay | Admitting: *Deleted

## 2020-08-23 MED ORDER — ALBUTEROL SULFATE HFA 108 (90 BASE) MCG/ACT IN AERS
2.0000 | INHALATION_SPRAY | Freq: Four times a day (QID) | RESPIRATORY_TRACT | 0 refills | Status: DC | PRN
Start: 2020-08-23 — End: 2020-09-18

## 2020-08-23 NOTE — Telephone Encounter (Signed)
Fax from [facility] Request Albuterol HFA (Ventolin INH 2 puffs Q 6 hr prn wheezing / SOB 18g Not on current med list Last OV 04/20/20 Next OV 10/18/20

## 2020-08-25 DIAGNOSIS — G4733 Obstructive sleep apnea (adult) (pediatric): Secondary | ICD-10-CM | POA: Diagnosis not present

## 2020-09-04 DIAGNOSIS — G4733 Obstructive sleep apnea (adult) (pediatric): Secondary | ICD-10-CM | POA: Diagnosis not present

## 2020-09-05 ENCOUNTER — Ambulatory Visit: Payer: Medicare Other | Admitting: Neurology

## 2020-09-18 ENCOUNTER — Ambulatory Visit: Payer: Medicare Other | Admitting: Neurology

## 2020-09-18 ENCOUNTER — Other Ambulatory Visit: Payer: Self-pay | Admitting: Family Medicine

## 2020-10-05 DIAGNOSIS — G4733 Obstructive sleep apnea (adult) (pediatric): Secondary | ICD-10-CM | POA: Diagnosis not present

## 2020-10-18 ENCOUNTER — Ambulatory Visit (INDEPENDENT_AMBULATORY_CARE_PROVIDER_SITE_OTHER): Payer: Medicare Other | Admitting: Family Medicine

## 2020-10-18 ENCOUNTER — Other Ambulatory Visit: Payer: Self-pay

## 2020-10-18 ENCOUNTER — Encounter: Payer: Self-pay | Admitting: Family Medicine

## 2020-10-18 VITALS — BP 130/67 | HR 72 | Temp 98.1°F | Ht 68.0 in | Wt 237.0 lb

## 2020-10-18 DIAGNOSIS — I10 Essential (primary) hypertension: Secondary | ICD-10-CM | POA: Diagnosis not present

## 2020-10-18 DIAGNOSIS — E785 Hyperlipidemia, unspecified: Secondary | ICD-10-CM | POA: Diagnosis not present

## 2020-10-18 DIAGNOSIS — R7303 Prediabetes: Secondary | ICD-10-CM

## 2020-10-18 DIAGNOSIS — F411 Generalized anxiety disorder: Secondary | ICD-10-CM | POA: Diagnosis not present

## 2020-10-18 DIAGNOSIS — R7309 Other abnormal glucose: Secondary | ICD-10-CM | POA: Diagnosis not present

## 2020-10-18 LAB — BAYER DCA HB A1C WAIVED: HB A1C (BAYER DCA - WAIVED): 5.8 % (ref <7.0)

## 2020-10-18 NOTE — Patient Instructions (Signed)
Prediabetes Eating Plan Prediabetes is a condition that causes blood sugar (glucose) levels to be higher than normal. This increases the risk for developing type 2 diabetes (type 2 diabetes mellitus). Working with a health care provider or nutrition specialist (dietitian) to make diet and lifestyle changes can help prevent the onset of diabetes. These changes may help you:  Control your blood glucose levels.  Improve your cholesterol levels.  Manage your blood pressure. What are tips for following this plan? Reading food labels  Read food labels to check the amount of fat, salt (sodium), and sugar in prepackaged foods. Avoid foods that have: ? Saturated fats. ? Trans fats. ? Added sugars.  Avoid foods that have more than 300 milligrams (mg) of sodium per serving. Limit your sodium intake to less than 2,300 mg each day. Shopping  Avoid buying pre-made and processed foods.  Avoid buying drinks with added sugar. Cooking  Cook with olive oil. Do not use butter, lard, or ghee.  Bake, broil, grill, steam, or boil foods. Avoid frying. Meal planning  Work with your dietitian to create an eating plan that is right for you. This may include tracking how many calories you take in each day. Use a food diary, notebook, or mobile application to track what you eat at each meal.  Consider following a Mediterranean diet. This includes: ? Eating several servings of fresh fruits and vegetables each day. ? Eating fish at least twice a week. ? Eating one serving each day of whole grains, beans, nuts, and seeds. ? Using olive oil instead of other fats. ? Limiting alcohol. ? Limiting red meat. ? Using nonfat or low-fat dairy products.  Consider following a plant-based diet. This includes dietary choices that focus on eating mostly vegetables and fruit, grains, beans, nuts, and seeds.  If you have high blood pressure, you may need to limit your sodium intake or follow a diet such as the DASH  (Dietary Approaches to Stop Hypertension) eating plan. The DASH diet aims to lower high blood pressure.   Lifestyle  Set weight loss goals with help from your health care team. It is recommended that most people with prediabetes lose 7% of their body weight.  Exercise for at least 30 minutes 5 or more days a week.  Attend a support group or seek support from a mental health counselor.  Take over-the-counter and prescription medicines only as told by your health care provider. What foods are recommended? Fruits Berries. Bananas. Apples. Oranges. Grapes. Papaya. Mango. Pomegranate. Kiwi. Grapefruit. Cherries. Vegetables Lettuce. Spinach. Peas. Beets. Cauliflower. Cabbage. Broccoli. Carrots. Tomatoes. Squash. Eggplant. Herbs. Peppers. Onions. Cucumbers. Brussels sprouts. Grains Whole grains, such as whole-wheat or whole-grain breads, crackers, cereals, and pasta. Unsweetened oatmeal. Bulgur. Barley. Quinoa. Brown rice. Corn or whole-wheat flour tortillas or taco shells. Meats and other proteins Seafood. Poultry without skin. Lean cuts of pork and beef. Tofu. Eggs. Nuts. Beans. Dairy Low-fat or fat-free dairy products, such as yogurt, cottage cheese, and cheese. Beverages Water. Tea. Coffee. Sugar-free or diet soda. Seltzer water. Low-fat or nonfat milk. Milk alternatives, such as soy or almond milk. Fats and oils Olive oil. Canola oil. Sunflower oil. Grapeseed oil. Avocado. Walnuts. Sweets and desserts Sugar-free or low-fat pudding. Sugar-free or low-fat ice cream and other frozen treats. Seasonings and condiments Herbs. Sodium-free spices. Mustard. Relish. Low-salt, low-sugar ketchup. Low-salt, low-sugar barbecue sauce. Low-fat or fat-free mayonnaise. The items listed above may not be a complete list of recommended foods and beverages. Contact a dietitian for more   information. What foods are not recommended? Fruits Fruits canned with syrup. Vegetables Canned vegetables. Frozen  vegetables with butter or cream sauce. Grains Refined white flour and flour products, such as bread, pasta, snack foods, and cereals. Meats and other proteins Fatty cuts of meat. Poultry with skin. Breaded or fried meat. Processed meats. Dairy Full-fat yogurt, cheese, or milk. Beverages Sweetened drinks, such as iced tea and soda. Fats and oils Butter. Lard. Ghee. Sweets and desserts Baked goods, such as cake, cupcakes, pastries, cookies, and cheesecake. Seasonings and condiments Spice mixes with added salt. Ketchup. Barbecue sauce. Mayonnaise. The items listed above may not be a complete list of foods and beverages that are not recommended. Contact a dietitian for more information. Where to find more information  American Diabetes Association: www.diabetes.org Summary  You may need to make diet and lifestyle changes to help prevent the onset of diabetes. These changes can help you control blood sugar, improve cholesterol levels, and manage blood pressure.  Set weight loss goals with help from your health care team. It is recommended that most people with prediabetes lose 7% of their body weight.  Consider following a Mediterranean diet. This includes eating plenty of fresh fruits and vegetables, whole grains, beans, nuts, seeds, fish, and low-fat dairy, and using olive oil instead of other fats. This information is not intended to replace advice given to you by your health care provider. Make sure you discuss any questions you have with your health care provider. Document Revised: 11/04/2019 Document Reviewed: 11/04/2019 Elsevier Patient Education  2021 Elsevier Inc.  

## 2020-10-18 NOTE — Progress Notes (Signed)
Subjective: CC: 8-month checkup PCP: Derek Norlander, DO [facility]:NOBSJ [name] is a 65 y.o. male presenting to clinic today for:  1.  Elevated sugar Patient does admit that he indulges in candies sometimes.  He tries to stay as active as possible and walks quite a bit at the [place].  He works part-time there 2 to 3 days/week.  2.  Hypertension with hyperlipidemia Patient compliant with all medicines.  No refills needed at this time.  No reports of chest pain, shortness of breath.  He knows he needs to lose weight and plans on trying to get more exercise into help with this.  He hydrates really well.  He does admit to about 4-5 episodes of nocturia as a result per day  3.  Anxiety Patient reports good control of symptoms with current medications.  No refills needed   ROS: Per HPI  Allergies  Allergen Reactions  . Doxycycline Itching    As of 03/31/20, pt does not recall this reaction   Past Medical History:  Diagnosis Date  . Central retinal artery occlusion of left eye   . Chronic diastolic heart failure ([place]) 08/25/2019  . Depression   . GERD (gastroesophageal reflux disease)   . Hyperlipidemia   . Hypertension   . 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 OSA (obstructive sleep apnea)    unable to tolerate CPAP    Current Outpatient Medications:  .  albuterol (VENTOLIN HFA) 108 (90 Base) MCG/ACT inhaler, INHALE 2 PUFFS INTO THE LUNGS EVERY 6 HOURS AS NEEDED FOR WHEEZE OR SHORTNESS OF BREATH, Disp: 8.5 each, Rfl: 0 .  aspirin EC 81 MG tablet, Take 81 mg by mouth daily., Disp: , Rfl:  .  atorvastatin (LIPITOR) 10 MG tablet, TAKE 1 TABLET BY MOUTH EVERYDAY AT BEDTIME (Patient taking differently: Take 10 mg by mouth at bedtime.), Disp: 90 tablet, Rfl: 3 .  busPIRone (BUSPAR) 10 MG tablet, Take 1.5 tablets (15 mg total) by mouth 2 (two) times daily. For anxiety, Disp: 270 tablet, Rfl: 3 .  cholecalciferol (VITAMIN D3) 25 MCG (1000 UT) tablet,  Take 2,000 Units by mouth daily., Disp: , Rfl:  .  escitalopram (LEXAPRO) 20 MG tablet, Take 1 tablet (20 mg total) by mouth daily., Disp: 90 tablet, Rfl: 3 .  losartan-hydrochlorothiazide (HYZAAR) 50-12.5 MG tablet, TAKE 1 TABLET BY MOUTH EVERY DAY, Disp: 90 tablet, Rfl: 2 .  Omega-3 Fatty Acids (FISH OIL) 1000 MG CAPS, Take by mouth., Disp: , Rfl:  .  vitamin C (ASCORBIC ACID) 500 MG tablet, Take 500 mg by mouth daily., Disp: , Rfl:  Social History   Socioeconomic History  . Marital status: Widowed    Spouse name: Not on file  . Number of children: 3  . Years of education: Not on file  . Highest education level: Not on file  Occupational History  . 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 Smoking status: Former Smoker    Packs/day: 1.00    Years: 23.00    Pack years: 23.00    Types: Cigarettes    Quit date: 1992    Years since quitting: 30.1  . Smokeless tobacco: Never Used  Vaping Use  . Vaping Use: Never used  Substance and Sexual Activity  . Alcohol use: Not Currently    Comment: stopped in 1997  . Drug use: Never  . Sexual activity: Not on file  Other Topics Concern  . Not on file  Social History Narrative  . Not on file   Social Determinants of Health   Financial Resource Strain: Not on file  Food Insecurity: Not on file  Transportation Needs: Not on file  Physical Activity: Not on file  Stress: Not on file  Social Connections: Not on file  Intimate Partner Violence: Not on file   Family History  Problem Relation Age of Onset  . Heart attack Mother   . Alcohol abuse Father   . Stomach cancer Sister   . Hypertension Brother   . Brain cancer Brother     Objective: Office vital signs reviewed. BP 130/67   Pulse 72   Temp 98.1 F (36.7 C) (Temporal)   Ht 5\' 8"  (1.727 m)   Wt 237 lb (107.5 kg)   SpO2 95%   BMI 36.04 kg/m   Physical Examination:  General: Awake, alert, well nourished, No acute distress HEENT: Normal, sclera white,  MMM Cardio: regular rate and rhythm, S1S2 heard, no murmurs appreciated Pulm: clear to auscultation bilaterally, no wheezes, rhonchi or rales; normal work of breathing on room air Extremities: warm, well perfused, No edema, cyanosis or clubbing; +2 pulses bilaterally MSK: Ambulating independently Psych: Mood stable, speech normal, affect appropriate.  Patient is pleasant and interactive.  Depression screen [facility] 2/9 10/18/2020 04/20/2020 04/07/2020  Decreased Interest 0 0 0  Down, Depressed, Hopeless 0 0 0  PHQ - 2 Score 0 0 0  Altered sleeping 2 - -  Tired, decreased energy 0 - -  Change in appetite 0 - -  Feeling bad or failure about yourself  0 - -  Trouble concentrating 0 - -  Moving slowly or fidgety/restless 0 - -  Suicidal thoughts 0 - -  PHQ-9 Score 2 - -  Difficult doing work/chores - - -   GAD 7 : Generalized Anxiety Score 10/18/2020 12/13/2019 07/26/2019  Nervous, Anxious, on Edge 1 2 2   Control/stop worrying 0 2 1  Worry too much - different things 0 2 1  Trouble relaxing 2 2 1   Restless 0 2 1  Easily annoyed or irritable 0 1 0  Afraid - awful might happen 0 1 0  Total GAD 7 Score 3 12 6   Anxiety Difficulty - Not difficult at all Not difficult at all     Assessment/ Plan: 65 y.o. male   Essential hypertension  Dyslipidemia  Anxiety, generalized  Pre-diabetes - Plan: Bayer DCA Hb A1c Waived  Blood pressure is well controlled.  Continue current regimen  Continue current cholesterol medication regimen.  Not due for fasting labs until next visit.  Anxiety is well controlled.  Continue current regimen  A1c noted to be elevated last visit.  Recheck today shows prediabetes at 5.8.  Discussed increasing physical activity and reducing carbohydrate intake.  Orders Placed This Encounter  Procedures  . Bayer DCA Hb A1c Waived   No orders of the defined types were placed in this encounter.    Derek Norlander, DO [place] [phone]

## 2020-10-26 ENCOUNTER — Other Ambulatory Visit: Payer: Self-pay

## 2020-10-26 ENCOUNTER — Encounter: Payer: Self-pay | Admitting: Family Medicine

## 2020-10-26 ENCOUNTER — Ambulatory Visit (INDEPENDENT_AMBULATORY_CARE_PROVIDER_SITE_OTHER): Payer: Medicare Other | Admitting: Family Medicine

## 2020-10-26 VITALS — BP 137/75 | HR 63 | Temp 97.3°F | Ht 68.0 in | Wt 236.2 lb

## 2020-10-26 DIAGNOSIS — R079 Chest pain, unspecified: Secondary | ICD-10-CM

## 2020-10-26 MED ORDER — NITROGLYCERIN 0.4 MG SL SUBL: 0.4000 mg | SUBLINGUAL_TABLET | SUBLINGUAL | 3 refills | Status: DC | PRN

## 2020-10-26 NOTE — Telephone Encounter (Signed)
From patient.

## 2020-10-26 NOTE — Progress Notes (Signed)
Acute Office Visit  Subjective:    Patient ID: Derek Ryan, male    DOB: [birth year], 65 y.o.   MRN: [id number]  Chief Complaint  Patient presents with   Chest Pain    HPI Patient is in today for chest pain yesterday. Derek Ryan reports that he felt a sudden headache, with shortness of breath, and a pressure feeling in the right side of his chest for about 15 minutes yesterday. He reports that he also felt the discomfort in his chest again yesterday afternoon for about 15 minutes again. He denies numbness or tingling in his left arm, nausea, focal weakness, dizziness, palpitation, edema, or diaphoresis. Today he feels fine and denies chest pain or shortness of breath today.  He does have a history of GERD but he does not take medication for this and tries to control his symptoms with his diet. Denies heartburn, abdominal pain, nausea, or vomiting. He does have history of anxiety and has had panic attacks but this felt different. He has had a stress test within the last year. He is followed by cardiology.   Past Medical History:  Diagnosis Date   Central retinal artery occlusion of left eye    Chronic diastolic heart failure ([place]) 08/25/2019   Depression    GERD (gastroesophageal reflux disease)    Hyperlipidemia    Hypertension    LVH (left ventricular hypertrophy) due to hypertensive disease 08/25/2019   OSA (obstructive sleep apnea)    unable to tolerate CPAP    No past surgical history on file.  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  Tobacco Use   Smoking status: Former Smoker     Packs/day: 1.00    Years: 23.00    Pack years: 23.00    Types: Cigarettes    Quit date: 1992    Years since quitting: 30.2   Smokeless tobacco: Never Used  Scientific laboratory technician Use: Never used  Substance and Sexual Activity   Alcohol use: Not Currently    Comment: stopped in 1997   Drug use: Never   Sexual activity: Not on file  Other Topics Concern   Not on file  Social History Narrative   Not on file   Social Determinants of Health   Financial Resource Strain: Not on file  Food Insecurity: Not on file  Transportation Needs: Not on file  Physical Activity: Not on file  Stress: Not on file  Social Connections: Not on file  Intimate Partner Violence: Not on file    Outpatient Medications Prior to Visit  Medication Sig Dispense Refill   aspirin EC 81 MG tablet Take 81 mg by mouth daily.     atorvastatin (LIPITOR) 10 MG tablet TAKE 1 TABLET BY MOUTH EVERYDAY AT BEDTIME (Patient taking differently: Take 10 mg by mouth at bedtime.) 90 tablet 3   busPIRone (BUSPAR) 10 MG tablet Take 1.5 tablets (15 mg total) by mouth 2 (two) times daily. For anxiety 270 tablet 3   cholecalciferol (VITAMIN D3)  25 MCG (1000 UT) tablet Take 2,000 Units by mouth daily.     escitalopram (LEXAPRO) 20 MG tablet Take 1 tablet (20 mg total) by mouth daily. 90 tablet 3   losartan-hydrochlorothiazide (HYZAAR) 50-12.5 MG tablet TAKE 1 TABLET BY MOUTH EVERY DAY 90 tablet 2   Omega-3 Fatty Acids (FISH OIL) 1000 MG CAPS Take by mouth.     vitamin C (ASCORBIC ACID) 500 MG tablet Take 500 mg by mouth daily.     albuterol (VENTOLIN HFA) 108 (90 Base) MCG/ACT inhaler INHALE 2 PUFFS INTO THE LUNGS EVERY 6 HOURS AS NEEDED FOR WHEEZE OR SHORTNESS OF BREATH (Patient not taking: Reported on 10/26/2020) 8.5 each 0   No facility-administered medications prior to visit.    Allergies  Allergen Reactions   Doxycycline Itching    As of 03/31/20, pt does not recall this reaction    Review of  Systems As per HPI.     Objective:    Physical Exam Vitals and nursing note reviewed.  Constitutional:      General: He is not in acute distress.    Appearance: He is well-developed. He is not ill-appearing, toxic-appearing or diaphoretic.  HENT:     Head: Normocephalic and atraumatic.  Eyes:     Extraocular Movements: Extraocular movements intact.     Pupils: Pupils are equal, round, and reactive to light.  Neck:     Vascular: No JVD.     Trachea: No tracheal deviation.  Cardiovascular:     Rate and Rhythm: Normal rate and regular rhythm.     Heart sounds: Normal heart sounds. No murmur heard. No friction rub. No gallop. No S3 or S4 sounds.   Pulmonary:     Effort: Pulmonary effort is normal.     Breath sounds: No decreased breath sounds, wheezing, rhonchi or rales.  Chest:     Chest wall: No mass, tenderness or edema.  Abdominal:     General: There is no abdominal bruit.     Palpations: Abdomen is soft. There is no mass.     Tenderness: There is no abdominal tenderness. There is no guarding.  Musculoskeletal:     Cervical back: Neck supple.     Right lower leg: No edema.     Left lower leg: No edema.  Skin:    General: Skin is warm and dry.  Neurological:     General: No focal deficit present.     Mental Status: He is alert and oriented to person, place, and time.  Psychiatric:        Mood and Affect: Mood normal.        Behavior: Behavior normal.     BP 137/75    Pulse 63    Temp (!) 97.3 F (36.3 C) (Temporal)    Ht '5\' 8"'  (1.727 m)    Wt 236 lb 4 oz (107.2 kg)    BMI 35.92 kg/m  Wt Readings from Last 3 Encounters:  10/26/20 236 lb 4 oz (107.2 kg)  10/18/20 237 lb (107.5 kg)  07/27/20 249 lb (112.9 kg)    Health Maintenance Due  Topic Date Due   COLONOSCOPY (Pts 45-41yr Insurance coverage will need to be confirmed)  05/06/2017   INFLUENZA VACCINE  Never done   COVID-19 Vaccine (3 - Booster for Moderna series) 07/13/2020   PNA vac Low Risk Adult (2  of 2 - PPSV23) 07/25/2020    There are no preventive care reminders to display for this patient.   Lab  Results  Component Value Date   TSH 1.689 03/31/2020   Lab Results  Component Value Date   WBC 7.9 03/31/2020   HGB 13.8 03/31/2020   HCT 41.1 03/31/2020   MCV 91.1 03/31/2020   PLT 247 03/31/2020   Lab Results  Component Value Date   NA 138 03/31/2020   K 3.7 03/31/2020   CO2 29 03/31/2020   GLUCOSE 119 (H) 03/31/2020   BUN 19 03/31/2020   CREATININE 0.88 03/31/2020   BILITOT 1.0 03/31/2020   ALKPHOS 63 03/31/2020   AST 22 03/31/2020   ALT 24 03/31/2020   PROT 7.0 03/31/2020   ALBUMIN 4.1 03/31/2020   CALCIUM 9.3 03/31/2020   ANIONGAP 9 03/31/2020   Lab Results  Component Value Date   CHOL 120 04/01/2020   Lab Results  Component Value Date   HDL 37 (L) 04/01/2020   Lab Results  Component Value Date   LDLCALC 68 04/01/2020   Lab Results  Component Value Date   TRIG 77 04/01/2020   Lab Results  Component Value Date   CHOLHDL 3.2 04/01/2020   Lab Results  Component Value Date   HGBA1C 5.8 10/18/2020       Assessment & Plan:   Derek Ryan was seen today for chest pain.  Diagnoses and all orders for this visit:  Chest pain, unspecified type EKG with sinus rhythm and right bundle branch block today- no significant change from previous EKGs. Labs pending as below. Exam unremarkable today. Nitroglycerin ordered and discussed with patient. Discussed when to seek emergency care. Patient will call his cardiologist today to schedule an appointment for further evaluation.   -     EKG 12-Lead -     nitroGLYCERIN (NITROSTAT) 0.4 MG SL tablet; Place 1 tablet (0.4 mg total) under the tongue every 5 (five) minutes as needed for chest pain. -     CBC with Differential/Platelet -     CMP14+EGFR  Return to office for new or worsening symptoms, or if symptoms persist.   The patient indicates understanding of these issues and agrees with the plan.  Gwenlyn Perking,  FNP

## 2020-10-26 NOTE — Patient Instructions (Signed)
Angina  Angina is discomfort in the chest, neck, arm, jaw, or back. The discomfort is caused by a lack of blood in the middle layer of the heart wall (myocardium). There are four types of angina:  Stable angina. This is triggered by vigorous activity or exercise. It goes away when you rest or take medicines that treat angina. This is diagnosed if you have had the symptom for more than 2 months.  Unstable angina. This is a warning sign and can lead to a heart attack. This is a medical emergency. Symptoms come at rest and last a long time.  Microvascular angina. This affects the small coronary arteries. Symptoms include chest pain, feeling tired, and being short of breath. The symptoms can last a long time or short time.  Prinzmetal or variant angina. This is caused by a spasm of the arteries that go to your heart. What are the causes? This condition is usually caused by atherosclerosis. This is the buildup of fat and cholesterol (plaque) in your arteries. The plaque may narrow or block the artery. Other causes of angina include:  Sudden spasms of the muscles of the arteries in the heart.  Small artery disease (microvascular dysfunction).  Problems with any of your heart valves.  A tear in an artery in your heart (coronary artery dissection).  Weakness of the heart muscle (cardiomyopathy). What increases the risk? You are more likely to develop this condition if you have:  High cholesterol.  High blood pressure.  Diabetes.  A family history of heart disease.  A sedentary lifestyle, or a lifestyle in which you do not exercise enough.  Depression.  Had radiation treatment to the left side of your chest. Other risk factors include:  Using tobacco.  Being obese.  Eating a diet high in saturated fats.  Being exposed to high stress or triggers of stress.  Using drugs, such as cocaine. Women have a greater risk for angina if they:  Are older than age 55.  Have gone  through menopause. What are the signs or symptoms? Common symptoms of this condition in both men and women may include:  Chest pain, which may: ? Feel like a crushing or squeezing in the chest, or a tightness, pressure, fullness, or heaviness in the chest. ? Last for more than a few minutes, or stop and come back over a few minutes.  Pain in the neck, arm, jaw, or back.  Unexplained heartburn or indigestion.  Shortness of breath.  Nausea.  Sudden cold sweats. Women and people with diabetes may have unusual (atypical) symptoms, such as:  Fatigue.  Unexplained feelings of nervousness or anxiety.  Unexplained weakness.  Dizziness or fainting. How is this diagnosed? This condition may be diagnosed based on:  Your symptoms and medical history.  Electrocardiogram (ECG) to measure the electrical activity in your heart.  Blood tests.  Stress test to look for signs of blockage when your heart is stressed.  CT angiogram to examine your heart and the blood flow to it.  Coronary angiogram to check for arterial blockage.  Echocardiogram (ultrasound) to assess the strength of your heartbeat. How is this treated? Angina may be treated with:  Medicines to: ? Prevent blood clots and heart attack. ? Relax blood vessels and improve blood flow to the heart. ? Reduce blood pressure, improve heart pumping, and relax blood vessels spasms. ? Reduce cholesterol and help treat atherosclerosis.  A procedure to widen a narrowed or blocked coronary artery (angioplasty). A mesh tube (stent) may   be placed in a coronary artery to keep it open.  Surgery to allow blood to go around a blocked artery (coronary artery bypass surgery). Follow these instructions at home: Medicines  Take over-the-counter and prescription medicines only as told by your health care provider.  Do not take the following medicines unless your health care provider approves: ? NSAIDs, such as ibuprofen or  naproxen. ? Vitamin supplements that contain vitamin A, vitamin E, or both. ? Hormone replacement therapy that contains estrogen with or without progestin. Eating and drinking  Eat a heart-healthy diet. This includes plenty of fresh fruits and vegetables, whole grains, low-fat (lean) protein, and low-fat dairy products.  Follow instructions from your health care provider about eating or drinking restrictions.   Activity  Follow an exercise program approved by your health care provider.  Consider joining a cardiac rehabilitation program.  Take a break when you feel fatigued. Plan rest periods in your daily activities. Lifestyle  Do not use any products that contain nicotine or tobacco. These products include cigarettes, chewing tobacco, and vaping devices, such as e-cigarettes. If you need help quitting, ask your health care provider.  If your health care provider says you can drink alcohol: ? Limit how much you have to:  0-1 drink a day for women who are not pregnant.  0-2 drinks a day for men. ? Be aware of how much alcohol is in your drink. In the [place] one drink equals one 12 oz bottle of beer (355 mL), one 5 oz glass of wine (148 mL), or one 1 oz glass of hard liquor (44 mL).   General instructions  Maintain a healthy weight.  Learn to manage stress.  Keep your vaccinations up to date. Get the flu (influenza) vaccine every year.  Talk to your health care provider if you feel depressed. Take a depression screening test to see if you are at risk for depression.  Work with your health care provider to manage other health conditions, such as hypertension or diabetes.  Keep all follow-up visits. This is important. Get help right away if:  You have pain in your chest, neck, arm, jaw, or back, and the pain: ? Lasts more than a few minutes. ? Is recurring. ? Is not relieved by taking medicines under the tongue (sublingual nitroglycerin). ? Increases in intensity or  frequency.  You have a lot of sweating without cause.  You have unexplained: ? Heartburn or indigestion. ? Shortness of breath or difficulty breathing. ? Nausea or vomiting. ? Fatigue. ? Feelings of nervousness or anxiety. ? Weakness.  You have sudden light-headedness or dizziness.  You faint. These symptoms may represent a serious problem that is an emergency. Do not wait to see if the symptoms will go away. Get medical help right away. Call your local emergency services (911 in the [place] Do not drive yourself to the hospital. Summary  Angina is discomfort in the chest, neck, arm, jaw, or back that is caused by a lack of blood in the arteries of the heart wall.  There are many symptoms of angina. They include chest pain, unexplained heartburn or indigestion, sudden cold sweats, and fatigue.  Angina may be treated with lifestyle changes, medicines, or surgery.  Symptoms of angina may represent an emergency. Get medical help right away. Call your local emergency services (911 in the [place] Do not drive yourself to the hospital. This information is not intended to replace advice given to you by your health care provider. Make sure you   discuss any questions you have with your health care provider. Document Revised: 01/28/2020 Document Reviewed: 01/28/2020 Elsevier Patient Education  2021 [employer]  

## 2020-10-27 LAB — CBC WITH DIFFERENTIAL/PLATELET
Basophils Absolute: 0.1 10*3/uL (ref 0.0–0.2)
Basos: 2 %
EOS (ABSOLUTE): 0.4 10*3/uL (ref 0.0–0.4)
Eos: 6 %
Hematocrit: 42 % (ref 37.5–51.0)
Hemoglobin: 14.3 g/dL (ref 13.0–17.7)
Immature Grans (Abs): 0 10*3/uL (ref 0.0–0.1)
Immature Granulocytes: 0 %
Lymphocytes Absolute: 2.3 10*3/uL (ref 0.7–3.1)
Lymphs: 32 %
MCH: 30.4 pg (ref 26.6–33.0)
MCHC: 34 g/dL (ref 31.5–35.7)
MCV: 89 fL (ref 79–97)
Monocytes Absolute: 0.7 10*3/uL (ref 0.1–0.9)
Monocytes: 10 %
Neutrophils Absolute: 3.5 10*3/uL (ref 1.4–7.0)
Neutrophils: 50 %
Platelets: 273 10*3/uL (ref 150–450)
RBC: 4.71 x10E6/uL (ref 4.14–5.80)
RDW: 12.2 % (ref 11.6–15.4)
WBC: 7 10*3/uL (ref 3.4–10.8)

## 2020-10-27 LAB — CMP14+EGFR
ALT: 22 IU/L (ref 0–44)
AST: 19 IU/L (ref 0–40)
Albumin/Globulin Ratio: 1.9 (ref 1.2–2.2)
Albumin: 4.7 g/dL (ref 3.8–4.8)
Alkaline Phosphatase: 91 IU/L (ref 44–121)
BUN/Creatinine Ratio: 18 (ref 10–24)
BUN: 15 mg/dL (ref 8–27)
Bilirubin Total: 0.8 mg/dL (ref 0.0–1.2)
CO2: 24 mmol/L (ref 20–29)
Calcium: 9.5 mg/dL (ref 8.6–10.2)
Chloride: 103 mmol/L (ref 96–106)
Creatinine, Ser: 0.85 mg/dL (ref 0.76–1.27)
Globulin, Total: 2.5 g/dL (ref 1.5–4.5)
Glucose: 120 mg/dL — ABNORMAL HIGH (ref 65–99)
Potassium: 4.5 mmol/L (ref 3.5–5.2)
Sodium: 146 mmol/L — ABNORMAL HIGH (ref 134–144)
Total Protein: 7.2 g/dL (ref 6.0–8.5)
eGFR: 96 mL/min/{1.73_m2} (ref >59)

## 2020-10-27 NOTE — Telephone Encounter (Signed)
done

## 2020-10-27 NOTE — Telephone Encounter (Signed)
Amber, he would like to see me sooner, can you make this happen please, next time you can take this decision by yourself and if patients want to be seen, you do not need my permission, just let me know. Either me or [facility]

## 2020-11-02 DIAGNOSIS — G4733 Obstructive sleep apnea (adult) (pediatric): Secondary | ICD-10-CM | POA: Diagnosis not present

## 2020-11-10 ENCOUNTER — Other Ambulatory Visit: Payer: Self-pay

## 2020-11-10 ENCOUNTER — Ambulatory Visit: Payer: Medicare Other | Admitting: Cardiology

## 2020-11-10 ENCOUNTER — Encounter: Payer: Self-pay | Admitting: Cardiology

## 2020-11-10 VITALS — BP 133/71 | HR 80 | Temp 97.8°F | Resp 17 | Ht 68.0 in | Wt 238.6 lb

## 2020-11-10 DIAGNOSIS — R0789 Other chest pain: Secondary | ICD-10-CM

## 2020-11-10 DIAGNOSIS — G4733 Obstructive sleep apnea (adult) (pediatric): Secondary | ICD-10-CM

## 2020-11-10 DIAGNOSIS — R0609 Other forms of dyspnea: Secondary | ICD-10-CM

## 2020-11-10 DIAGNOSIS — I10 Essential (primary) hypertension: Secondary | ICD-10-CM

## 2020-11-10 DIAGNOSIS — R0600 Dyspnea, unspecified: Secondary | ICD-10-CM

## 2020-11-10 NOTE — Progress Notes (Signed)
Primary Physician/Referring:  Janora Norlander, DO  Patient ID: Derek Ryan, male    DOB: June 14, 1956, 65 y.o.   MRN: [id number]  Chief Complaint  Patient presents with  . Chest Pain  . Follow-up   HPI:    Derek Ryan  is a 65 y.o.  Caucasian male with hypertension, hyperlipidemia, hyperglycemia, anxiety and depression, admitted to the hospital on 03/30/2020 and discharged on 04/02/2020 with left visual loss associated with headache and left facial paresthesia and diagnosed with central retinal artery occlusion and underwent extensive evaluation including outpatient extended telemetry.  No etiology was found.  He was also evaluated by neurology and it was agreed upon that he would benefit from loop recorder implantation but patient eventually decided for continued risk factor modification and observation for now.  He made an appointment on a urgent basis due to an episode of chest pain that occurred on 10/26/2020.  He has not had any recurrence since then.  He was evaluated by his PCP and had a normal EKG.  He is accompanied by his girlfriend, states that he has continued to remain active and has been walking daily and has not had any recurrence of chest pain.  Dyspnea on exertion has remained stable and improved.  No leg edema or symptoms or history of claudication or TIA.   Past Medical History:  Diagnosis Date  . Central retinal artery occlusion of left eye   . Chronic diastolic heart failure ([place]) 08/25/2019  . Depression   . GERD (gastroesophageal reflux disease)   . Hyperlipidemia   . Hypertension   . 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 OSA (obstructive sleep apnea)    unable to tolerate CPAP   Social History   Tobacco Use  . Smoking status: Former Smoker    Packs/day: 1.00    Years: 23.00    Pack years: 23.00    Types: Cigarettes    Quit date: 1992    Years since quitting: 30.2  . Smokeless tobacco: Never Used  Substance Use  Topics  . Alcohol use: Not Currently    Comment: stopped in 1997   Marital Status: Widowed   ROS  Review of Systems  Constitutional: Negative for malaise/fatigue and weight gain.  Cardiovascular: Positive for dyspnea on exertion.  Respiratory: Positive for sleep disturbances due to breathing and snoring.   All other systems reviewed and are negative.  Objective   Vitals with BMI 11/10/2020 10/26/2020 10/18/2020  Height 5\' 8"  5\' 8"  5\' 8"   Weight 238 lbs 10 oz 236 lbs 4 oz 237 lbs  BMI 36.29 62.94 76.54  Systolic 650 354 656  Diastolic 71 75 67  Pulse 80 63 72   Blood pressure 133/71, pulse 80, temperature 97.8 F (36.6 C), temperature source Temporal, resp. rate 17, height 5\' 8"  (1.727 m), weight 238 lb 9.6 oz (108.2 kg), SpO2 96 %.   Physical Exam Constitutional:      Comments: Well-built, moderately obese, no acute distress.  Eyes:     Conjunctiva/sclera: Conjunctivae normal.  Neck:     Thyroid: No thyromegaly.  Cardiovascular:     Rate and Rhythm: Normal rate and regular rhythm.     Pulses:          Carotid pulses are 2+ on the right side and 2+ on the left side.      Dorsalis pedis pulses are 1+ on the right side and 1+ on the left side.  Posterior tibial pulses are 1+ on the right side and 1+ on the left side.     Heart sounds: Normal heart sounds. No murmur heard. No gallop.   Pulmonary:     Effort: Pulmonary effort is normal.     Breath sounds: Normal breath sounds.  Abdominal:     General: Bowel sounds are normal.     Palpations: Abdomen is soft.     Comments: Obese.  Musculoskeletal:        General: No swelling.     Cervical back: Normal range of motion and neck supple.    Laboratory examination:   CMP Latest Ref Rng & Units 10/26/2020 03/31/2020 03/30/2020  Glucose 65 - 99 mg/dL 120(H) 119(H) 94  BUN 8 - 27 mg/dL 15 19 17   Creatinine 0.76 - 1.27 mg/dL 0.85 0.88 0.80  Sodium 134 - 144 mmol/L 146(H) 138 140  Potassium 3.5 - 5.2 mmol/L 4.5 3.7 3.5   Chloride 96 - 106 mmol/L 103 100 100  CO2 20 - 29 mmol/L 24 29 -  Calcium 8.6 - 10.2 mg/dL 9.5 9.3 -  Total Protein 6.0 - 8.5 g/dL 7.2 7.0 -  Total Bilirubin 0.0 - 1.2 mg/dL 0.8 1.0 -  Alkaline Phos 44 - 121 IU/L 91 63 -  AST 0 - 40 IU/L 19 22 -  ALT 0 - 44 IU/L 22 24 -   CBC Latest Ref Rng & Units 10/26/2020 03/31/2020 03/30/2020  WBC 3.4 - 10.8 x10E3/uL 7.0 7.9 -  Hemoglobin 13.0 - 17.7 g/dL 14.3 13.8 14.3  Hematocrit 37.5 - 51.0 % 42.0 41.1 42.0  Platelets 150 - 450 x10E3/uL 273 247 -   Lipid Panel Recent Labs    04/01/20 0256  CHOL 120  TRIG 77  LDLCALC 68  VLDL 15  HDL 37*  CHOLHDL 3.2    HEMOGLOBIN A1C Lab Results  Component Value Date   HGBA1C 5.8 10/18/2020   MPG 139.85 03/31/2020   TSH Recent Labs    03/31/20 1340  TSH 1.689    Medications and allergies   Allergies  Allergen Reactions  . Doxycycline Itching    As of 03/31/20, pt does not recall this reaction    Current Outpatient Medications on File Prior to Visit  Medication Sig Dispense Refill  . aspirin EC 81 MG tablet Take 81 mg by mouth daily.    [name] atorvastatin (LIPITOR) 10 MG tablet TAKE 1 TABLET BY MOUTH EVERYDAY AT BEDTIME (Patient taking differently: Take 10 mg by mouth at bedtime.) 90 tablet 3  . busPIRone (BUSPAR) 10 MG tablet Take 1.5 tablets (15 mg total) by mouth 2 (two) times daily. For anxiety 270 tablet 3  . cholecalciferol (VITAMIN D3) 25 MCG (1000 UT) tablet Take 2,000 Units by mouth daily.    [name] escitalopram (LEXAPRO) 20 MG tablet Take 1 tablet (20 mg total) by mouth daily. 90 tablet 3  . losartan-hydrochlorothiazide (HYZAAR) 50-12.5 MG tablet TAKE 1 TABLET BY MOUTH EVERY DAY 90 tablet 2  . nitroGLYCERIN (NITROSTAT) 0.4 MG SL tablet Place 1 tablet (0.4 mg total) under the tongue every 5 (five) minutes as needed for chest pain. 30 tablet 3  . Omega-3 Fatty Acids (FISH OIL) 1000 MG CAPS Take by mouth.    . vitamin C (ASCORBIC ACID) 500 MG tablet Take 500 mg by mouth daily.     No  current facility-administered medications on file prior to visit.     Radiology:   CTA chest 08/30/2019: 1. No CT findings for  pulmonary embolism. 2. Atherosclerotic calcifications involving the thoracic aorta and coronary arteries but no aneurysm or dissection. 3. Two adjacent right lower lobe pulmonary nodules are indeterminate but with history of smoking and emphysematous changes I would recommend a follow-up noncontrast chest CT in 6-12 months. There is also a borderline right infrahilar lymph node which can be re-examined at that time. 4. Calcified granulomas involving the chest and abdomen.  MR angio head without contrast 03/30/2020: 1. No acute intracranial abnormality. 2. Mild age-related cerebral atrophy with chronic small vessel ischemic disease.  Cardiac Studies:   Lexiscan Tetrofosmin stress test 07/12/2019: No previous exam available for comparison. Myocardial perfusion imaging is normal. Left ventricular ejection fraction is 50% with normal wall motion. Low risk study.  Cardiac MR with and without contrast 07/06/2019: 1. Mildly dilated left ventricle with moderate concentric left ventricular hypertrophy and normal left ventricular systolic function (LVEF = 60%). There are no regional wall motion abnormalities. These findings are not consistent with an infiltrative cardiomyopathy. T1 mapping showed normal extracellular volume (ECV) of 22% (normal < 25%). 2.  Normal RV thickness and systolic function. 3.  Moderately dilated left and right atrial size. 4.  Normal aortic root, ascending aorta and pulmonary artery.   5.  Moderate mitral and tricuspid regurgitation. 6.  Normal pericardium.  Carotid artery duplex 03/31/2020: Right Carotid: Velocities in the right ICA are consistent with a 1-39% stenosis. Left Carotid: Velocities in the left ICA are consistent with a 1-39% stenosis. Vertebrals: Bilateral vertebral arteries demonstrate antegrade flow.  Echocardiogram  03/31/2020: 1. Left ventricular ejection fraction, by estimation, is 60 to 65%. The left ventricle has normal function. The left ventricle has no regional wall motion abnormalities. Left ventricular diastolic parameters are consistent with Grade I diastolic  dysfunction (impaired relaxation).  2. Right ventricular systolic function is normal. The right ventricular size is normal.  3. The mitral valve is normal in structure. No evidence of mitral valve regurgitation. No evidence of mitral stenosis.  4. The aortic valve is normal in structure. Aortic valve regurgitation is not visualized. No aortic stenosis is present.  5. The inferior vena cava is normal in size with greater than 50% respiratory variability, suggesting right atrial pressure of 3 mmHg.  Zio Patch Extended out patient monitoring 14 days 04/07/2020 to 04/21/2020:  Predominant rhythm is sinus rhythm.  2 SVT runs occurred, fastest 4 beats at rate 185 and longest 6 beats at 138 bpm.  Occasional PVCs and PACs.  No patient activated events.  EKG:    EKG 10/26/2020: Normal sinus rhythm at rate of 62 bpm, normal axis.  Right bundle branch block.  Low-voltage complexes.  No evidence of ischemia. EKG 02/23/2020: Normal sinus rhythm at rate of 60 bpm, normal axis, right bundle branch block.  No evidence of ischemia.  No significant change from 08/24/2018.  Assessment     ICD-10-CM   1. Atypical chest pain  R07.89   2. Essential hypertension  I10   3. Dyspnea on exertion  R06.00   4. OSA (obstructive sleep apnea)  G47.33     Recommendations:   Derek Ryan  is a 65 y.o. Caucasian male with hypertension, hyperlipidemia, hyperglycemia, anxiety and depression, admitted to the hospital on 03/30/2020 and discharged on 04/02/2020 with left visual loss associated with headache and left facial paresthesia and diagnosed with central retinal artery occlusion and underwent extensive evaluation including outpatient extended telemetry.  No etiology was  found.  He was also evaluated by neurology and it was  agreed upon that he would benefit from loop recorder implantation but patient eventually decided for continued risk factor modification and observation for now.  He made an appointment on a urgent basis due to an episode of chest pain that occurred on 10/26/2020.  His symptoms of chest pain that lasted a few minutes are very atypical at most for angina pectoris and he has had a negative nuclear stress test recently.  He has not had any recurrence of chest pain in spite of walking regularly.  I simply reassured him and advised him to continue primary prevention that includes control of hypertension, hyperlipidemia, weight loss and exercise.  He has been diagnosed with OSA however has not been able to tolerate CPAP.    Adrian Prows, MD, [facility] 11/11/2020, 7:54 AM Office: [phone]   CC: Star Age, MD

## 2020-11-13 ENCOUNTER — Ambulatory Visit: Payer: Self-pay | Admitting: Neurology

## 2020-11-17 ENCOUNTER — Ambulatory Visit: Payer: Medicare Other | Admitting: Cardiology

## 2020-12-01 ENCOUNTER — Other Ambulatory Visit: Payer: Self-pay | Admitting: Cardiology

## 2020-12-01 ENCOUNTER — Ambulatory Visit: Payer: Medicare Other | Admitting: Cardiology

## 2020-12-01 DIAGNOSIS — I10 Essential (primary) hypertension: Secondary | ICD-10-CM

## 2020-12-01 DIAGNOSIS — I110 Hypertensive heart disease with heart failure: Secondary | ICD-10-CM

## 2020-12-03 DIAGNOSIS — G4733 Obstructive sleep apnea (adult) (pediatric): Secondary | ICD-10-CM | POA: Diagnosis not present

## 2020-12-13 ENCOUNTER — Other Ambulatory Visit: Payer: Self-pay | Admitting: Family Medicine

## 2020-12-13 DIAGNOSIS — F411 Generalized anxiety disorder: Secondary | ICD-10-CM

## 2021-01-02 DIAGNOSIS — G4733 Obstructive sleep apnea (adult) (pediatric): Secondary | ICD-10-CM | POA: Diagnosis not present

## 2021-01-26 ENCOUNTER — Ambulatory Visit: Payer: Medicare Other | Admitting: Cardiology

## 2021-02-02 DIAGNOSIS — G4733 Obstructive sleep apnea (adult) (pediatric): Secondary | ICD-10-CM | POA: Diagnosis not present

## 2021-02-07 ENCOUNTER — Other Ambulatory Visit: Payer: Self-pay | Admitting: Family Medicine

## 2021-02-07 DIAGNOSIS — F411 Generalized anxiety disorder: Secondary | ICD-10-CM

## 2021-02-07 DIAGNOSIS — F321 Major depressive disorder, single episode, moderate: Secondary | ICD-10-CM

## 2021-02-11 ENCOUNTER — Other Ambulatory Visit: Payer: Self-pay | Admitting: Family Medicine

## 2021-02-11 DIAGNOSIS — E785 Hyperlipidemia, unspecified: Secondary | ICD-10-CM

## 2021-03-04 DIAGNOSIS — G4733 Obstructive sleep apnea (adult) (pediatric): Secondary | ICD-10-CM | POA: Diagnosis not present

## 2021-04-20 ENCOUNTER — Encounter: Payer: Self-pay | Admitting: Family Medicine

## 2021-04-20 ENCOUNTER — Other Ambulatory Visit: Payer: Self-pay

## 2021-04-20 ENCOUNTER — Ambulatory Visit (INDEPENDENT_AMBULATORY_CARE_PROVIDER_SITE_OTHER): Payer: Medicare Other | Admitting: Family Medicine

## 2021-04-20 VITALS — BP 118/65 | HR 66 | Temp 97.6°F | Ht 68.0 in | Wt 235.4 lb

## 2021-04-20 DIAGNOSIS — I10 Essential (primary) hypertension: Secondary | ICD-10-CM

## 2021-04-20 DIAGNOSIS — F321 Major depressive disorder, single episode, moderate: Secondary | ICD-10-CM

## 2021-04-20 DIAGNOSIS — G4733 Obstructive sleep apnea (adult) (pediatric): Secondary | ICD-10-CM | POA: Diagnosis not present

## 2021-04-20 DIAGNOSIS — E785 Hyperlipidemia, unspecified: Secondary | ICD-10-CM | POA: Diagnosis not present

## 2021-04-20 DIAGNOSIS — F411 Generalized anxiety disorder: Secondary | ICD-10-CM | POA: Diagnosis not present

## 2021-04-20 DIAGNOSIS — R7303 Prediabetes: Secondary | ICD-10-CM | POA: Diagnosis not present

## 2021-04-20 DIAGNOSIS — Z87891 Personal history of nicotine dependence: Secondary | ICD-10-CM | POA: Diagnosis not present

## 2021-04-20 DIAGNOSIS — Z1211 Encounter for screening for malignant neoplasm of colon: Secondary | ICD-10-CM

## 2021-04-20 DIAGNOSIS — Z0000 Encounter for general adult medical examination without abnormal findings: Secondary | ICD-10-CM

## 2021-04-20 DIAGNOSIS — F4321 Adjustment disorder with depressed mood: Secondary | ICD-10-CM | POA: Diagnosis not present

## 2021-04-20 DIAGNOSIS — R918 Other nonspecific abnormal finding of lung field: Secondary | ICD-10-CM

## 2021-04-20 DIAGNOSIS — N400 Enlarged prostate without lower urinary tract symptoms: Secondary | ICD-10-CM

## 2021-04-20 LAB — BAYER DCA HB A1C WAIVED: HB A1C (BAYER DCA - WAIVED): 6.2 % (ref <7.0)

## 2021-04-20 NOTE — Progress Notes (Signed)
Derek Ryan is a 65 y.o. male presents to office today for annual physical exam examination.    Concerns today include: 1.  Grieving Patient reports that his nephew died from a sudden heart attack at age 58 last week.  This was totally unexpected and he has been trying to be supportive of his sister.  He is compliant with his medications.  He feels that he is working through this and coping okay.  2.  Pulmonary nodules Patient noted to have pulmonary nodules in the right lung in January 2021.  He is asking for repeat CT to follow these.  No hemoptysis, unplanned weight loss, night sweats.  He is a former smoker that quit in 1992.  Occupation: Still works some,  Substance use: Former smoker that quit 1992.  No alcohol since 1997.  No drug use Diet: Fair.  Admits that he enjoys sweets but does incorporate good amount of vegetables, Exercise: Stays physically active Last eye exam: Up-to-date Last dental exam: Up-to-date Last colonoscopy: Needs.  Previously seen in [place] needed today: None Immunizations needed: Immunization History  Administered Date(s) Administered   [employer] Vaccination 12/14/2019, 01/11/2020, 08/14/2020   Pneumococcal Conjugate-13 07/26/2019   Tdap 01/18/2012     Past Medical History:  Diagnosis Date   Central retinal artery occlusion of left eye    Chronic diastolic heart failure ([place]) 08/25/2019   Depression    GERD (gastroesophageal reflux disease)    Hyperlipidemia    Hypertension    LVH (left ventricular hypertrophy) due to hypertensive disease 08/25/2019   OSA (obstructive sleep apnea)    unable to tolerate CPAP   Social History   Socioeconomic History   Marital status: Widowed    Spouse name: Not on file   Number of children: 3   Years of education: Not on file   Highest education level: Not on file  Occupational History   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  Tobacco Use   Smoking status: Former    Packs/day: 1.00     Years: 23.00    Pack years: 23.00    Types: Cigarettes    Quit date: 1992    Years since quitting: 30.6   Smokeless tobacco: Never  Vaping Use   Vaping Use: Never used  Substance and Sexual Activity   Alcohol use: Not Currently    Comment: stopped in 1997   Drug use: Never   Sexual activity: Not on file  Other Topics Concern   Not on file  Social History Narrative   Not on file   Social Determinants of Health   Financial Resource Strain: Not on file  Food Insecurity: Not on file  Transportation Needs: Not on file  Physical Activity: Not on file  Stress: Not on file  Social Connections: Not on file  Intimate Partner Violence: Not on file   History reviewed. No pertinent surgical history. Family History  Problem Relation Age of Onset   Heart attack Mother    Alcohol abuse Father    Stomach cancer Sister    Hypertension Brother    Heart attack Brother    Brain cancer Brother    Heart attack Nephew 49       deceased from massive MI    Current Outpatient Medications:    aspirin EC 81 MG tablet, Take 81 mg by mouth daily., Disp: , Rfl:    atorvastatin (LIPITOR) 10 MG tablet, TAKE 1 TABLET BY MOUTH EVERYDAY AT BEDTIME, Disp: 90 tablet, Rfl: 0  busPIRone (BUSPAR) 10 MG tablet, TAKE 1 TABLET (10 MG TOTAL) BY MOUTH 3 (THREE) TIMES DAILY. FOR ANXIETY, Disp: 270 tablet, Rfl: 1   cholecalciferol (VITAMIN D3) 25 MCG (1000 UT) tablet, Take 2,000 Units by mouth daily., Disp: , Rfl:    escitalopram (LEXAPRO) 20 MG tablet, TAKE 1 TABLET BY MOUTH EVERY DAY, Disp: 90 tablet, Rfl: 0   losartan-hydrochlorothiazide (HYZAAR) 50-12.5 MG tablet, TAKE 1 TABLET BY MOUTH EVERY DAY, Disp: 90 tablet, Rfl: 2   Omega-3 Fatty Acids (FISH OIL) 1000 MG CAPS, Take by mouth., Disp: , Rfl:    vitamin C (ASCORBIC ACID) 500 MG tablet, Take 500 mg by mouth daily., Disp: , Rfl:    nitroGLYCERIN (NITROSTAT) 0.4 MG SL tablet, Place 1 tablet (0.4 mg total) under the tongue every 5 (five) minutes as needed for  chest pain. (Patient not taking: Reported on 04/20/2021), Disp: 30 tablet, Rfl: 3  Allergies  Allergen Reactions   Doxycycline Itching    As of 03/31/20, pt does not recall this reaction     ROS: Review of Systems Pertinent items noted in HPI and remainder of comprehensive ROS otherwise negative.    Physical exam BP 118/65   Pulse 66   Temp 97.6 F (36.4 C) (Temporal)   Ht '5\' 8"'$  (1.727 m)   Wt 235 lb 6.4 oz (106.8 kg)   SpO2 95%   BMI 35.79 kg/m  General appearance: alert, cooperative, appears stated age, and no distress Head: Normocephalic, without obvious abnormality, atraumatic Eyes: negative findings: lids and lashes normal, conjunctivae and sclerae normal, corneas clear, and pupils equal, round, reactive to light and accomodation Ears: normal TM's and external ear canals both ears Nose: Nares normal. Septum midline. Mucosa normal. No drainage or sinus tenderness. Throat: lips, mucosa, and tongue normal; teeth and gums normal Neck: no adenopathy, no carotid bruit, supple, symmetrical, trachea midline, and thyroid not enlarged, symmetric, no tenderness/mass/nodules Back: symmetric, no curvature. ROM normal. No CVA tenderness. Lungs: clear to auscultation bilaterally Chest wall: no tenderness Heart: regular rate and rhythm, S1, S2 normal, no murmur, click, rub or gallop Abdomen: soft, non-tender; bowel sounds normal; no masses,  no organomegaly Extremities: extremities normal, atraumatic, no cyanosis or edema Pulses: 2+ and symmetric Skin: Skin color, texture, turgor normal. No rashes or lesions or multiple tattoos in the upper and lower extremities Lymph nodes: Cervical, supraclavicular, and axillary nodes normal. Neurologic: Grossly normal Psych: Mood is stable.  Patient is very pleasant and interactive  Depression screen [facility] 2/9 04/20/2021 10/18/2020 04/20/2020  Decreased Interest 2 0 0  Down, Depressed, Hopeless 2 0 0  PHQ - 2 Score 4 0 0  Altered sleeping 2 2 -  Tired,  decreased energy 3 0 -  Change in appetite 2 0 -  Feeling bad or failure about yourself  2 0 -  Trouble concentrating 2 0 -  Moving slowly or fidgety/restless 2 0 -  Suicidal thoughts 0 0 -  PHQ-9 Score 17 2 -  Difficult doing work/chores - - -   GAD 7 : Generalized Anxiety Score 04/20/2021 10/18/2020 12/13/2019 07/26/2019  Nervous, Anxious, on Edge '2 1 2 2  '$ Control/stop worrying 2 0 2 1  Worry too much - different things 2 0 2 1  Trouble relaxing '2 2 2 1  '$ Restless 2 0 2 1  Easily annoyed or irritable 2 0 1 0  Afraid - awful might happen 2 0 1 0  Total GAD 7 Score '14 3 12 6  '$ Anxiety  Difficulty Somewhat difficult - Not difficult at all Not difficult at all       Assessment/ Plan: [name] here for annual physical exam.   Annual physical exam  Colon cancer screening - Plan: Ambulatory referral to Gastroenterology  Multiple pulmonary nodules - Plan: CT CHEST NODULE FOLLOW UP LOW DOSE W/O  Former smoker - Plan: CT CHEST NODULE FOLLOW UP LOW DOSE W/O  Essential hypertension - Plan: Basic Metabolic Panel  Pre-diabetes - Plan: Bayer DCA Hb A1c Waived  Dyslipidemia  Severe obstructive sleep apnea  Anxiety, generalized - Plan: AMB Referral to [facility]  Moderate single current episode of major depressive disorder ([place]) - Plan: AMB Referral to [facility]ordin[facility]  Grief reaction - Plan: AMB Referral to [place]  Benign prostatic hyperplasia without lower urinary tract symptoms - Plan: PSA  Referral for colonoscopy placed.  CT of chest ordered to follow-up on pulmonary nodules noted in January 2021. Blood pressure is at goal.  No changes  A1c shows ongoing prediabetes.  Reinforced carb restriction  Not yet due for fasting lipid panel  Continue CPAP  Continue Lexapro, BuSpar.  I am going to refer him to CCM for grief counseling.  Asymptomatic from a BPH standpoint.  Check PSA  Counseled on healthy lifestyle choices,  including diet (rich in fruits, vegetables and lean meats and low in salt and simple carbohydrates) and exercise (at least 30 minutes of moderate physical activity daily).     M. Lajuana Ripple, DO

## 2021-04-20 NOTE — Patient Instructions (Addendum)
You had a CT scan of your lungs in 08/2019 that showed a few nodules.  I have reordered this to recheck these spots.  You will get a call next week to set up an appointment.  Referral for colonoscopy in.  Preventing Type 2 Diabetes Mellitus Type 2 diabetes, also called type 2 diabetes mellitus, is a long-term (chronic) disease that affects sugar (glucose) levels in your blood. Normally, a hormone called insulin allows glucose to enter cells in your body. The cells use glucose for energy. With type 2 diabetes, you will have one or both of these problems: Your pancreas does not make enough insulin. Cells in your body do not respond properly to insulin that your body makes (insulin resistance). Insulin resistance or lack of insulin causes extra glucose to build up in the blood instead of going into cells. As a result, high blood glucose (hyperglycemia) develops. That can cause many complications. Being overweight or obese and having an inactive (sedentary) lifestyle can increase your risk for diabetes. Type 2 diabetes can be delayed or prevented by making certain nutrition and lifestyle changes. How can this condition affect me? If you do not take steps to prevent diabetes, your blood glucose levels may keep increasing over time. Too much glucose in your blood for a long time can damage your blood vessels, heart, kidneys, nerves, and eyes. Type 2 diabetes can lead to chronic health problems and complications, such as: Heart disease. Stroke. Blindness. Kidney disease. Depression. Poor circulation in your feet and legs. In severe cases, a foot or leg may need to be surgically removed (amputated). What can increase my risk? You may be more likely to develop type 2 diabetes if you: Have type 2 diabetes in your family. Are overweight or obese. Have a sedentary lifestyle. Have insulin resistance or a history of prediabetes. Have a history of pregnancy-related (gestational) diabetes or polycystic ovary  syndrome (PCOS). What actions can I take to prevent this? It can be difficult to recognize signs of type 2 diabetes. Taking action to prevent the disease before you develop symptoms is the best way to avoid possible damage to your body. Making certain nutrition and lifestyle changes may prevent or delay the disease and related health problems. Nutrition  Eat healthy meals and snacks regularly. Do not skip meals. Fruit or a handful of nuts is a healthy snack between meals. Drink water throughout the day. Avoid drinks that contain added sugar, such as soda or sweetened tea. Drink enough fluid to keep your urine pale yellow. Follow instructions from your health care provider about eating or drinking restrictions. Limit the amount of food you eat by: Managing how much you eat at a time (portion size). Checking food labels for the serving sizes of food. Using a kitchen scale to weigh amounts of food. Saut or steam food instead of frying it. Cook with water or broth instead of oils or butter. Limit saturated fat and salt (sodium) in your diet. Have no more than 1 tsp (2,400 mg) of sodium a day. If you have heart disease or high blood pressure, use less than ? tsp (1,500 mg) of sodium a day. Lifestyle  Lose weight if needed and as told. Your health care provider can determine how much weight loss is best for you and can help you lose weight safely. If you are overweight or obese, you may be told to lose at least 5?7% of your body weight. Manage blood pressure, cholesterol, and stress. Your health care provider will  help determine the best treatment for you. Do not use any products that contain nicotine or tobacco. These products include cigarettes, chewing tobacco, and vaping devices, such as e-cigarettes. If you need help quitting, ask your health care provider. Activity  Do physical activity that makes your heart beat faster and makes you sweat (moderate intensity). Do this for at least 30  minutes on at least 5 days of the week, or as much as told by your health care provider. Ask your health care provider what activities are safe for you. A mix of activities may be best, such as walking, swimming, cycling, and strength training. Try to add physical activity into your day. For example: Park your car farther away than usual so that you walk more. Take a walk during your lunch break. Use stairs instead of elevators or escalators. Walk or bike to work instead of driving. Alcohol use If you drink alcohol: Limit how much you have to: 0?1 drink a day for women who are not pregnant. 0?2 drinks a day for men. Know how much alcohol is in your drink. In the [place] one drink equals one 12 oz bottle of beer (355 mL), one 5 oz glass of wine (148 mL), or one 1 oz glass of hard liquor (44 mL). General information Talk with your health care provider about your risk factors and how you can reduce your risk for diabetes. Have your blood glucose tested regularly, as told by your health care provider. Get screening tests as told by your health care provider. You may have these regularly, especially if you have certain risk factors for type 2 diabetes. Make an appointment with a registered dietitian. This diet and nutrition specialist can help you make a healthy eating plan and help you understand portion sizes and food labels. Where to find support Ask your health care provider to recommend a registered dietitian, a certified diabetes care and education specialist, or a weight loss program. Look for local or online weight loss groups. Join a gym, fitness club, or outdoor activity group, such as a walking club. Where to find more information For help and guidance and to learn more about diabetes and diabetes prevention, visit: American Diabetes Association (ADA): www.diabetes.[employer] of Diabetes and Digestive and Kidney Diseases: [employer].dk To learn more about healthy  eating, visit: U.S. Department of Agriculture Scientist, research (physical sciences)): FormerBoss.no Office of Disease Prevention and Health Promotion (ODPHP): [place] Summary You can delay or prevent type 2 diabetes by eating healthy foods, losing weight if needed, and increasing your physical activity. Talk with your health care provider about your risk factors for type 2 diabetes and how you can reduce your risk. It can be difficult to recognize the signs of type 2 diabetes. The best way to avoid possible damage to your body is to take action to prevent the disease before you develop symptoms. Get screening tests as told by your health care provider. This information is not intended to replace advice given to you by your health care provider. Make sure you discuss any questions you have with your health care provider. Document Revised: 10/30/2020 Document Reviewed: 10/30/2020 Elsevier Patient Education  [place].

## 2021-04-21 LAB — BASIC METABOLIC PANEL
BUN/Creatinine Ratio: 14 (ref 10–24)
BUN: 13 mg/dL (ref 8–27)
CO2: 24 mmol/L (ref 20–29)
Calcium: 9.6 mg/dL (ref 8.6–10.2)
Chloride: 97 mmol/L (ref 96–106)
Creatinine, Ser: 0.93 mg/dL (ref 0.76–1.27)
Glucose: 89 mg/dL (ref 65–99)
Potassium: 4.2 mmol/L (ref 3.5–5.2)
Sodium: 138 mmol/L (ref 134–144)
eGFR: 91 mL/min/{1.73_m2} (ref >59)

## 2021-04-21 LAB — PSA: Prostate Specific Ag, Serum: 0.7 ng/mL (ref 0.0–4.0)

## 2021-04-24 ENCOUNTER — Ambulatory Visit (INDEPENDENT_AMBULATORY_CARE_PROVIDER_SITE_OTHER): Payer: Medicare Other

## 2021-04-24 VITALS — Ht 68.0 in | Wt 235.0 lb

## 2021-04-24 DIAGNOSIS — Z0000 Encounter for general adult medical examination without abnormal findings: Secondary | ICD-10-CM | POA: Diagnosis not present

## 2021-04-24 NOTE — Progress Notes (Signed)
Subjective:   Derek Ryan is a 64 y.o. male who presents for an Initial Medicare Annual Wellness Visit. Virtual Visit via Telephone Note  I connected with  [name] on 04/24/21 at 10:30 AM EDT by telephone and verified that I am speaking with the correct person using two identifiers.  Location: Patient: Home Provider: [facility] Persons participating in the virtual visit: patient/Nurse Health Advisor   I discussed the limitations, risks, security and privacy concerns of performing an evaluation and management service by telephone and the availability of in person appointments. The patient expressed understanding and agreed to proceed.  Interactive audio and video telecommunications were attempted between this nurse and patient, however failed, due to patient having technical difficulties OR patient did not have access to video capability.  We continued and completed visit with audio only.  Some vital signs may be absent or patient reported.   Chriss Driver, LPN  Review of Systems     Cardiac Risk Factors include: advanced age (>33mn, >>30women);male gender;sedentary lifestyle;obesity (BMI >30kg/m2)     Objective:    Today's Vitals   04/24/21 1030  Weight: 235 lb (106.6 kg)  Height: '5\' 8"'$  (1.727 m)   Body mass index is 35.73 kg/m.  Advanced Directives 04/24/2021 04/20/2020 03/30/2020  Does Patient Have a Medical Advance Directive? Yes No No  Type of Advance Directive Living will - -  Would patient like information on creating a medical advance directive? No - Patient declined No - Patient declined No - Patient declined    Current Medications (verified) Outpatient Encounter Medications as of 04/24/2021  Medication Sig   aspirin EC 81 MG tablet Take 81 mg by mouth daily.   atorvastatin (LIPITOR) 10 MG tablet TAKE 1 TABLET BY MOUTH EVERYDAY AT BEDTIME   busPIRone (BUSPAR) 10 MG tablet TAKE 1 TABLET (10 MG TOTAL) BY MOUTH 3 (THREE) TIMES DAILY. FOR ANXIETY    cholecalciferol (VITAMIN D3) 25 MCG (1000 UT) tablet Take 2,000 Units by mouth daily.   escitalopram (LEXAPRO) 20 MG tablet TAKE 1 TABLET BY MOUTH EVERY DAY   losartan-hydrochlorothiazide (HYZAAR) 50-12.5 MG tablet TAKE 1 TABLET BY MOUTH EVERY DAY   nitroGLYCERIN (NITROSTAT) 0.4 MG SL tablet Place 1 tablet (0.4 mg total) under the tongue every 5 (five) minutes as needed for chest pain. (Patient not taking: Reported on 04/20/2021)   Omega-3 Fatty Acids (FISH OIL) 1000 MG CAPS Take by mouth.   vitamin C (ASCORBIC ACID) 500 MG tablet Take 500 mg by mouth daily.   No facility-administered encounter medications on file as of 04/24/2021.    Allergies (verified) Doxycycline   History: Past Medical History:  Diagnosis Date   Central retinal artery occlusion of left eye    Chronic diastolic heart failure ([place] 08/25/2019   Depression    GERD (gastroesophageal reflux disease)    Hyperlipidemia    Hypertension    LVH (left ventricular hypertrophy) due to hypertensive disease 08/25/2019   OSA (obstructive sleep apnea)    unable to tolerate CPAP   No past surgical history on file. 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   Works at [employer] Part time.    Social Determinants of Health   Financial Resource Strain: Low Risk    Difficulty of Paying Living Expenses: Not hard at all  Food Insecurity: No Food Insecurity   Worried About Charity fundraiser in the Last Year: Never true   [place] in the Last Year: Never true  Transportation Needs: No Transportation Needs   Lack of Transportation (Medical): No   Lack of Transportation (Non-Medical): No  Physical Activity: Insufficiently Active   Days of Exercise per Week: 3 days   Minutes of Exercise per Session: 20 min  Stress: Stress Concern Present   Feeling of Stress : To some extent  Social Connections: Socially Isolated   Frequency of Communication with Friends and Family: More than three times a week   Frequency of Social Gatherings with Friends and Family: More than three times a week   Attends Religious Services: Never   Marine scientist or Organizations: No   Attends Archivist Meetings: Never   Marital Status: Widowed    Tobacco Counseling Counseling given: Not Answered   Clinical Intake:  Pre-visit preparation completed: Yes  Pain : No/denies pain     BMI - recorded: 35.73 Nutritional Status: BMI > 30  Obese Nutritional Risks: Other (Comment) Diabetes: No  How often do you need to have someone help you when you read instructions, pamphlets, or other written materials from your doctor or pharmacy?: 1 - Never  Diabetic?No, pt is pre-diabetic. Pt states he is currently watching carb intake.   Interpreter Needed?: No  Information entered by :: Randal Buba, LPN   Activities of Daily Living In your present state of health, do you have any difficulty performing the following activities: 04/24/2021  Hearing? N  Vision? N  Difficulty  concentrating or making decisions? N  Walking or climbing stairs? N  Dressing or bathing? N  Doing errands, shopping? N  Preparing Food and eating ? N  Using the Toilet? N  In the past six months, have you accidently leaked urine? N  Do you have problems with loss of bowel control? N  Managing your Medications? N  Managing your Finances? N  Housekeeping or managing your Housekeeping? N  Some recent data might be hidden    Patient Care Team: Janora Norlander, DO as PCP - General (Family Medicine)  Indicate any recent Medical Services you may have received from other than [facility] providers in the past year (date may be approximate).     Assessment:   This is a routine wellness examination  for Reeder.  Hearing/Vision screen Hearing Screening - Comments:: Slight hearing issues per pt. No hearing aids.  Vision Screening - Comments:: Readers. 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 Dr. Marin Comment.  Dietary issues and exercise activities discussed: Current Exercise Habits: The patient has a physically strenuous job, but has no regular exercise apart from work.   Goals Addressed             This Visit's Progress    exercise       Pt would like to eat better and exercise more.      Have 3 meals a day       Watching carb intake.       Depression Screen PHQ 2/9 Scores 04/24/2021 04/20/2021 10/18/2020 04/20/2020 04/07/2020 12/13/2019 07/26/2019  PHQ - 2 Score 4 4 0 0 0 0 2  PHQ- 9 Score '16 17 2 '$ - - 4 12    Fall Risk Fall Risk  04/24/2021 04/20/2021 04/20/2020 04/07/2020 12/13/2019  Falls in the past year? 0 0 0 0 1  Number falls in past yr: 0 - - - 0  Injury with Fall? 0 - - - 0  Risk for fall due to : No Fall Risks - - - History of fall(s)  Follow up Falls prevention discussed - - - Falls prevention discussed    FALL RISK PREVENTION PERTAINING TO THE HOME:  Any stairs in or around the home? No  If so, are there any without handrails? No  Home free of loose throw rugs in walkways, pet  beds, electrical cords, etc? Yes  Adequate lighting in your home to reduce risk of falls? Yes   ASSISTIVE DEVICES UTILIZED TO PREVENT FALLS:  Life alert? No  Use of a cane, walker or w/c? No  Grab bars in the bathroom? Yes  Shower chair or bench in shower? Yes  Elevated toilet seat or a handicapped toilet? Yes   TIMED UP AND GO:  Was the test performed? No . Phone visit.     Cognitive Function:     6CIT Screen 04/24/2021 04/20/2020  What Year? 0 points 0 points  What month? 0 points 0 points  What time? 0 points 0 points  Count back from 20 0 points 0 points  Months in reverse 0 points 0 points  Repeat phrase 2 points 0 points  Total Score 2 0    Immunizations Immunization History  Administered Date(s) Administered   Moderna Sars-Covid-2 Vaccination 12/14/2019, 01/11/2020, 08/14/2020   Pneumococcal Conjugate-13 07/26/2019   Tdap 01/18/2012    TDAP status: Up to date  Flu Vaccine status: Due, Education has been provided regarding the importance of this vaccine. Advised may receive this vaccine at local pharmacy or Health Dept. Aware to provide a copy of the vaccination record if obtained from local pharmacy or Health Dept. Verbalized acceptance and understanding.  Pneumococcal vaccine status: Due, Education has been provided regarding the importance of this vaccine. Advised may receive this vaccine at local pharmacy or Health Dept. Aware to provide a copy of the vaccination record if obtained from local pharmacy or Health Dept. Verbalized acceptance and understanding.  Covid-19 vaccine status: Completed vaccines  Qualifies for Shingles Vaccine? Yes   Zostavax completed No   Shingrix Completed?: No.    Education has been provided regarding the importance of this vaccine. Patient has been advised to call insurance company to determine out of pocket expense if they have not yet received this vaccine. Advised may also receive  vaccine at local pharmacy or Health Dept.  Verbalized acceptance and understanding.  Screening Tests Health Maintenance  Topic Date Due   COLONOSCOPY (Pts 45-39yr Insurance coverage will need to be confirmed)  05/06/2017   COVID-19 Vaccine (4 - Booster for Moderna series) 05/06/2021 (Originally 12/13/2020)   Zoster Vaccines- Shingrix (1 of 2) 07/20/2021 (Originally 07/01/2004)   INFLUENZA VACCINE  11/16/2021 (Originally 03/19/2021)   PNA vac Low Risk Adult (2 of 2 - PPSV23) 04/20/2022 (Originally 07/25/2020)   TETANUS/TDAP  01/17/2022   Hepatitis C Screening  Completed   HPV VACCINES  Aged Out    Health Maintenance  Health Maintenance Due  Topic Date Due   COLONOSCOPY (Pts 45-472yrInsurance coverage will need to be confirmed)  05/06/2017    Colorectal cancer screening: Type of screening: Colonoscopy. Completed 05/06/2014. Repeat every 3 years. Order placed by Dr. GoLajuana Ripplen 04/20/2021.   Lung Cancer Screening: (Low Dose CT Chest recommended if Age 67-80ears, 30 pack-year currently smoking OR have quit w/in 15years.) does not qualify. Order placed by Dr. GoLajuana Ripplen 04/20/2021 for Chest CT due to hx of pulmonary nodules.   Additional Screening:  Hepatitis C Screening: does qualify; Completed 07/26/2019  Vision Screening: Recommended annual ophthalmology exams for early detection of glaucoma and other disorders of the eye. Is the patient up to date with their annual eye exam?  No  Who is the provider or what is the name of the office in which the patient attends annual eye exams? Dr. LeMarin Commentf pt is not established with a provider, would they like to be referred to a provider to establish care? No .   Dental Screening: Recommended annual dental exams for proper oral hygiene  Community Resource Referral / Chronic Care Management: CRR required this visit?  No   CCM required this visit?  No      Plan:     I have personally reviewed and noted the following in the patient's chart:   Medical and social history Use of  alcohol, tobacco or illicit drugs  Current medications and supplements including opioid prescriptions. Patient is not currently taking opioid prescriptions. Functional ability and status Nutritional status Physical activity Advanced directives List of other physicians Hospitalizations, surgeries, and ER visits in previous 12 months Vitals Screenings to include cognitive, depression, and falls Referrals and appointments  In addition, I have reviewed and discussed with patient certain preventive protocols, quality metrics, and best practice recommendations. A written personalized care plan for preventive services as well as general preventive health recommendations were provided to patient.     MaChriss DriverLPN   [id number] Nurse Notes: Orders placed by Dr. GoLajuana Ripplen 04/20/2021 for colonoscopy and chest CT. Pt advised is he hasn't heard from our office within the next week to call to check on appointment status. Pt verbalized understanding of all.

## 2021-04-24 NOTE — Patient Instructions (Signed)
Derek Ryan , Thank you for taking time to come for your Medicare Wellness Visit. I appreciate your ongoing commitment to your health goals. Please review the following plan we discussed and let me know if I can assist you in the future.   Screening recommendations/referrals: Colonoscopy: 05/06/2014, order placed by Dr. Lajuana Ripple on 04/20/21 to schedule. If you don't hear from our office in the next week with appointment, give [place] a call.  Recommended yearly ophthalmology/optometry visit for glaucoma screening and checkup Recommended yearly dental visit for hygiene and checkup  Vaccinations: Influenza vaccine: Due in fall Pneumococcal vaccine: 07/26/2019, Second dose due. Tdap vaccine: 01/18/2012 Repeat in 10 years  Shingles vaccine: Shingrix discussed. Please contact your pharmacy for coverage information.     Covid-19: Done 08/14/20 01/11/20 12/14/19  Advanced directives: Please bring a copy of your health care power of attorney and living will to the office to be added to your chart at your convenience.   Conditions/risks identified: Aim for 30 minutes of exercise or brisk walking each day, drink 6-8 glasses of water and eat lots of fruits and vegetables.   Next appointment: Follow up in one year for your annual wellness visit.   Preventive Care 27 Years and Older, Male  Preventive care refers to lifestyle choices and visits with your health care provider that can promote health and wellness. What does preventive care include? A yearly physical exam. This is also called an annual well check. Dental exams once or twice a year. Routine eye exams. Ask your health care provider how often you should have your eyes checked. Personal lifestyle choices, including: Daily care of your teeth and gums. Regular physical activity. Eating a healthy diet. Avoiding tobacco and drug use. Limiting alcohol use. Practicing safe sex. Taking low doses of aspirin every day. Taking vitamin and mineral  supplements as recommended by your health care provider. What happens during an annual well check? The services and screenings done by your health care provider during your annual well check will depend on your age, overall health, lifestyle risk factors, and family history of disease. Counseling  Your health care provider may ask you questions about your: Alcohol use. Tobacco use. Drug use. Emotional well-being. Home and relationship well-being. Sexual activity. Eating habits. History of falls. Memory and ability to understand (cognition). Work and work Statistician. Screening  You may have the following tests or measurements: Height, weight, and BMI. Blood pressure. Lipid and cholesterol levels. These may be checked every 5 years, or more frequently if you are over 76 years old. Skin check. Lung cancer screening. You may have this screening every year starting at age 67 if you have a 30-pack-year history of smoking and currently smoke or have quit within the past 15 years. Fecal occult blood test (FOBT) of the stool. You may have this test every year starting at age 65. Flexible sigmoidoscopy or colonoscopy. You may have a sigmoidoscopy every 5 years or a colonoscopy every 10 years starting at age 32. Prostate cancer screening. Recommendations will vary depending on your family history and other risks. Hepatitis C blood test. Hepatitis B blood test. Sexually transmitted disease (STD) testing. Diabetes screening. This is done by checking your blood sugar (glucose) after you have not eaten for a while (fasting). You may have this done every 1-3 years. Abdominal aortic aneurysm (AAA) screening. You may need this if you are a current or former smoker. Osteoporosis. You may be screened starting at age 45 if you are at high risk.  Talk with your health care provider about your test results, treatment options, and if necessary, the need for more tests. Vaccines  Your health care provider  may recommend certain vaccines, such as: Influenza vaccine. This is recommended every year. Tetanus, diphtheria, and acellular pertussis (Tdap, Td) vaccine. You may need a Td booster every 10 years. Zoster vaccine. You may need this after age 76. Pneumococcal 13-valent conjugate (PCV13) vaccine. One dose is recommended after age 98. Pneumococcal polysaccharide (PPSV23) vaccine. One dose is recommended after age 61. Talk to your health care provider about which screenings and vaccines you need and how often you need them. This information is not intended to replace advice given to you by your health care provider. Make sure you discuss any questions you have with your health care provider. Document Released: 09/01/2015 Document Revised: 04/24/2016 Document Reviewed: 06/06/2015 Elsevier Interactive Patient Education  2017 [employer] Prevention in the Home Falls can cause injuries. They can happen to people of all ages. There are many things you can do to make your home safe and to help prevent falls. What can I do on the outside of my home? Regularly fix the edges of walkways and driveways and fix any cracks. Remove anything that might make you trip as you walk through a door, such as a raised step or threshold. Trim any bushes or trees on the path to your home. Use bright outdoor lighting. Clear any walking paths of anything that might make someone trip, such as rocks or tools. Regularly check to see if handrails are loose or broken. Make sure that both sides of any steps have handrails. Any raised decks and porches should have guardrails on the edges. Have any leaves, snow, or ice cleared regularly. Use sand or salt on walking paths during winter. Clean up any spills in your garage right away. This includes oil or grease spills. What can I do in the bathroom? Use night lights. Install grab bars by the toilet and in the tub and shower. Do not use towel bars as grab bars. Use  non-skid mats or decals in the tub or shower. If you need to sit down in the shower, use a plastic, non-slip stool. Keep the floor dry. Clean up any water that spills on the floor as soon as it happens. Remove soap buildup in the tub or shower regularly. Attach bath mats securely with double-sided non-slip rug tape. Do not have throw rugs and other things on the floor that can make you trip. What can I do in the bedroom? Use night lights. Make sure that you have a light by your bed that is easy to reach. Do not use any sheets or blankets that are too big for your bed. They should not hang down onto the floor. Have a firm chair that has side arms. You can use this for support while you get dressed. Do not have throw rugs and other things on the floor that can make you trip. What can I do in the kitchen? Clean up any spills right away. Avoid walking on wet floors. Keep items that you use a lot in easy-to-reach places. If you need to reach something above you, use a strong step stool that has a grab bar. Keep electrical cords out of the way. Do not use floor polish or wax that makes floors slippery. If you must use wax, use non-skid floor wax. Do not have throw rugs and other things on the floor that can make  you trip. What can I do with my stairs? Do not leave any items on the stairs. Make sure that there are handrails on both sides of the stairs and use them. Fix handrails that are broken or loose. Make sure that handrails are as long as the stairways. Check any carpeting to make sure that it is firmly attached to the stairs. Fix any carpet that is loose or worn. Avoid having throw rugs at the top or bottom of the stairs. If you do have throw rugs, attach them to the floor with carpet tape. Make sure that you have a light switch at the top of the stairs and the bottom of the stairs. If you do not have them, ask someone to add them for you. What else can I do to help prevent falls? Wear  shoes that: Do not have high heels. Have rubber bottoms. Are comfortable and fit you well. Are closed at the toe. Do not wear sandals. If you use a stepladder: Make sure that it is fully opened. Do not climb a closed stepladder. Make sure that both sides of the stepladder are locked into place. Ask someone to hold it for you, if possible. Clearly mark and make sure that you can see: Any grab bars or handrails. First and last steps. Where the edge of each step is. Use tools that help you move around (mobility aids) if they are needed. These include: Canes. Walkers. Scooters. Crutches. Turn on the lights when you go into a dark area. Replace any light bulbs as soon as they burn out. Set up your furniture so you have a clear path. Avoid moving your furniture around. If any of your floors are uneven, fix them. If there are any pets around you, be aware of where they are. Review your medicines with your doctor. Some medicines can make you feel dizzy. This can increase your chance of falling. Ask your doctor what other things that you can do to help prevent falls. This information is not intended to replace advice given to you by your health care provider. Make sure you discuss any questions you have with your health care provider. Document Released: 06/01/2009 Document Revised: 01/11/2016 Document Reviewed: 09/09/2014 Elsevier Interactive Patient Education  2017 [employer].

## 2021-04-25 ENCOUNTER — Telehealth: Payer: Self-pay

## 2021-04-25 NOTE — Chronic Care Management (AMB) (Signed)
  Chronic Care Management   Note  04/25/2021 Name: Derek Ryan MRN: [id number] DOB: [birth year]  Derek Ryan is a 65 y.o. year old male who is a primary care patient of Ronnie Doss M, DO. I reached out to [name] by phone today in response to a referral sent by Derek Ryan PCP, Janora Norlander, DO      Derek Ryan was given information about Chronic Care Management services today including:  CCM service includes personalized support from designated clinical staff supervised by his physician, including individualized plan of care and coordination with other care providers 24/7 contact phone numbers for assistance for urgent and routine care needs. Service will only be billed when office clinical staff spend 20 minutes or more in a month to coordinate care. Only one practitioner may furnish and bill the service in a calendar month. The patient may stop CCM services at any time (effective at the end of the month) by phone call to the office staff. The patient will be responsible for cost sharing (co-pay) of up to 20% of the service fee (after annual deductible is met).  Patient did not agree to enrollment in care management services and does not wish to consider at this time.  Follow up plan: Patient declines further follow up and engagement by the care management team. Appropriate care team members and provider have been notified via electronic communication.   Noreene Larsson, [place], [place], [place] [address] Direct Dial: [phone] [email or web address] Website: [email or web address]

## 2021-05-11 ENCOUNTER — Other Ambulatory Visit: Payer: Self-pay | Admitting: Family Medicine

## 2021-05-11 DIAGNOSIS — F411 Generalized anxiety disorder: Secondary | ICD-10-CM

## 2021-05-11 DIAGNOSIS — F321 Major depressive disorder, single episode, moderate: Secondary | ICD-10-CM

## 2021-05-12 ENCOUNTER — Other Ambulatory Visit: Payer: Self-pay | Admitting: Family Medicine

## 2021-05-12 DIAGNOSIS — E785 Hyperlipidemia, unspecified: Secondary | ICD-10-CM

## 2021-05-21 ENCOUNTER — Ambulatory Visit: Payer: Medicare Other | Admitting: Cardiology

## 2021-05-21 ENCOUNTER — Other Ambulatory Visit: Payer: Self-pay

## 2021-05-21 ENCOUNTER — Encounter: Payer: Self-pay | Admitting: Cardiology

## 2021-05-21 VITALS — BP 123/67 | HR 66 | Temp 98.1°F | Resp 17 | Ht 68.0 in | Wt 237.0 lb

## 2021-05-21 DIAGNOSIS — H349 Unspecified retinal vascular occlusion: Secondary | ICD-10-CM | POA: Diagnosis not present

## 2021-05-21 DIAGNOSIS — I10 Essential (primary) hypertension: Secondary | ICD-10-CM

## 2021-05-21 DIAGNOSIS — E7800 Pure hypercholesterolemia, unspecified: Secondary | ICD-10-CM | POA: Diagnosis not present

## 2021-05-21 DIAGNOSIS — R0789 Other chest pain: Secondary | ICD-10-CM | POA: Diagnosis not present

## 2021-05-21 DIAGNOSIS — R002 Palpitations: Secondary | ICD-10-CM | POA: Diagnosis not present

## 2021-05-21 NOTE — Progress Notes (Signed)
Primary Physician/Referring:  Janora Norlander, DO  Patient ID: Derek Ryan, male    DOB: 1956-08-11, 65 y.o.   MRN: [id number]  Chief Complaint  Patient presents with   Follow-up    6 MONTHS   Hypertension   HPI:    Derek Ryan  is a 65 y.o.  Caucasian male with hypertension, hyperlipidemia, hyperglycemia, sleep apnea (noncompliant with CPAP), anxiety and depression.  History of COVID-19 pneumonia 05/2019.  Admitted to the hospital on 03/30/2020 and discharged on 04/02/2020 with left visual loss associated with headache and left facial paresthesia and diagnosed with central retinal artery occlusion and underwent extensive evaluation including outpatient extended telemetry.  No etiology was found.  He was also evaluated by neurology and it was agreed upon that he would benefit from loop recorder implantation but patient eventually decided for continued risk factor modification and observation for now.  Patient presents for 67-month follow-up.  He has had no recurrence of chest pain since last office visit.  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  However he does not have a formal exercise routine.  Patient monitors blood pressure on a regular basis at home and reports readings which are well controlled.  His primary concern is that he has had worsening palpitations occurring approximately 2 times weekly at night lasting approximately 1 minute.  Patient denies associated symptoms.  Denies dyspnea, syncope, near syncope, dizziness.  Denies orthopnea, PND, leg edema.  Patient admits he is noncompliant with CPAP use or diet.  Past Medical History:  Diagnosis Date   Central retinal artery occlusion of left eye    Chronic diastolic heart failure ([place]) 08/25/2019   Depression    GERD (gastroesophageal reflux disease)    Hyperlipidemia    Hypertension    LVH (left ventricular hypertrophy) due to hypertensive disease 08/25/2019    OSA (obstructive sleep apnea)    unable to tolerate CPAP   Family History  Problem Relation Age of Onset   Heart attack Mother    Alcohol abuse Father    Stomach cancer Sister    Hypertension Brother    Heart attack Brother    Brain cancer Brother    Heart attack Nephew 49       deceased from massive MI   Past Surgical History:  Procedure Laterality Date   COLONOSCOPY     Social History   Tobacco Use   Smoking status: Former    Packs/day: 1.00    Years: 23.00    Pack years: 23.00    Types: Cigarettes    Quit date: 1992    Years since quitting: 30.7   Smokeless tobacco: Never  Substance Use Topics   Alcohol use: Not Currently    Comment: stopped in 1997   Marital Status: Widowed   ROS  Review of Systems  Constitutional: Negative for malaise/fatigue and weight gain.  Cardiovascular:  Positive for dyspnea on exertion and palpitations. Negative for chest pain, claudication, leg swelling, near-syncope, orthopnea, paroxysmal nocturnal dyspnea and syncope.  Respiratory:  Positive for sleep disturbances due to breathing and snoring. Negative for shortness of breath.   Neurological:  Negative for dizziness.  All other systems reviewed and are negative. Objective   Vitals with BMI 05/21/2021 04/24/2021 04/20/2021  Height 5\' 8"  5\' 8"  5\' 8"   Weight 237 lbs 235 lbs 235 lbs 6 oz  BMI 36.04 96.28 36.6  Systolic 294 - 765  Diastolic 67 - 65  Pulse 66 - 66   Blood pressure 123/67, pulse 66, temperature 98.1 F (36.7 C), temperature source Temporal, resp. rate 17, height 5\' 8"  (1.727 m), weight 237 lb (107.5 kg), SpO2 97 %.   Physical Exam Vitals reviewed.  HENT:     Head: Normocephalic and atraumatic.  Cardiovascular:     Rate and Rhythm: Normal rate and regular rhythm.     Pulses: Intact distal pulses.          Carotid pulses are 2+ on the right side and 2+ on the left side.      Radial pulses are 2+ on the right side and 2+ on the left side.       Popliteal pulses are 2+  on the right side and 2+ on the left side.       Dorsalis pedis pulses are 1+ on the right side and 1+ on the left side.       Posterior tibial pulses are 1+ on the right side and 1+ on the left side.     Heart sounds: S1 normal and S2 normal. No murmur heard.   No gallop.  Pulmonary:     Effort: Pulmonary effort is normal. No respiratory distress.     Breath sounds: No wheezing, rhonchi or rales.  Musculoskeletal:     Right lower leg: No edema.     Left lower leg: No edema.  Neurological:     Mental Status: He is alert.   Laboratory examination:   CMP Latest Ref Rng & Units 04/20/2021 10/26/2020 03/31/2020  Glucose 65 - 99 mg/dL 89 120(H) 119(H)  BUN 8 - 27 mg/dL 13 15 19   Creatinine 0.76 - 1.27 mg/dL 0.93 0.85 0.88  Sodium 134 - 144 mmol/L 138 146(H) 138  Potassium 3.5 - 5.2 mmol/L 4.2 4.5 3.7  Chloride 96 - 106 mmol/L 97 103 100  CO2 20 - 29 mmol/L 24 24 29   Calcium 8.6 - 10.2 mg/dL 9.6 9.5 9.3  Total Protein 6.0 - 8.5 g/dL - 7.2 7.0  Total Bilirubin 0.0 - 1.2 mg/dL - 0.8 1.0  Alkaline Phos 44 - 121 IU/L - 91 63  AST 0 - 40 IU/L - 19 22  ALT 0 - 44 IU/L - 22 24   CBC Latest Ref Rng & Units 10/26/2020 03/31/2020 03/30/2020  WBC 3.4 - 10.8 x10E3/uL 7.0 7.9 -  Hemoglobin 13.0 - 17.7 g/dL 14.3 13.8 14.3  Hematocrit 37.5 - 51.0 % 42.0 41.1 42.0  Platelets 150 - 450 x10E3/uL 273 247 -   Lipid Panel No results for input(s): CHOL, TRIG, LDLCALC, VLDL, HDL, CHOLHDL, LDLDIRECT in the last 8760 hours.   HEMOGLOBIN A1C Lab Results  Component Value Date   HGBA1C 6.2 04/20/2021   MPG 139.85 03/31/2020   TSH No results for input(s): TSH in the last 8760 hours.  External Labs:  04/01/2020: HDL 37, LDL 68, total cholesterol 122, triglycerides 77  Allergies   Allergies  Allergen Reactions   Doxycycline Itching    As of 03/31/20, pt does not recall this reaction    Medications Prior to Visit:   Outpatient Medications Prior to Visit  Medication Sig Dispense Refill   aspirin EC  81 MG tablet Take 81 mg by mouth daily.     atorvastatin (LIPITOR) 10 MG tablet TAKE 1 TABLET BY MOUTH EVERYDAY AT BEDTIME 90 tablet 1   busPIRone (BUSPAR) 10 MG tablet TAKE 1 TABLET (10 MG TOTAL) BY MOUTH 3 (THREE)  TIMES DAILY. FOR ANXIETY 270 tablet 1   cholecalciferol (VITAMIN D3) 25 MCG (1000 UT) tablet Take 2,000 Units by mouth daily.     escitalopram (LEXAPRO) 20 MG tablet TAKE 1 TABLET BY MOUTH EVERY DAY 90 tablet 1   losartan-hydrochlorothiazide (HYZAAR) 50-12.5 MG tablet TAKE 1 TABLET BY MOUTH EVERY DAY 90 tablet 2   nitroGLYCERIN (NITROSTAT) 0.4 MG SL tablet Place 1 tablet (0.4 mg total) under the tongue every 5 (five) minutes as needed for chest pain. 30 tablet 3   Omega-3 Fatty Acids (FISH OIL) 1000 MG CAPS Take by mouth.     vitamin C (ASCORBIC ACID) 500 MG tablet Take 500 mg by mouth daily.     No facility-administered medications prior to visit.   Final Medications at End of Visit    Current Meds  Medication Sig   aspirin EC 81 MG tablet Take 81 mg by mouth daily.   atorvastatin (LIPITOR) 10 MG tablet TAKE 1 TABLET BY MOUTH EVERYDAY AT BEDTIME   busPIRone (BUSPAR) 10 MG tablet TAKE 1 TABLET (10 MG TOTAL) BY MOUTH 3 (THREE) TIMES DAILY. FOR ANXIETY   cholecalciferol (VITAMIN D3) 25 MCG (1000 UT) tablet Take 2,000 Units by mouth daily.   escitalopram (LEXAPRO) 20 MG tablet TAKE 1 TABLET BY MOUTH EVERY DAY   losartan-hydrochlorothiazide (HYZAAR) 50-12.5 MG tablet TAKE 1 TABLET BY MOUTH EVERY DAY   nitroGLYCERIN (NITROSTAT) 0.4 MG SL tablet Place 1 tablet (0.4 mg total) under the tongue every 5 (five) minutes as needed for chest pain.   Omega-3 Fatty Acids (FISH OIL) 1000 MG CAPS Take by mouth.   vitamin C (ASCORBIC ACID) 500 MG tablet Take 500 mg by mouth daily.     Radiology:   CTA chest 08/30/2019: 1. No CT findings for pulmonary embolism. 2. Atherosclerotic calcifications involving the thoracic aorta and coronary arteries but no aneurysm or dissection. 3. Two adjacent  right lower lobe pulmonary nodules are indeterminate but with history of smoking and emphysematous changes I would recommend a follow-up noncontrast chest CT in 6-12 months. There is also a borderline right infrahilar lymph node which can be re-examined at that time. 4. Calcified granulomas involving the chest and abdomen.  MR angio head without contrast 03/30/2020: 1. No acute intracranial abnormality. 2. Mild age-related cerebral atrophy with chronic small vessel ischemic disease.  Cardiac Studies:   Lexiscan Tetrofosmin stress test 07/12/2019: No previous exam available for comparison. Myocardial perfusion imaging is normal. Left ventricular ejection fraction is 50% with normal wall motion. Low risk study.  Cardiac MR with and without contrast 07/06/2019: 1. Mildly dilated left ventricle with moderate concentric left ventricular hypertrophy and normal left ventricular systolic function (LVEF = 60%). There are no regional wall motion abnormalities. These findings are not consistent with an infiltrative cardiomyopathy. T1 mapping showed normal extracellular volume (ECV) of 22% (normal < 25%). 2.  Normal RV thickness and systolic function. 3.  Moderately dilated left and right atrial size. 4.  Normal aortic root, ascending aorta and pulmonary artery.   5.  Moderate mitral and tricuspid regurgitation. 6.  Normal pericardium.  Carotid artery duplex 03/31/2020: Right Carotid: Velocities in the right ICA are consistent with a 1-39% stenosis.  Left Carotid: Velocities in the left ICA are consistent with a 1-39% stenosis.  Vertebrals: Bilateral vertebral arteries demonstrate antegrade flow.  Echocardiogram 03/31/2020: 1. Left ventricular ejection fraction, by estimation, is 60 to 65%. The left ventricle has normal function. The left ventricle has no regional wall motion abnormalities. Left  ventricular diastolic parameters are consistent with Grade I diastolic  dysfunction (impaired  relaxation).  2. Right ventricular systolic function is normal. The right ventricular size is normal.  3. The mitral valve is normal in structure. No evidence of mitral valve regurgitation. No evidence of mitral stenosis.  4. The aortic valve is normal in structure. Aortic valve regurgitation is not visualized. No aortic stenosis is present.  5. The inferior vena cava is normal in size with greater than 50% respiratory variability, suggesting right atrial pressure of 3 mmHg.  Zio Patch Extended out patient monitoring 14 days 04/07/2020 to 04/21/2020:  Predominant rhythm is sinus rhythm.  2 SVT runs occurred, fastest 4 beats at rate 185 and longest 6 beats at 138 bpm.  Occasional PVCs and PACs.  No patient activated events.  EKG:   05/21/2021: Sinus rhythm with borderline first-degree AV block at a rate of 63 bpm.  Left atrial enlargement.  Right bundle branch block.  Nonspecific T wave abnormality.    10/26/2020: Normal sinus rhythm at rate of 62 bpm, normal axis.  Right bundle branch block.  Low-voltage complexes.  No evidence of ischemia. EKG 02/23/2020: Normal sinus rhythm at rate of 60 bpm, normal axis, right bundle branch block.  No evidence of ischemia.  No significant change from 08/24/2018.  Assessment     ICD-10-CM   1. Essential hypertension  I10 EKG 12-Lead    2. Atypical chest pain  R07.89     3. Hypercholesterolemia  E78.00 Lipid Panel With LDL/HDL Ratio    4. Retinal artery occlusion : left eye  H34.9     5. Palpitations  R00.2       Recommendations:   Derek Ryan  is a 65 y.o. Caucasian male with hypertension, hyperlipidemia, hyperglycemia, sleep apnea (noncompliant with CPAP), anxiety and depression.  History of COVID-19 pneumonia 05/2019.  Admitted to the hospital on 03/30/2020 and discharged on 04/02/2020 with left visual loss associated with headache and left facial paresthesia and diagnosed with central retinal artery occlusion and underwent extensive evaluation including  outpatient extended telemetry.  No etiology was found.  He was also evaluated by neurology and it was agreed upon that he would benefit from loop recorder implantation but patient eventually decided for continued risk factor modification.   Patient presents for 43-month follow-up.  His primary concern today is more frequent palpitations occurring at night and lasting less than 1 minute.  Patient's symptoms are suggestive of SVT which was noted on previous cardiac monitor.  However given worsening symptoms of palpitations and history of central retinal artery occlusion shared decision was to proceed with loop recorder implantation at this time.  Patient is also due for lipid profile testing, which I have ordered at this time.  Discussed at length with patient regarding the importance of CPAP compliance, or following up with sleep provider for further treatment of OSA.  Also discussed with patient regarding diet and lifestyle modifications, particularly weight loss.  Blood pressure is well controlled.  Follow-up after loop recorder implantation.   Alethia Berthold, PA-C 05/21/2021, 4:04 PM Office: [phone]

## 2021-05-22 ENCOUNTER — Ambulatory Visit (HOSPITAL_COMMUNITY)
Admission: RE | Admit: 2021-05-22 | Discharge: 2021-05-22 | Disposition: A | Discharge status: Home / Self Care | Payer: Medicare Other | Source: Ambulatory Visit | Attending: Family Medicine | Admitting: Family Medicine

## 2021-05-22 ENCOUNTER — Other Ambulatory Visit: Payer: Medicare Other

## 2021-05-22 DIAGNOSIS — R911 Solitary pulmonary nodule: Secondary | ICD-10-CM | POA: Diagnosis not present

## 2021-05-22 DIAGNOSIS — E7800 Pure hypercholesterolemia, unspecified: Secondary | ICD-10-CM | POA: Diagnosis not present

## 2021-05-22 DIAGNOSIS — R918 Other nonspecific abnormal finding of lung field: Secondary | ICD-10-CM | POA: Insufficient documentation

## 2021-05-22 DIAGNOSIS — I700 Atherosclerosis of aorta: Secondary | ICD-10-CM | POA: Diagnosis not present

## 2021-05-22 DIAGNOSIS — Z87891 Personal history of nicotine dependence: Secondary | ICD-10-CM | POA: Diagnosis not present

## 2021-05-22 IMAGING — CT CT CHEST W/O CM
2 of 4 series · 15 of 36 positions shown, 18 images · non-contrast
Comparison: [DATE]

CLINICAL DATA: Follow-up lung nodules

EXAM:
CT CHEST WITHOUT CONTRAST
TECHNIQUE: Multidetector CT imaging of the chest was performed following the
standard protocol without IV contrast.

[Series 2: routine chest without · axial · non-contrast · 0.73mm/px · z∈[+1256,+1520]mm · 12 of 157 slices shown, 15 images]
[im 13/157  mediastinal]
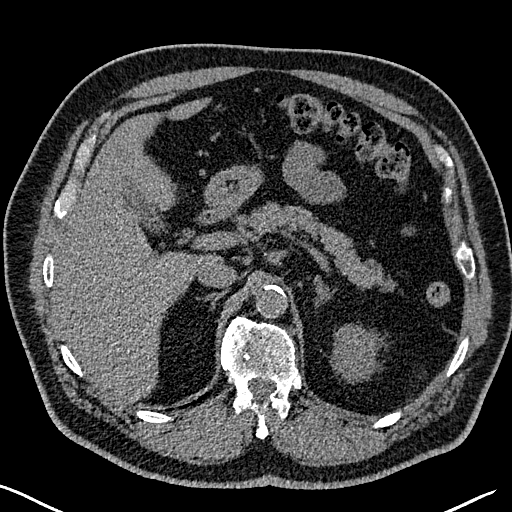
[im 13/157  lung]
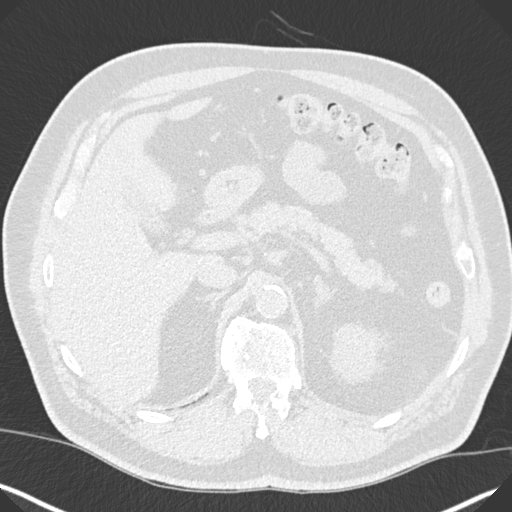
[im 25/157  lung]
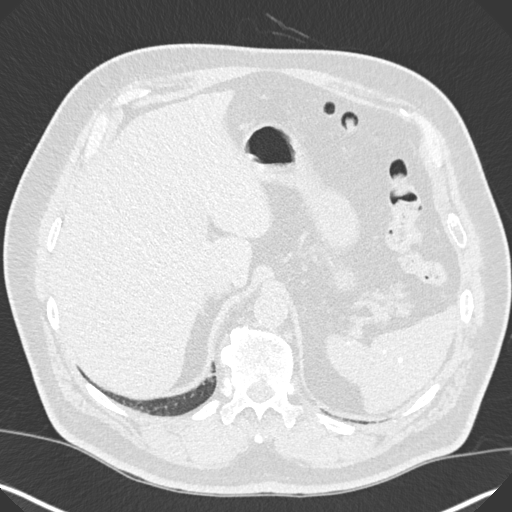
[im 37/157  lung]
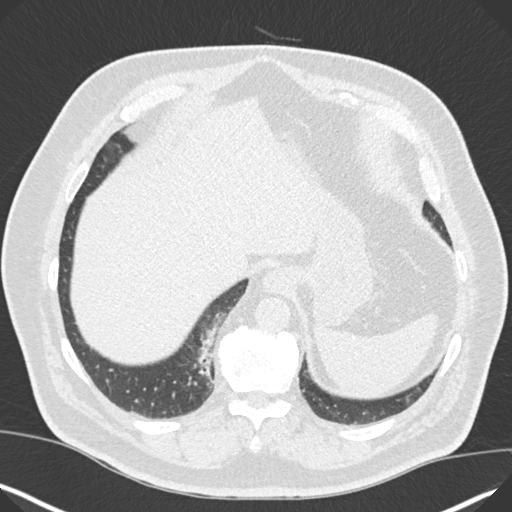
[im 49/157  lung]
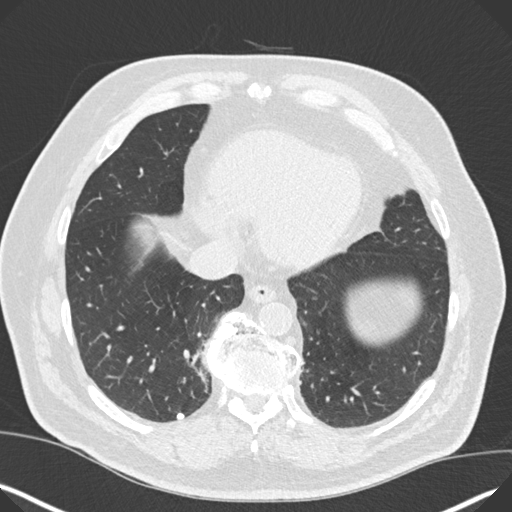
[im 61/157  mediastinal]
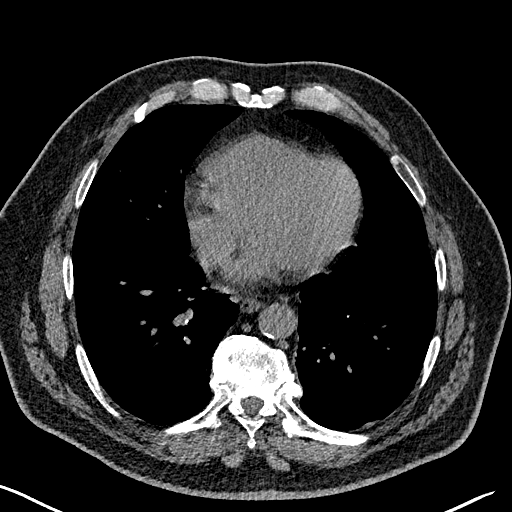
[im 61/157  lung]
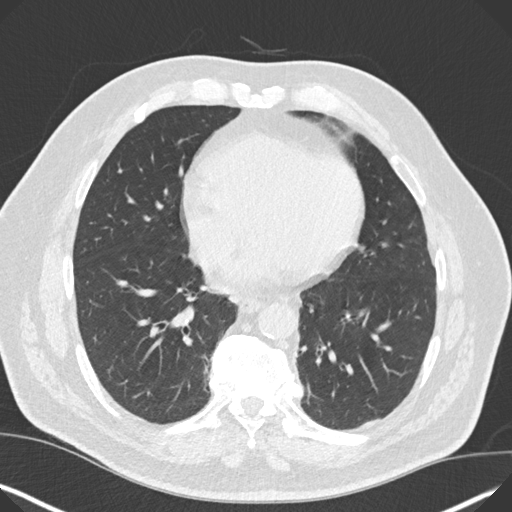
[im 73/157  lung]
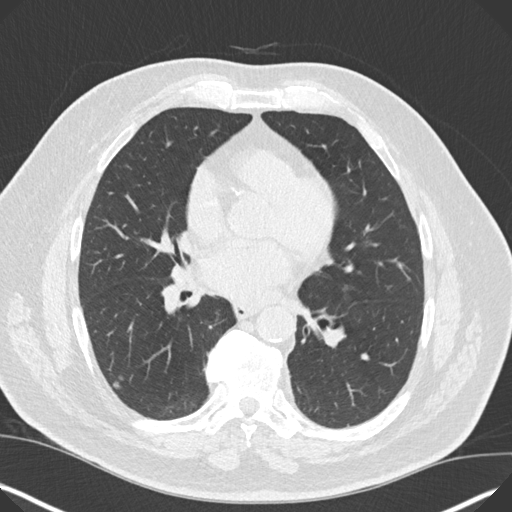
[im 85/157  lung]
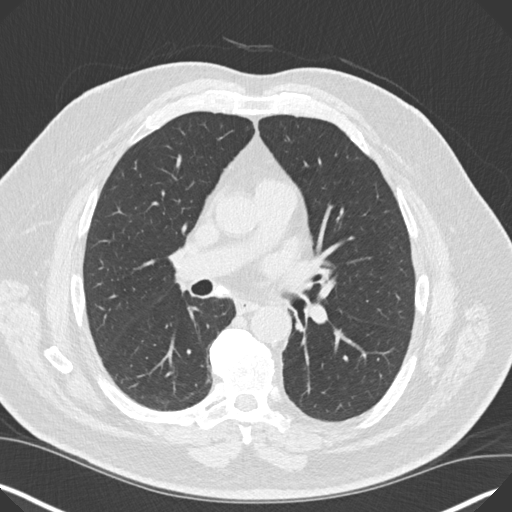
[im 97/157  lung]
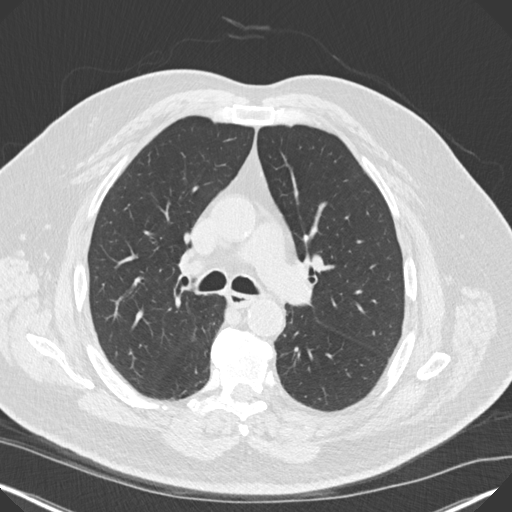
[im 109/157  mediastinal]
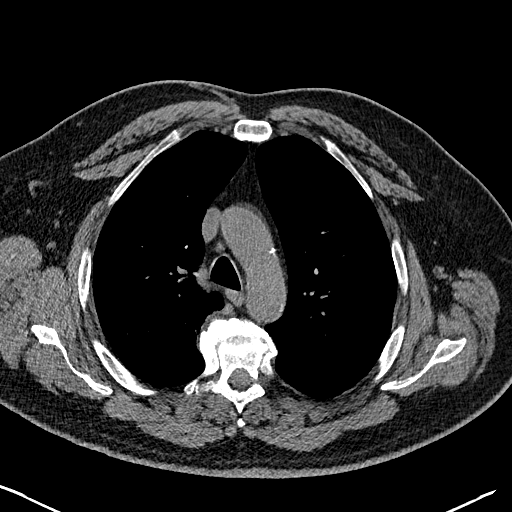
[im 109/157  lung]
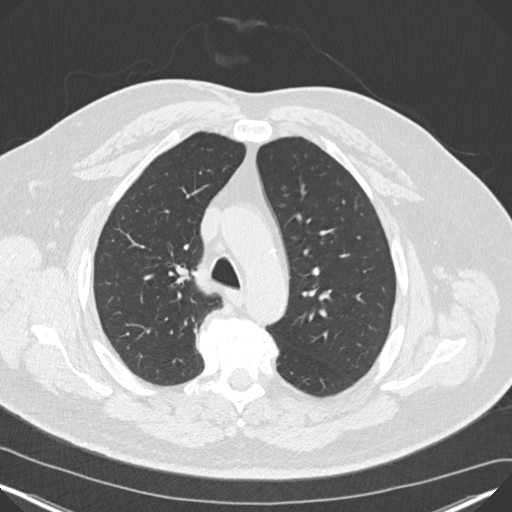
[im 121/157  lung]
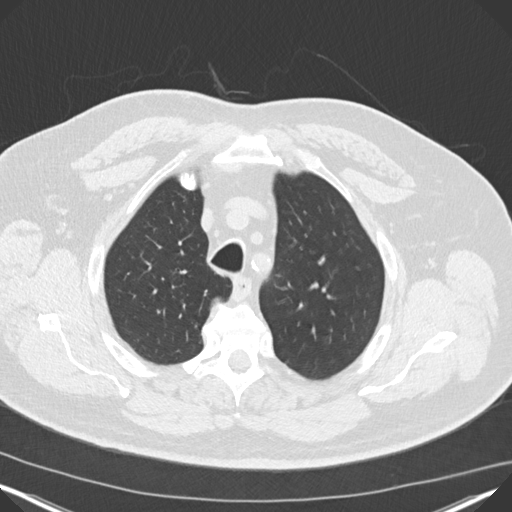
[im 133/157  lung]
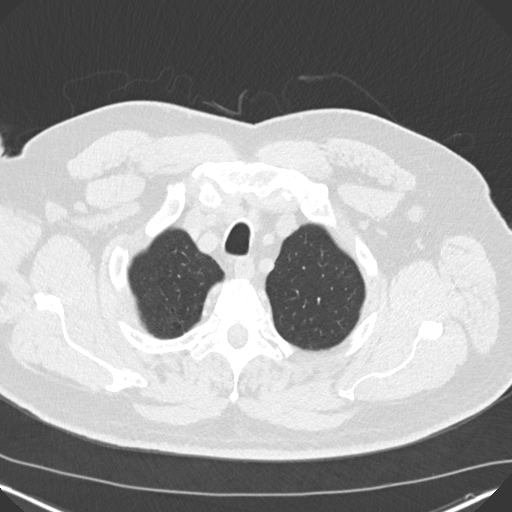
[im 145/157  lung]
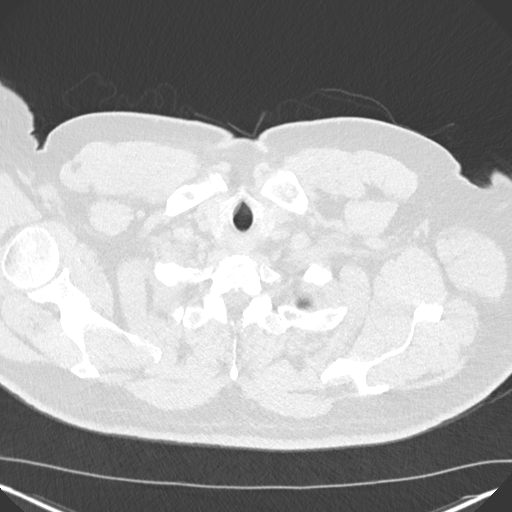

[Series 5: coronal · coronal · 0.63mm/px · 3 of 151 slices shown]
[im 31/151  lung]
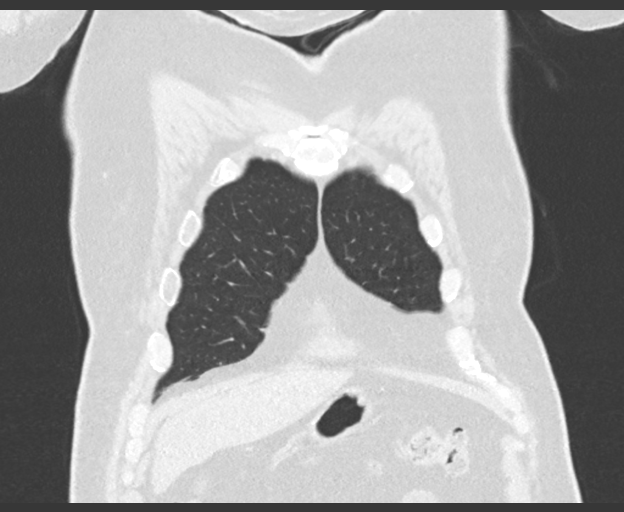
[im 61/151  lung]
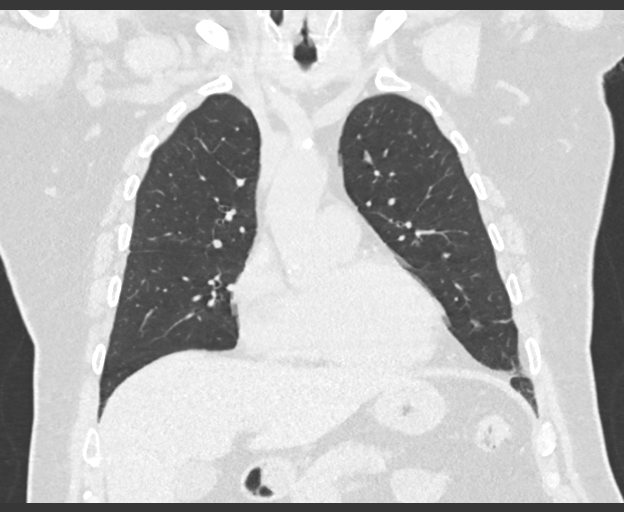
[im 91/151  lung]
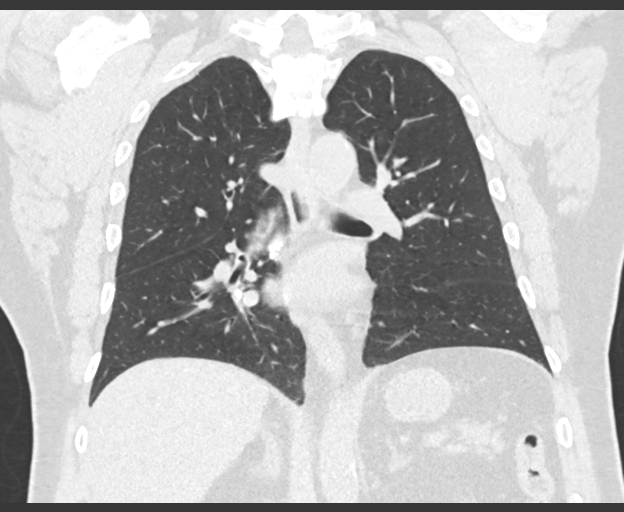

[15 of 36 positions shown; findings below may reference images not displayed]

FINDINGS: Cardiovascular: Somewhat limited due to lack of IV contrast.
Atherosclerotic calcifications are noted. No aneurysmal dilatation
is seen. Coronary calcifications are noted. No cardiac enlargement
is seen.

Mediastinum/Nodes: Thoracic inlet is within normal limits. Calcified
subcarinal lymph nodes are seen consistent with prior granulomatous
disease. Right infrahilar lymph node is noted similar to that seen
on the prior exam but slightly decreased in size now measuring 7 mm.
No other sizable adenopathy is noted. The esophagus as visualized is
within normal limits.

Lungs/Pleura: Lungs are well aerated bilaterally. Two adjacent right
lower lobe pulmonary nodules are noted best seen on image number 84
of series 4. The largest of these measures 6 mm but the overall
appearance is stable. No other pulmonary nodules are seen. Minimal
scarring in the medial right lung base is noted. No effusion or
focal infiltrate is seen.

Upper Abdomen: Visualized upper abdomen demonstrates multiple
calcified splenic granulomas. No other focal abnormality is seen.

Musculoskeletal: Degenerative changes of the thoracic spine are
noted. No acute rib fracture is seen.
IMPRESSION: Stable pulmonary nodules in the right lower lobe laterally no new
nodularity is seen. Given their stability at 21 months there felt to
be benign in etiology. No further follow-up is recommended.

Changes of prior granulomatous disease.

Previously seen right infrahilar lymph node has regressed in size
when compared with the prior exam.

No acute abnormality noted.

Aortic Atherosclerosis ([0S]-[0S]).

## 2021-05-23 LAB — LIPID PANEL WITH LDL/HDL RATIO
Cholesterol, Total: 125 mg/dL (ref 100–199)
HDL: 38 mg/dL — ABNORMAL LOW (ref >39)
LDL Chol Calc (NIH): 69 mg/dL (ref 0–99)
LDL/HDL Ratio: 1.8 ratio (ref 0.0–3.6)
Triglycerides: 92 mg/dL (ref 0–149)
VLDL Cholesterol Cal: 18 mg/dL (ref 5–40)

## 2021-05-23 NOTE — Progress Notes (Signed)
Seeing you 05/25/2021.

## 2021-05-25 ENCOUNTER — Ambulatory Visit: Payer: Medicare Other | Admitting: Cardiology

## 2021-05-25 ENCOUNTER — Encounter: Payer: Self-pay | Admitting: Cardiology

## 2021-05-25 ENCOUNTER — Other Ambulatory Visit: Payer: Self-pay

## 2021-05-25 VITALS — BP 116/67 | HR 70 | Temp 98.4°F | Resp 16 | Ht 68.0 in | Wt 239.0 lb

## 2021-05-25 DIAGNOSIS — Z95818 Presence of other cardiac implants and grafts: Secondary | ICD-10-CM

## 2021-05-25 DIAGNOSIS — I639 Cerebral infarction, unspecified: Secondary | ICD-10-CM | POA: Diagnosis not present

## 2021-05-25 DIAGNOSIS — G4733 Obstructive sleep apnea (adult) (pediatric): Secondary | ICD-10-CM | POA: Diagnosis not present

## 2021-05-25 DIAGNOSIS — H3412 Central retinal artery occlusion, left eye: Secondary | ICD-10-CM

## 2021-05-25 DIAGNOSIS — I498 Other specified cardiac arrhythmias: Secondary | ICD-10-CM | POA: Diagnosis not present

## 2021-05-25 NOTE — Patient Instructions (Signed)
Please do not bathe for 1 week but he can shower.  He can shower Sunday evening, after shower take the bandage off, dry off any redness and place a simple Band-Aid.  Please contact us if you see any redness or any discharge from the site of implantation.

## 2021-05-25 NOTE — Progress Notes (Addendum)
Chief Complaint  Patient presents with   Loop recorder implantation    For cryptogenic stroke, arrhythmia.   Patient has been evaluated by neurology, he was recommended to have loop recorder implantation to evaluate for central retinal artery occlusion in view of underlying obstructive sleep apnea, diastolic heart failure and mild obesity has risk factors.  Prodedure performed: Insertion of Biotronik Loop Recorder. Serial [id number]  Indication: Cryptogenic stroke, other arrhythmias.  Blood pressure 116/67, pulse 70, temperature 98.4 F (36.9 C), temperature source Temporal, resp. rate 16, height 5\' 8"  (1.727 m), weight 239 lb (108.4 kg), SpO2 94 %.   After obtaining informed consent, explaining the procedure to the patient, under sterile precautions, local anesthesia with 1% lidocaine with epinephrine was injected subcutaneously in the left parasternal region.  20 mL utilized.  A small nick was made in the left paraspinal region at the 3rd intercostal space. Biotronik loop recorder was inserted without any complications.  Patient tolerated the procedure well.  The incision was closed with 2 4-0 silk sutures.  There is no blood loss, no hematoma.  Written instrtuctions regarding wound care given to the patient.  Device setting:  R wave amplitude 0.5 mV.   Programmed:  AF detection to Medium, detection termination window  16/24) along with 9 detection intervals. HVR 180/min. With counter of 48 Bradycardia 35 BPM Sudden rate drop 50 bpm Asystole 5 Sec Patient triggered: Off    ICD-10-CM   1. CRAO (central retinal artery occlusion), left 03/30/2020  H34.12     2. Other cardiac arrhythmia  I49.8     3. OSA (obstructive sleep apnea)  G47.33     4. Loop recorder Biotronik 05/25/2021 for cryptogenic stroke  Z95.818     5. Cryptogenic stroke [facility])  I63.9          Adrian Prows, MD, [facility] 05/26/2021, 8:43 AM Office: [phone] Fax: [phone] Pager: [phone]

## 2021-05-26 ENCOUNTER — Other Ambulatory Visit: Payer: Self-pay | Admitting: Family Medicine

## 2021-05-26 ENCOUNTER — Encounter: Payer: Self-pay | Admitting: Cardiology

## 2021-05-26 DIAGNOSIS — F411 Generalized anxiety disorder: Secondary | ICD-10-CM

## 2021-05-26 DIAGNOSIS — I639 Cerebral infarction, unspecified: Secondary | ICD-10-CM

## 2021-05-26 DIAGNOSIS — Z95818 Presence of other cardiac implants and grafts: Secondary | ICD-10-CM

## 2021-05-26 HISTORY — DX: Presence of other cardiac implants and grafts: Z95.818

## 2021-05-26 HISTORY — DX: Cerebral infarction, unspecified: I63.9

## 2021-05-28 ENCOUNTER — Other Ambulatory Visit: Payer: Self-pay | Admitting: Family Medicine

## 2021-05-28 DIAGNOSIS — F411 Generalized anxiety disorder: Secondary | ICD-10-CM

## 2021-05-31 NOTE — Progress Notes (Signed)
Patient here for suture removal. Patient underwent loop recorder implantation 7 days ago, which required closure with 2 sutures. Patient denies pain, redness, or drainage from the wound. The area was cleaned, suture was removed using aseptic technique, and implantation site was cleaned again. Informed patient to monitor the area for signs of infection and to let the office know if these develop, they expressed understanding.    Alethia Berthold, PA-C 06/01/2021, 9:33 AM Office: [phone]

## 2021-06-01 ENCOUNTER — Ambulatory Visit: Payer: Medicare Other | Admitting: Student

## 2021-06-01 ENCOUNTER — Other Ambulatory Visit: Payer: Self-pay

## 2021-06-01 DIAGNOSIS — Z95818 Presence of other cardiac implants and grafts: Secondary | ICD-10-CM

## 2021-06-06 ENCOUNTER — Other Ambulatory Visit: Payer: Self-pay

## 2021-06-06 ENCOUNTER — Telehealth: Payer: Self-pay

## 2021-06-06 ENCOUNTER — Ambulatory Visit: Payer: Medicare Other | Admitting: Student

## 2021-06-06 ENCOUNTER — Encounter: Payer: Self-pay | Admitting: Student

## 2021-06-06 VITALS — BP 118/66 | HR 65 | Temp 98.4°F | Resp 16 | Ht 68.0 in | Wt 238.6 lb

## 2021-06-06 DIAGNOSIS — Z95818 Presence of other cardiac implants and grafts: Secondary | ICD-10-CM | POA: Diagnosis not present

## 2021-06-06 DIAGNOSIS — R0789 Other chest pain: Secondary | ICD-10-CM

## 2021-06-06 NOTE — Telephone Encounter (Signed)
Continue to monitor, I reviewed his loop recorder, no significant abnormalities noted.

## 2021-06-06 NOTE — Progress Notes (Signed)
Primary Physician/Referring:  Janora Norlander, DO  Patient ID: Derek Ryan, male    DOB: 1955/08/24, 65 y.o.   MRN: [id number]  Chief Complaint  Patient presents with  . Chest Pain   HPI:    Derek Ryan  is a 65 y.o.  Caucasian male with hypertension, hyperlipidemia, hyperglycemia, sleep apnea (noncompliant with CPAP), anxiety and depression.  History of COVID-19 pneumonia 05/2019.  Admitted to the hospital on 03/30/2020 and discharged on 04/02/2020 with left visual loss associated with headache and left facial paresthesia and diagnosed with central retinal artery occlusion and underwent extensive evaluation including outpatient extended telemetry.  No etiology was found.  He was also evaluated by neurology and it was agreed upon that he would benefit from loop recorder implantation, subsequently underwent loop recorder implantation 05/25/2021.  Patient presents for urgent visit today with concerns of chest pain.  He states he woke up with chest discomfort at approximately 4:30 this morning with associated mild diaphoresis.  Patient states he got out of bed and chest discomfort resolved approximately 10 minutes later.  He did not take nitroglycerin, symptoms resolved spontaneously.  Denies dyspnea, syncope, near syncope, dizziness.  Denies orthopnea, PND, leg edema.  Past Medical History:  Diagnosis Date  . Central retinal artery occlusion of left eye   . Chronic diastolic heart failure ([place]) 08/25/2019  . Cryptogenic stroke ([place]) 05/26/2021  . Depression   . GERD (gastroesophageal reflux disease)   . Hyperlipidemia   . Hypertension   . Loop recorder Biomonitor-Biotronik 05/25/2021 05/26/2021  . 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 OSA (obstructive sleep apnea)    unable to tolerate CPAP  . Retinal artery branch occlusion, 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   Family History  Problem Relation Age of Onset  . Heart attack Mother   . Alcohol abuse Father    . Stomach cancer Sister   . Hypertension Brother   . Heart attack Brother   . Brain cancer Brother   . Heart attack Nephew 49       deceased from massive MI   Past Surgical History:  Procedure Laterality Date  . COLONOSCOPY     Social History   Tobacco Use  . Smoking status: Former    Packs/day: 1.00    Years: 23.00    Pack years: 23.00    Types: Cigarettes    Quit date: 1992    Years since quitting: 30.8  . Smokeless tobacco: Never  Substance Use Topics  . Alcohol use: Not Currently    Comment: stopped in 1997   Marital Status: Widowed   ROS  Review of Systems  Constitutional: Negative for malaise/fatigue and weight gain.  Cardiovascular:  Positive for chest pain. Negative for claudication, leg swelling, near-syncope, orthopnea, palpitations, paroxysmal nocturnal dyspnea and syncope.  Respiratory:  Positive for sleep disturbances due to breathing and snoring. Negative for shortness of breath.   Neurological:  Negative for dizziness.  All other systems reviewed and are negative. Objective   Vitals with BMI 06/06/2021 05/25/2021 05/21/2021  Height 5\' 8"  5\' 8"  5\' 8"   Weight 238 lbs 10 oz 239 lbs 237 lbs  BMI 36.29 01.02 72.53  Systolic 664 403 474  Diastolic 66 67 67  Pulse 65 70 66   Blood pressure 118/66, pulse 65, temperature 98.4 F (36.9 C), temperature source Temporal, resp. rate 16, height 5\' 8"  (1.727 m), weight 238 lb 9.6 oz (108.2 kg), SpO2 93 %.   Physical Exam Vitals  reviewed.  HENT:     Head: Normocephalic and atraumatic.  Cardiovascular:     Rate and Rhythm: Normal rate and regular rhythm.     Pulses: Intact distal pulses.          Carotid pulses are 2+ on the right side and 2+ on the left side.      Radial pulses are 2+ on the right side and 2+ on the left side.       Popliteal pulses are 2+ on the right side and 2+ on the left side.       Dorsalis pedis pulses are 1+ on the right side and 1+ on the left side.       Posterior tibial pulses are 1+  on the right side and 1+ on the left side.     Heart sounds: S1 normal and S2 normal. No murmur heard.   No gallop.  Pulmonary:     Effort: Pulmonary effort is normal. No respiratory distress.     Breath sounds: No wheezing, rhonchi or rales.  Musculoskeletal:     Right lower leg: No edema.     Left lower leg: No edema.  Skin:    Comments: Loop recorder implantation site well-healed without evidence of ecchymosis, erythema, drainage, swelling.  Neurological:     Mental Status: He is alert.    Laboratory examination:   CMP Latest Ref Rng & Units 04/20/2021 10/26/2020 03/31/2020  Glucose 65 - 99 mg/dL 89 120(H) 119(H)  BUN 8 - 27 mg/dL 13 15 19   Creatinine 0.76 - 1.27 mg/dL 0.93 0.85 0.88  Sodium 134 - 144 mmol/L 138 146(H) 138  Potassium 3.5 - 5.2 mmol/L 4.2 4.5 3.7  Chloride 96 - 106 mmol/L 97 103 100  CO2 20 - 29 mmol/L 24 24 29   Calcium 8.6 - 10.2 mg/dL 9.6 9.5 9.3  Total Protein 6.0 - 8.5 g/dL - 7.2 7.0  Total Bilirubin 0.0 - 1.2 mg/dL - 0.8 1.0  Alkaline Phos 44 - 121 IU/L - 91 63  AST 0 - 40 IU/L - 19 22  ALT 0 - 44 IU/L - 22 24   CBC Latest Ref Rng & Units 10/26/2020 03/31/2020 03/30/2020  WBC 3.4 - 10.8 x10E3/uL 7.0 7.9 -  Hemoglobin 13.0 - 17.7 g/dL 14.3 13.8 14.3  Hematocrit 37.5 - 51.0 % 42.0 41.1 42.0  Platelets 150 - 450 x10E3/uL 273 247 -   Lipid Panel Recent Labs    05/22/21 0922  CHOL 125  TRIG 92  LDLCALC 69  HDL 38*     HEMOGLOBIN A1C Lab Results  Component Value Date   HGBA1C 6.2 04/20/2021   MPG 139.85 03/31/2020   TSH No results for input(s): TSH in the last 8760 hours.  External Labs:  04/01/2020: HDL 37, LDL 68, total cholesterol 122, triglycerides 77  Allergies   Allergies  Allergen Reactions  . Doxycycline Itching    As of 03/31/20, pt does not recall this reaction    Medications Prior to Visit:   Outpatient Medications Prior to Visit  Medication Sig Dispense Refill  . aspirin EC 81 MG tablet Take 81 mg by mouth daily.    [name]  atorvastatin (LIPITOR) 10 MG tablet TAKE 1 TABLET BY MOUTH EVERYDAY AT BEDTIME 90 tablet 1  . busPIRone (BUSPAR) 10 MG tablet TAKE 1 TABLET (10 MG TOTAL) BY MOUTH 3 (THREE) TIMES DAILY. FOR ANXIETY 270 tablet 0  . cholecalciferol (VITAMIN D3) 25 MCG (1000 UT) tablet Take 2,000 Units by  mouth daily.    [name] escitalopram (LEXAPRO) 20 MG tablet TAKE 1 TABLET BY MOUTH EVERY DAY 90 tablet 1  . losartan-hydrochlorothiazide (HYZAAR) 50-12.5 MG tablet TAKE 1 TABLET BY MOUTH EVERY DAY 90 tablet 2  . nitroGLYCERIN (NITROSTAT) 0.4 MG SL tablet Place 1 tablet (0.4 mg total) under the tongue every 5 (five) minutes as needed for chest pain. 30 tablet 3  . Omega-3 Fatty Acids (FISH OIL) 1000 MG CAPS Take by mouth.    . vitamin C (ASCORBIC ACID) 500 MG tablet Take 500 mg by mouth daily.     No facility-administered medications prior to visit.   Final Medications at End of Visit    Current Meds  Medication Sig  . aspirin EC 81 MG tablet Take 81 mg by mouth daily.  [name] atorvastatin (LIPITOR) 10 MG tablet TAKE 1 TABLET BY MOUTH EVERYDAY AT BEDTIME  . busPIRone (BUSPAR) 10 MG tablet TAKE 1 TABLET (10 MG TOTAL) BY MOUTH 3 (THREE) TIMES DAILY. FOR ANXIETY  . cholecalciferol (VITAMIN D3) 25 MCG (1000 UT) tablet Take 2,000 Units by mouth daily.  [name] escitalopram (LEXAPRO) 20 MG tablet TAKE 1 TABLET BY MOUTH EVERY DAY  . losartan-hydrochlorothiazide (HYZAAR) 50-12.5 MG tablet TAKE 1 TABLET BY MOUTH EVERY DAY  . nitroGLYCERIN (NITROSTAT) 0.4 MG SL tablet Place 1 tablet (0.4 mg total) under the tongue every 5 (five) minutes as needed for chest pain.  . Omega-3 Fatty Acids (FISH OIL) 1000 MG CAPS Take by mouth.  . vitamin C (ASCORBIC ACID) 500 MG tablet Take 500 mg by mouth daily.     Radiology:   CTA chest 08/30/2019: 1. No CT findings for pulmonary embolism. 2. Atherosclerotic calcifications involving the thoracic aorta and coronary arteries but no aneurysm or dissection. 3. Two adjacent right lower lobe pulmonary  nodules are indeterminate but with history of smoking and emphysematous changes I would recommend a follow-up noncontrast chest CT in 6-12 months. There is also a borderline right infrahilar lymph node which can be re-examined at that time. 4. Calcified granulomas involving the chest and abdomen.  MR angio head without contrast 03/30/2020: 1. No acute intracranial abnormality. 2. Mild age-related cerebral atrophy with chronic small vessel ischemic disease.  Cardiac Studies:   Lexiscan Tetrofosmin stress test 07/12/2019: No previous exam available for comparison. Myocardial perfusion imaging is normal. Left ventricular ejection fraction is 50% with normal wall motion. Low risk study.  Cardiac MR with and without contrast 07/06/2019: 1. Mildly dilated left ventricle with moderate concentric left ventricular hypertrophy and normal left ventricular systolic function (LVEF = 60%). There are no regional wall motion abnormalities. These findings are not consistent with an infiltrative cardiomyopathy. T1 mapping showed normal extracellular volume (ECV) of 22% (normal < 25%). 2.  Normal RV thickness and systolic function. 3.  Moderately dilated left and right atrial size. 4.  Normal aortic root, ascending aorta and pulmonary artery.   5.  Moderate mitral and tricuspid regurgitation. 6.  Normal pericardium.  Carotid artery duplex 03/31/2020: Right Carotid: Velocities in the right ICA are consistent with a 1-39% stenosis.  Left Carotid: Velocities in the left ICA are consistent with a 1-39% stenosis.  Vertebrals: Bilateral vertebral arteries demonstrate antegrade flow.  Echocardiogram 03/31/2020: 1. Left ventricular ejection fraction, by estimation, is 60 to 65%. The left ventricle has normal function. The left ventricle has no regional wall motion abnormalities. Left ventricular diastolic parameters are consistent with Grade I diastolic  dysfunction (impaired relaxation).  2. Right ventricular  systolic function is  normal. The right ventricular size is normal.  3. The mitral valve is normal in structure. No evidence of mitral valve regurgitation. No evidence of mitral stenosis.  4. The aortic valve is normal in structure. Aortic valve regurgitation is not visualized. No aortic stenosis is present.  5. The inferior vena cava is normal in size with greater than 50% respiratory variability, suggesting right atrial pressure of 3 mmHg.  Zio Patch Extended out patient monitoring 14 days 04/07/2020 to 04/21/2020:  Predominant rhythm is sinus rhythm.  2 SVT runs occurred, fastest 4 beats at rate 185 and longest 6 beats at 138 bpm.  Occasional PVCs and PACs.  No patient activated events.  EKG:  06/06/2021: Sinus rhythm at a rate of 65 bpm.  Normal axis.  Left atrial enlargement.  Right bundle branch block.  No evidence of ischemia or underlying injury pattern.  Compared to EKG 05/21/2021, no significant change.  05/21/2021: Sinus rhythm with borderline first-degree AV block at a rate of 63 bpm.  Left atrial enlargement.  Right bundle branch block.  Nonspecific T wave abnormality.    10/26/2020: Normal sinus rhythm at rate of 62 bpm, normal axis.  Right bundle branch block.  Low-voltage complexes.  No evidence of ischemia. EKG 02/23/2020: Normal sinus rhythm at rate of 60 bpm, normal axis, right bundle branch block.  No evidence of ischemia.  No significant change from 08/24/2018.  Assessment     ICD-10-CM   1. Other chest pain  R07.89 EKG 12-Lead    2. Loop recorder Biotronik 05/25/2021 for cryptogenic stroke  Z95.818       Recommendations:   Derek Ryan  is a 65 y.o. Caucasian male with hypertension, hyperlipidemia, hyperglycemia, sleep apnea (noncompliant with CPAP), anxiety and depression.  History of COVID-19 pneumonia 05/2019.  Admitted to the hospital on 03/30/2020 and discharged on 04/02/2020 with left visual loss associated with headache and left facial paresthesia and diagnosed with  central retinal artery occlusion and underwent extensive evaluation including outpatient extended telemetry.  No etiology was found.  He was also evaluated by neurology and it was agreed upon that he would benefit from loop recorder implantation, subsequently underwent loop recorder implantation 05/25/2021.  Patient presents for urgent visit today with concerns of chest pain.  Patient's symptoms are atypical at best and EKG is unchanged compared to previous without evidence of acute ischemia.  Patient has had no recurrence of chest pain since last night.  Review of loop recorder data showed no significant abnormalities.  Patient's chest discomfort is likely noncardiac.   Suspect patient's chest discomfort may be related to underlying GERD.  Counseled patient regarding signs and symptoms that would warrant urgent or emergent evaluation.  Also discussed with patient as needed dosing of sublingual nitroglycerin, he verbalized understanding agreement.  Do not feel further ischemic evaluation is warranted at this time, however could consider this in the future if patient's symptoms worsen or become more concerning for cardiac etiology.  No changes are made to patient's medications at this time.  He may keep follow-up appointment as previously scheduled.   Alethia Berthold, PA-C 06/06/2021, 12:39 PM Office: [phone]

## 2021-06-06 NOTE — Telephone Encounter (Signed)
Pt wanted to come today so he made an appt with [facility]

## 2021-06-24 DIAGNOSIS — Z95818 Presence of other cardiac implants and grafts: Secondary | ICD-10-CM | POA: Diagnosis not present

## 2021-06-24 DIAGNOSIS — I639 Cerebral infarction, unspecified: Secondary | ICD-10-CM | POA: Diagnosis not present

## 2021-06-24 DIAGNOSIS — Z4509 Encounter for adjustment and management of other cardiac device: Secondary | ICD-10-CM | POA: Diagnosis not present

## 2021-06-28 ENCOUNTER — Encounter: Payer: Medicare Other | Admitting: Cardiology

## 2021-07-07 ENCOUNTER — Other Ambulatory Visit: Payer: Self-pay | Admitting: Cardiology

## 2021-07-07 DIAGNOSIS — I10 Essential (primary) hypertension: Secondary | ICD-10-CM

## 2021-07-07 DIAGNOSIS — I110 Hypertensive heart disease with heart failure: Secondary | ICD-10-CM

## 2021-07-16 ENCOUNTER — Other Ambulatory Visit: Payer: Self-pay | Admitting: Family Medicine

## 2021-07-16 DIAGNOSIS — F411 Generalized anxiety disorder: Secondary | ICD-10-CM

## 2021-07-25 DIAGNOSIS — Z4509 Encounter for adjustment and management of other cardiac device: Secondary | ICD-10-CM | POA: Diagnosis not present

## 2021-07-25 DIAGNOSIS — I639 Cerebral infarction, unspecified: Secondary | ICD-10-CM | POA: Diagnosis not present

## 2021-07-25 DIAGNOSIS — Z95818 Presence of other cardiac implants and grafts: Secondary | ICD-10-CM | POA: Diagnosis not present

## 2021-08-16 ENCOUNTER — Ambulatory Visit (INDEPENDENT_AMBULATORY_CARE_PROVIDER_SITE_OTHER): Payer: Medicare Other | Admitting: Nurse Practitioner

## 2021-08-16 ENCOUNTER — Encounter: Payer: Self-pay | Admitting: Nurse Practitioner

## 2021-08-16 DIAGNOSIS — Z91199 Patient's noncompliance with other medical treatment and regimen due to unspecified reason: Secondary | ICD-10-CM | POA: Insufficient documentation

## 2021-08-16 DIAGNOSIS — S39012A Strain of muscle, fascia and tendon of lower back, initial encounter: Secondary | ICD-10-CM | POA: Diagnosis not present

## 2021-08-16 DIAGNOSIS — M5416 Radiculopathy, lumbar region: Secondary | ICD-10-CM | POA: Diagnosis not present

## 2021-08-16 NOTE — Progress Notes (Signed)
Patient did not show for visit.  Tried 3 times to reach patient all calls went to voicemail.

## 2021-08-25 DIAGNOSIS — I639 Cerebral infarction, unspecified: Secondary | ICD-10-CM | POA: Diagnosis not present

## 2021-08-25 DIAGNOSIS — Z95818 Presence of other cardiac implants and grafts: Secondary | ICD-10-CM | POA: Diagnosis not present

## 2021-08-25 DIAGNOSIS — Z4509 Encounter for adjustment and management of other cardiac device: Secondary | ICD-10-CM | POA: Diagnosis not present

## 2021-08-26 ENCOUNTER — Encounter: Payer: Self-pay | Admitting: Cardiology

## 2021-08-26 DIAGNOSIS — M5431 Sciatica, right side: Secondary | ICD-10-CM | POA: Diagnosis not present

## 2021-08-26 DIAGNOSIS — Z79899 Other long term (current) drug therapy: Secondary | ICD-10-CM | POA: Diagnosis not present

## 2021-08-26 DIAGNOSIS — M79661 Pain in right lower leg: Secondary | ICD-10-CM | POA: Diagnosis not present

## 2021-08-26 DIAGNOSIS — F17210 Nicotine dependence, cigarettes, uncomplicated: Secondary | ICD-10-CM | POA: Diagnosis not present

## 2021-08-26 DIAGNOSIS — M79604 Pain in right leg: Secondary | ICD-10-CM | POA: Diagnosis not present

## 2021-08-26 DIAGNOSIS — M5441 Lumbago with sciatica, right side: Secondary | ICD-10-CM | POA: Diagnosis not present

## 2021-08-26 DIAGNOSIS — I10 Essential (primary) hypertension: Secondary | ICD-10-CM | POA: Diagnosis not present

## 2021-09-25 DIAGNOSIS — Z95818 Presence of other cardiac implants and grafts: Secondary | ICD-10-CM | POA: Diagnosis not present

## 2021-09-25 DIAGNOSIS — I639 Cerebral infarction, unspecified: Secondary | ICD-10-CM | POA: Diagnosis not present

## 2021-09-25 DIAGNOSIS — Z4509 Encounter for adjustment and management of other cardiac device: Secondary | ICD-10-CM | POA: Diagnosis not present

## 2021-10-26 DIAGNOSIS — I639 Cerebral infarction, unspecified: Secondary | ICD-10-CM | POA: Diagnosis not present

## 2021-10-26 DIAGNOSIS — Z95818 Presence of other cardiac implants and grafts: Secondary | ICD-10-CM | POA: Diagnosis not present

## 2021-10-26 DIAGNOSIS — Z4509 Encounter for adjustment and management of other cardiac device: Secondary | ICD-10-CM | POA: Diagnosis not present

## 2021-10-29 ENCOUNTER — Other Ambulatory Visit: Payer: Self-pay | Admitting: Family Medicine

## 2021-10-29 DIAGNOSIS — F411 Generalized anxiety disorder: Secondary | ICD-10-CM

## 2021-11-26 ENCOUNTER — Ambulatory Visit: Payer: Medicare Other | Admitting: Cardiology

## 2021-11-26 DIAGNOSIS — I639 Cerebral infarction, unspecified: Secondary | ICD-10-CM | POA: Diagnosis not present

## 2021-11-26 DIAGNOSIS — Z95818 Presence of other cardiac implants and grafts: Secondary | ICD-10-CM | POA: Diagnosis not present

## 2021-11-26 DIAGNOSIS — Z4509 Encounter for adjustment and management of other cardiac device: Secondary | ICD-10-CM | POA: Diagnosis not present

## 2021-11-28 ENCOUNTER — Other Ambulatory Visit: Payer: Self-pay | Admitting: Family Medicine

## 2021-11-28 DIAGNOSIS — F411 Generalized anxiety disorder: Secondary | ICD-10-CM

## 2021-11-28 DIAGNOSIS — E785 Hyperlipidemia, unspecified: Secondary | ICD-10-CM

## 2021-11-28 DIAGNOSIS — F321 Major depressive disorder, single episode, moderate: Secondary | ICD-10-CM

## 2021-11-30 ENCOUNTER — Ambulatory Visit: Payer: Medicare Other | Admitting: Cardiology

## 2021-12-20 ENCOUNTER — Other Ambulatory Visit: Payer: Self-pay | Admitting: Family Medicine

## 2021-12-20 DIAGNOSIS — E785 Hyperlipidemia, unspecified: Secondary | ICD-10-CM

## 2021-12-20 DIAGNOSIS — F411 Generalized anxiety disorder: Secondary | ICD-10-CM

## 2021-12-20 DIAGNOSIS — F321 Major depressive disorder, single episode, moderate: Secondary | ICD-10-CM

## 2021-12-20 MED ORDER — ESCITALOPRAM OXALATE 20 MG PO TABS: 20.0000 mg | ORAL_TABLET | Freq: Every day | ORAL | 0 refills | Status: DC

## 2021-12-20 MED ORDER — ATORVASTATIN CALCIUM 10 MG PO TABS: ORAL_TABLET | ORAL | 0 refills | Status: DC

## 2021-12-20 NOTE — Addendum Note (Signed)
Addended by: Antonietta Barcelona D on: 12/20/2021 02:45 PM ? ? Modules accepted: Orders ? ?

## 2021-12-20 NOTE — Telephone Encounter (Signed)
Apt scheduled 01/03/2022 ?

## 2021-12-20 NOTE — Telephone Encounter (Signed)
[place]. NTBS 30 days given 11/28/21 ?Sent on 2 previous meds ?

## 2021-12-20 NOTE — Addendum Note (Signed)
Addended by: Antonietta Barcelona D on: 12/20/2021 02:46 PM ? ? Modules accepted: Orders ? ?

## 2021-12-20 NOTE — Telephone Encounter (Signed)
[place]. NTBS 30 days given 11/28/21 ?

## 2021-12-27 DIAGNOSIS — Z95818 Presence of other cardiac implants and grafts: Secondary | ICD-10-CM | POA: Diagnosis not present

## 2021-12-27 DIAGNOSIS — I639 Cerebral infarction, unspecified: Secondary | ICD-10-CM | POA: Diagnosis not present

## 2021-12-27 DIAGNOSIS — Z4509 Encounter for adjustment and management of other cardiac device: Secondary | ICD-10-CM | POA: Diagnosis not present

## 2022-01-03 ENCOUNTER — Ambulatory Visit (INDEPENDENT_AMBULATORY_CARE_PROVIDER_SITE_OTHER): Payer: Medicare Other | Admitting: Family Medicine

## 2022-01-03 ENCOUNTER — Encounter: Payer: Self-pay | Admitting: Family Medicine

## 2022-01-03 VITALS — BP 128/73 | HR 61 | Temp 97.8°F | Ht 68.0 in | Wt 238.2 lb

## 2022-01-03 DIAGNOSIS — F321 Major depressive disorder, single episode, moderate: Secondary | ICD-10-CM

## 2022-01-03 DIAGNOSIS — I10 Essential (primary) hypertension: Secondary | ICD-10-CM | POA: Diagnosis not present

## 2022-01-03 DIAGNOSIS — R0789 Other chest pain: Secondary | ICD-10-CM | POA: Diagnosis not present

## 2022-01-03 DIAGNOSIS — F411 Generalized anxiety disorder: Secondary | ICD-10-CM

## 2022-01-03 DIAGNOSIS — R7303 Prediabetes: Secondary | ICD-10-CM | POA: Diagnosis not present

## 2022-01-03 DIAGNOSIS — E785 Hyperlipidemia, unspecified: Secondary | ICD-10-CM

## 2022-01-03 MED ORDER — ATORVASTATIN CALCIUM 10 MG PO TABS: ORAL_TABLET | ORAL | 3 refills | Status: DC

## 2022-01-03 MED ORDER — ESCITALOPRAM OXALATE 20 MG PO TABS: 20.0000 mg | ORAL_TABLET | Freq: Every day | ORAL | 3 refills | Status: DC

## 2022-01-03 MED ORDER — NITROGLYCERIN 0.4 MG SL SUBL: 0.4000 mg | SUBLINGUAL_TABLET | SUBLINGUAL | 1 refills | Status: DC | PRN

## 2022-01-03 NOTE — Patient Instructions (Addendum)
Plan for fasting labs in November for your physical.

## 2022-01-03 NOTE — Progress Notes (Signed)
Subjective: CC: Chronic follow-up PCP: Janora Norlander, DO LFY:BOFBP [name] Ryan is a 66 y.o. male presenting to clinic today for:  1.  Hypertension with hyperlipidemia and history of cryptogenic stroke Does not report any recent atypical chest pains.  He is under the care of neurology and cardiology.  He is compliant with all medications but needs renewals.  Uncertain if his nitroglycerin is expired but he was keeping this on hand in case he has any chest pains  2.  Anxiety disorder Patient reports good control of anxiety disorder with BuSpar 10 mg 3 times daily and Lexapro 20 mg daily.  He occasionally "has bad days" but he feels like everybody has this and does not require extra medications.   ROS: Per HPI  Allergies  Allergen Reactions   Doxycycline Itching    As of 03/31/20, pt does not recall this reaction   Past Medical History:  Diagnosis Date   Central retinal artery occlusion of left eye    Chronic diastolic heart failure ([place]) 08/25/2019   Cryptogenic stroke ([place]) 05/26/2021   Depression    GERD (gastroesophageal reflux disease)    Hyperlipidemia    Hypertension    Loop recorder Biomonitor-Biotronik 05/25/2021 05/26/2021   LVH (left ventricular hypertrophy) due to hypertensive disease 08/25/2019   OSA (obstructive sleep apnea)    unable to tolerate CPAP   Retinal artery branch occlusion, 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    Current Outpatient Medications:    aspirin EC 81 MG tablet, Take 81 mg by mouth daily., Disp: , Rfl:    atorvastatin (LIPITOR) 10 MG tablet, TAKE 1 TABLET BY MOUTH EVERYDAY AT BEDTIME, Disp: 30 tablet, Rfl: 0   busPIRone (BUSPAR) 10 MG tablet, TAKE 1 TABLET (10 MG TOTAL) BY MOUTH 3 (THREE) TIMES DAILY. FOR ANXIETY, Disp: 270 tablet, Rfl: 3   cholecalciferol (VITAMIN D3) 25 MCG (1000 UT) tablet, Take 2,000 Units by mouth daily., Disp: , Rfl:    escitalopram (LEXAPRO) 20 MG tablet, Take 1 tablet (20 mg total) by mouth daily., Disp: 30 tablet, Rfl: 0    losartan-hydrochlorothiazide (HYZAAR) 50-12.5 MG tablet, TAKE 1 TABLET BY MOUTH EVERY DAY, Disp: 90 tablet, Rfl: 2   Omega-3 Fatty Acids (FISH OIL) 1000 MG CAPS, Take by mouth., Disp: , Rfl:    vitamin C (ASCORBIC ACID) 500 MG tablet, Take 500 mg by mouth daily., Disp: , Rfl:    nitroGLYCERIN (NITROSTAT) 0.4 MG SL tablet, Place 1 tablet (0.4 mg total) under the tongue every 5 (five) minutes as needed for chest pain. 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   Works at [employer] Part time.    Social Determinants of Health   Financial Resource Strain: Low Risk    Difficulty of Paying Living Expenses: Not hard at all  Food Insecurity: No Food Insecurity   Worried About Charity fundraiser in the Last Year: Never true   [place] in the Last Year: Never true  Transportation Needs: No Transportation Needs   Lack of Transportation (Medical): No   Lack of Transportation (Non-Medical): No  Physical Activity: Insufficiently Active   Days of Exercise per Week: 3 days   Minutes of Exercise per Session: 20 min  Stress: Stress Concern Present   Feeling of Stress : To some extent  Social Connections: Socially Isolated   Frequency of  Communication with Friends and Family: More than three times a week   Frequency of Social Gatherings with Friends and Family: More than three times a week   Attends Religious Services: Never   Marine scientist or Organizations: No   Attends Archivist Meetings: Never   Marital Status: Widowed  Human resources officer Violence: Not At Risk   Fear of Current or Ex-Partner: No   Emotionally Abused: No   Physically Abused: No   Sexually Abused: No   Family History  Problem Relation Age of Onset   Heart attack Mother    Alcohol abuse Father    Stomach cancer Sister    Hypertension Brother    Heart attack Brother    Brain cancer Brother    Heart attack Nephew 49       deceased from massive MI    Objective: Office vital signs reviewed. BP 128/73   Pulse 61   Temp 97.8 F (36.6 C)   Ht '5\' 8"'$  (1.727 m)   Wt 238 lb 3.2 oz (108 kg)   SpO2 (!) 61%   BMI 36.22 kg/m   Physical Examination:  General: Awake, alert, nontoxic-appearing obese male, No acute distress HEENT: Sclera white Cardio: regular rate and rhythm, S1S2 heard, no murmurs appreciated Pulm: clear to auscultation bilaterally, no wheezes, rhonchi or rales; normal work of breathing on room air GI: Protuberant but soft and nontender Extremities: warm, well perfused, No edema, cyanosis or clubbing; +2 pulses bilaterally MSK: Ambulating independently with normal cone  Assessment/ Plan: 66 y.o. male   Dyslipidemia - Plan: atorvastatin (LIPITOR) 10 MG tablet  Essential hypertension  Atypical chest pain - Plan: nitroGLYCERIN (NITROSTAT) 0.4 MG SL tablet  Pre-diabetes  GAD (generalized anxiety disorder) - Plan: escitalopram (LEXAPRO) 20 MG tablet  Moderate single current episode of major depressive disorder (HCC) - Plan: escitalopram (LEXAPRO) 20 MG tablet  Not yet due for labs.  We discussed weight loss and diet modification.  Advised to utilize [facility] as a resource.  Sounds like his main issue  is carbohydrates.  Continue Lipitor, blood pressure medications  Nitroglycerin has been renewed for use if needed  Anxiety disorder is stable.  Medications have been renewed  No orders of the defined types were placed in this encounter.  No orders of the defined types were placed in this encounter.    Janora Norlander, DO [place] [phone]

## 2022-01-27 DIAGNOSIS — Z95818 Presence of other cardiac implants and grafts: Secondary | ICD-10-CM | POA: Diagnosis not present

## 2022-01-27 DIAGNOSIS — I639 Cerebral infarction, unspecified: Secondary | ICD-10-CM | POA: Diagnosis not present

## 2022-01-27 DIAGNOSIS — Z4509 Encounter for adjustment and management of other cardiac device: Secondary | ICD-10-CM | POA: Diagnosis not present

## 2022-02-27 DIAGNOSIS — Z4509 Encounter for adjustment and management of other cardiac device: Secondary | ICD-10-CM | POA: Diagnosis not present

## 2022-02-27 DIAGNOSIS — Z95818 Presence of other cardiac implants and grafts: Secondary | ICD-10-CM | POA: Diagnosis not present

## 2022-02-27 DIAGNOSIS — I639 Cerebral infarction, unspecified: Secondary | ICD-10-CM | POA: Diagnosis not present

## 2022-03-30 DIAGNOSIS — Z95818 Presence of other cardiac implants and grafts: Secondary | ICD-10-CM | POA: Diagnosis not present

## 2022-03-30 DIAGNOSIS — Z4509 Encounter for adjustment and management of other cardiac device: Secondary | ICD-10-CM | POA: Diagnosis not present

## 2022-03-30 DIAGNOSIS — I639 Cerebral infarction, unspecified: Secondary | ICD-10-CM | POA: Diagnosis not present

## 2022-04-25 ENCOUNTER — Ambulatory Visit: Payer: Medicare Other

## 2022-04-29 DIAGNOSIS — D124 Benign neoplasm of descending colon: Secondary | ICD-10-CM | POA: Diagnosis not present

## 2022-04-29 DIAGNOSIS — Z09 Encounter for follow-up examination after completed treatment for conditions other than malignant neoplasm: Secondary | ICD-10-CM | POA: Diagnosis not present

## 2022-04-29 DIAGNOSIS — Z1211 Encounter for screening for malignant neoplasm of colon: Secondary | ICD-10-CM | POA: Diagnosis not present

## 2022-04-29 DIAGNOSIS — Z86010 Personal history of colonic polyps: Secondary | ICD-10-CM | POA: Diagnosis not present

## 2022-04-29 DIAGNOSIS — D123 Benign neoplasm of transverse colon: Secondary | ICD-10-CM | POA: Diagnosis not present

## 2022-04-30 DIAGNOSIS — I639 Cerebral infarction, unspecified: Secondary | ICD-10-CM | POA: Diagnosis not present

## 2022-04-30 DIAGNOSIS — Z4509 Encounter for adjustment and management of other cardiac device: Secondary | ICD-10-CM | POA: Diagnosis not present

## 2022-04-30 DIAGNOSIS — Z95818 Presence of other cardiac implants and grafts: Secondary | ICD-10-CM | POA: Diagnosis not present

## 2022-05-14 ENCOUNTER — Other Ambulatory Visit: Payer: Self-pay | Admitting: Cardiology

## 2022-05-14 DIAGNOSIS — I10 Essential (primary) hypertension: Secondary | ICD-10-CM

## 2022-05-14 DIAGNOSIS — I110 Hypertensive heart disease with heart failure: Secondary | ICD-10-CM

## 2022-05-27 ENCOUNTER — Telehealth: Payer: Self-pay | Admitting: Family Medicine

## 2022-05-28 ENCOUNTER — Ambulatory Visit: Payer: Medicare Other

## 2022-05-30 DIAGNOSIS — R6889 Other general symptoms and signs: Secondary | ICD-10-CM | POA: Diagnosis not present

## 2022-05-30 DIAGNOSIS — S39012A Strain of muscle, fascia and tendon of lower back, initial encounter: Secondary | ICD-10-CM | POA: Diagnosis not present

## 2022-05-30 DIAGNOSIS — Z743 Need for continuous supervision: Secondary | ICD-10-CM | POA: Diagnosis not present

## 2022-05-30 DIAGNOSIS — M25572 Pain in left ankle and joints of left foot: Secondary | ICD-10-CM | POA: Diagnosis not present

## 2022-05-30 DIAGNOSIS — M25551 Pain in right hip: Secondary | ICD-10-CM | POA: Diagnosis not present

## 2022-05-30 DIAGNOSIS — R911 Solitary pulmonary nodule: Secondary | ICD-10-CM | POA: Diagnosis not present

## 2022-05-31 DIAGNOSIS — Z4509 Encounter for adjustment and management of other cardiac device: Secondary | ICD-10-CM | POA: Diagnosis not present

## 2022-05-31 DIAGNOSIS — I639 Cerebral infarction, unspecified: Secondary | ICD-10-CM | POA: Diagnosis not present

## 2022-05-31 DIAGNOSIS — Z95818 Presence of other cardiac implants and grafts: Secondary | ICD-10-CM | POA: Diagnosis not present

## 2022-06-02 DIAGNOSIS — M545 Low back pain, unspecified: Secondary | ICD-10-CM | POA: Diagnosis not present

## 2022-06-13 DIAGNOSIS — M7918 Myalgia, other site: Secondary | ICD-10-CM | POA: Diagnosis not present

## 2022-06-13 DIAGNOSIS — M47816 Spondylosis without myelopathy or radiculopathy, lumbar region: Secondary | ICD-10-CM | POA: Diagnosis not present

## 2022-06-21 ENCOUNTER — Ambulatory Visit (INDEPENDENT_AMBULATORY_CARE_PROVIDER_SITE_OTHER): Payer: Medicare Other

## 2022-06-21 VITALS — Ht 68.0 in | Wt 234.0 lb

## 2022-06-21 DIAGNOSIS — Z0000 Encounter for general adult medical examination without abnormal findings: Secondary | ICD-10-CM | POA: Diagnosis not present

## 2022-06-21 NOTE — Patient Instructions (Signed)
Derek Ryan , Thank you for taking time to come for your Medicare Wellness Visit. I appreciate your ongoing commitment to your health goals. Please review the following plan we discussed and let me know if I can assist you in the future.   These are the goals we discussed:  Goals      exercise     Pt would like to eat better and exercise more.      Have 3 meals a day     Watching carb intake.        This is a list of the screening recommended for you and due dates:  Health Maintenance  Topic Date Due   Zoster (Shingles) Vaccine (1 of 2) Never done   COVID-19 Vaccine (5 - Mixed Product risk series) 10/09/2020   Tetanus Vaccine  01/17/2022   Flu Shot  11/17/2022*   Pneumonia Vaccine (2 - PPSV23 or PCV20) 01/04/2023*   Medicare Annual Wellness Visit  06/22/2023   Colon Cancer Screening  04/29/2025   Hepatitis C Screening: USPSTF Recommendation to screen - Ages 18-79 yo.  Completed   HPV Vaccine  Aged Out  *Topic was postponed. The date shown is not the original due date.    Advanced directives: Please bring a copy of your health care power of attorney and living will to the office to be added to your chart at your convenience.   Conditions/risks identified: Aim for 30 minutes of exercise or brisk walking, 6-8 glasses of water, and 5 servings of fruits and vegetables each day.   Next appointment: Follow up in one year for your annual wellness visit.   Preventive Care 98 Years and Older, Male  Preventive care refers to lifestyle choices and visits with your health care provider that can promote health and wellness. What does preventive care include? A yearly physical exam. This is also called an annual well check. Dental exams once or twice a year. Routine eye exams. Ask your health care provider how often you should have your eyes checked. Personal lifestyle choices, including: Daily care of your teeth and gums. Regular physical activity. Eating a healthy diet. Avoiding  tobacco and drug use. Limiting alcohol use. Practicing safe sex. Taking low doses of aspirin every day. Taking vitamin and mineral supplements as recommended by your health care provider. What happens during an annual well check? The services and screenings done by your health care provider during your annual well check will depend on your age, overall health, lifestyle risk factors, and family history of disease. Counseling  Your health care provider may ask you questions about your: Alcohol use. Tobacco use. Drug use. Emotional well-being. Home and relationship well-being. Sexual activity. Eating habits. History of falls. Memory and ability to understand (cognition). Work and work Statistician. Screening  You may have the following tests or measurements: Height, weight, and BMI. Blood pressure. Lipid and cholesterol levels. These may be checked every 5 years, or more frequently if you are over 27 years old. Skin check. Lung cancer screening. You may have this screening every year starting at age 23 if you have a 30-pack-year history of smoking and currently smoke or have quit within the past 15 years. Fecal occult blood test (FOBT) of the stool. You may have this test every year starting at age 74. Flexible sigmoidoscopy or colonoscopy. You may have a sigmoidoscopy every 5 years or a colonoscopy every 10 years starting at age 75. Prostate cancer screening. Recommendations will vary depending on your family history  and other risks. Hepatitis C blood test. Hepatitis B blood test. Sexually transmitted disease (STD) testing. Diabetes screening. This is done by checking your blood sugar (glucose) after you have not eaten for a while (fasting). You may have this done every 1-3 years. Abdominal aortic aneurysm (AAA) screening. You may need this if you are a current or former smoker. Osteoporosis. You may be screened starting at age 27 if you are at high risk. Talk with your health care  provider about your test results, treatment options, and if necessary, the need for more tests. Vaccines  Your health care provider may recommend certain vaccines, such as: Influenza vaccine. This is recommended every year. Tetanus, diphtheria, and acellular pertussis (Tdap, Td) vaccine. You may need a Td booster every 10 years. Zoster vaccine. You may need this after age 33. Pneumococcal 13-valent conjugate (PCV13) vaccine. One dose is recommended after age 41. Pneumococcal polysaccharide (PPSV23) vaccine. One dose is recommended after age 47. Talk to your health care provider about which screenings and vaccines you need and how often you need them. This information is not intended to replace advice given to you by your health care provider. Make sure you discuss any questions you have with your health care provider. Document Released: 09/01/2015 Document Revised: 04/24/2016 Document Reviewed: 06/06/2015 Elsevier Interactive Patient Education  2017 [employer] Prevention in the Home Falls can cause injuries. They can happen to people of all ages. There are many things you can do to make your home safe and to help prevent falls. What can I do on the outside of my home? Regularly fix the edges of walkways and driveways and fix any cracks. Remove anything that might make you trip as you walk through a door, such as a raised step or threshold. Trim any bushes or trees on the path to your home. Use bright outdoor lighting. Clear any walking paths of anything that might make someone trip, such as rocks or tools. Regularly check to see if handrails are loose or broken. Make sure that both sides of any steps have handrails. Any raised decks and porches should have guardrails on the edges. Have any leaves, snow, or ice cleared regularly. Use sand or salt on walking paths during winter. Clean up any spills in your garage right away. This includes oil or grease spills. What can I do in the  bathroom? Use night lights. Install grab bars by the toilet and in the tub and shower. Do not use towel bars as grab bars. Use non-skid mats or decals in the tub or shower. If you need to sit down in the shower, use a plastic, non-slip stool. Keep the floor dry. Clean up any water that spills on the floor as soon as it happens. Remove soap buildup in the tub or shower regularly. Attach bath mats securely with double-sided non-slip rug tape. Do not have throw rugs and other things on the floor that can make you trip. What can I do in the bedroom? Use night lights. Make sure that you have a light by your bed that is easy to reach. Do not use any sheets or blankets that are too big for your bed. They should not hang down onto the floor. Have a firm chair that has side arms. You can use this for support while you get dressed. Do not have throw rugs and other things on the floor that can make you trip. What can I do in the kitchen? Clean up any spills  right away. Avoid walking on wet floors. Keep items that you use a lot in easy-to-reach places. If you need to reach something above you, use a strong step stool that has a grab bar. Keep electrical cords out of the way. Do not use floor polish or wax that makes floors slippery. If you must use wax, use non-skid floor wax. Do not have throw rugs and other things on the floor that can make you trip. What can I do with my stairs? Do not leave any items on the stairs. Make sure that there are handrails on both sides of the stairs and use them. Fix handrails that are broken or loose. Make sure that handrails are as long as the stairways. Check any carpeting to make sure that it is firmly attached to the stairs. Fix any carpet that is loose or worn. Avoid having throw rugs at the top or bottom of the stairs. If you do have throw rugs, attach them to the floor with carpet tape. Make sure that you have a light switch at the top of the stairs and the  bottom of the stairs. If you do not have them, ask someone to add them for you. What else can I do to help prevent falls? Wear shoes that: Do not have high heels. Have rubber bottoms. Are comfortable and fit you well. Are closed at the toe. Do not wear sandals. If you use a stepladder: Make sure that it is fully opened. Do not climb a closed stepladder. Make sure that both sides of the stepladder are locked into place. Ask someone to hold it for you, if possible. Clearly mark and make sure that you can see: Any grab bars or handrails. First and last steps. Where the edge of each step is. Use tools that help you move around (mobility aids) if they are needed. These include: Canes. Walkers. Scooters. Crutches. Turn on the lights when you go into a dark area. Replace any light bulbs as soon as they burn out. Set up your furniture so you have a clear path. Avoid moving your furniture around. If any of your floors are uneven, fix them. If there are any pets around you, be aware of where they are. Review your medicines with your doctor. Some medicines can make you feel dizzy. This can increase your chance of falling. Ask your doctor what other things that you can do to help prevent falls. This information is not intended to replace advice given to you by your health care provider. Make sure you discuss any questions you have with your health care provider. Document Released: 06/01/2009 Document Revised: 01/11/2016 Document Reviewed: 09/09/2014 Elsevier Interactive Patient Education  2017 [employer].

## 2022-06-21 NOTE — Progress Notes (Signed)
Subjective:   Derek Ryan is a 66 y.o. male who presents for Medicare Annual/Subsequent preventive examination.   I connected with  Derek Ryan on 06/21/22 by a audio enabled telemedicine application and verified that I am speaking with the correct person using two identifiers.  Patient Location: Home  Provider Location: Home Office  I discussed the limitations of evaluation and management by telemedicine. The patient expressed understanding and agreed to proceed.  Review of Systems     Cardiac Risk Factors include: advanced age (>1mn, >>49women);male gender;hypertension     Objective:    Today's Vitals   06/21/22 0830  Weight: 234 lb (106.1 kg)  Height: '5\' 8"'$  (1.727 m)   Body mass index is 35.58 kg/m.     06/21/2022    8:34 AM 04/24/2021   10:43 AM 04/20/2020   11:20 AM 03/30/2020   10:23 AM  Advanced Directives  Does Patient Have a Medical Advance Directive? Yes Yes No No  Type of [place] Living will    [place] of [place]? No - copy requested     Would patient like information on creating a medical advance directive?  No - Patient declined No - Patient declined No - Patient declined    Current Medications (verified) Outpatient Encounter Medications as of 06/21/2022  Medication Sig   aspirin EC 81 MG tablet Take 81 mg by mouth daily.   atorvastatin (LIPITOR) 10 MG tablet TAKE 1 TABLET BY MOUTH EVERYDAY AT BEDTIME   busPIRone (BUSPAR) 10 MG tablet TAKE 1 TABLET (10 MG TOTAL) BY MOUTH 3 (THREE) TIMES DAILY. FOR ANXIETY   cholecalciferol (VITAMIN D3) 25 MCG (1000 UT) tablet Take 2,000 Units by mouth daily.   escitalopram (LEXAPRO) 20 MG tablet Take 1 tablet (20 mg total) by mouth daily.   losartan-hydrochlorothiazide (HYZAAR) 50-12.5 MG tablet TAKE 1 TABLET BY MOUTH EVERY DAY   nitroGLYCERIN (NITROSTAT) 0.4 MG SL tablet Place 1 tablet (0.4 mg total) under the tongue every 5 (five) minutes  as needed for chest pain (then call 911).   Omega-3 Fatty Acids (FISH OIL) 1000 MG CAPS Take by mouth.   vitamin C (ASCORBIC ACID) 500 MG tablet Take 500 mg by mouth daily.   No facility-administered encounter medications on file as of 06/21/2022.    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[employer]-Biotronik 05/25/2021 05/26/2021   LVH (left ventricular hypertrophy) due to hypertensive disease 08/25/2019   OSA (obstructive sleep apnea)    unable to tolerate CPAP   Retinal artery branch occlusion, 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   Past Surgical History:  Procedure Laterality Date   COLONOSCOPY     Family History  Problem Relation Age of Onset   Heart attack Mother    Alcohol abuse Father    Stomach cancer Sister    Hypertension Brother    Heart attack Brother    Brain cancer Brother    Heart attack Nephew 49       deceased from massive MI   Social History   Socioeconomic History   Marital status: Widowed    Spouse name: Not on file   Number of  children: 3   Years of education: Not on file   Highest education level: Not on file  Occupational History   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   Works at [employer] Part  time.    Social Determinants of Health   Financial Resource Strain: Low Risk  (06/21/2022)   Overall Financial Resource Strain (CARDIA)    Difficulty of Paying Living Expenses: Not hard at all  Food Insecurity: No Food Insecurity (06/21/2022)   Hunger Vital Sign    Worried About Running Out of Food in the Last Year: Never true    Ran Out of Food in the Last Year: Never true  Transportation Needs: No Transportation Needs (06/21/2022)   PRAPARE - Hydrologist (Medical): No    Lack of Transportation (Non-Medical): No  Physical Activity: Insufficiently Active (06/21/2022)   Exercise Vital Sign    Days of Exercise per Week: 3 days    Minutes of Exercise per Session: 30 min  Stress: No Stress Concern Present (06/21/2022)   [place]    Feeling of Stress : Not at all  Social Connections: Socially Isolated (06/21/2022)   Social Connection and Isolation Panel [NHANES]    Frequency of Communication with Friends and Family: More than three times a week    Frequency of Social Gatherings with Friends and Family: More than three times a week    Attends Religious Services: Never    Marine scientist or Organizations: No    Attends Archivist Meetings: Never    Marital Status: Widowed    Tobacco Counseling Counseling given: Not Answered   Clinical Intake:  Pre-visit preparation completed: Yes  Pain : No/denies pain     Nutritional Risks: None Diabetes: No  How often do you need to have someone help you when you read instructions, pamphlets, or other written materials from your doctor or pharmacy?: 1 - Never  Diabetic?no   Interpreter Needed?: No  Information entered by :: Derek Pierini, LPN   Activities of Daily Living    06/21/2022    8:34 AM  In your present state of health, do you have any difficulty performing the following activities:  Hearing? 0  Vision? 0   Difficulty concentrating or making decisions? 0  Walking or climbing stairs? 0  Dressing or bathing? 0  Doing errands, shopping? 0  Preparing Food and eating ? N  Using the Toilet? N  In the past six months, have you accidently leaked urine? N  Do you have problems with loss of bowel control? N  Managing your Medications? N  Managing your Finances? N  Housekeeping or managing your Housekeeping? N    Patient Care Team: Derek Norlander, DO as PCP - General (Family Medicine)  Indicate any recent Medical Services you may  have received from other than [facility] providers in the past year (date may be approximate).     Assessment:   This is a routine wellness examination for Derek Ryan.  Hearing/Vision screen Vision Screening - Comments:: Patient to schedule eye appointment no glasses   Dietary issues and exercise activities discussed: Current Exercise Habits: Home exercise routine, Type of exercise: walking, Time (Minutes): 30, Frequency (Times/Week): 3, Weekly Exercise (Minutes/Week): 90, Intensity: Mild, Exercise limited by: orthopedic condition(s)   Goals Addressed             This Visit's Progress    exercise   On track    Pt would like to eat better and exercise more.        Depression Screen    06/21/2022    8:33 AM 01/03/2022    8:29 AM 04/24/2021   10:34 AM 04/20/2021    3:12 PM 10/18/2020    2:28 PM 04/20/2020   11:14 AM 04/07/2020   10:59 AM  PHQ 2/9 Scores  PHQ - 2 Score 0 0 4 4 0 0 0  PHQ- 9 Score  '2 17 17 2      '$ Fall Risk    06/21/2022    8:31 AM 01/03/2022    8:29 AM 04/24/2021   10:46 AM 04/20/2021    3:13 PM 04/20/2020   11:13 AM  Fall Risk   Falls in the past year? 0 0 0 0 0  Number falls in past yr: 0  0    Injury with Fall? 0  0    Risk for fall due to : No Fall Risks  No Fall Risks    Follow up Falls prevention discussed  Falls prevention discussed      FALL RISK PREVENTION PERTAINING TO THE HOME:  Any stairs in or around the home? No  If so, are  there any without handrails? No  Home free of loose throw rugs in walkways, pet beds, electrical cords, etc? Yes  Adequate lighting in your home to reduce risk of falls? Yes   ASSISTIVE DEVICES UTILIZED TO PREVENT FALLS:  Life alert? No  Use of a cane, walker or w/c? No  Grab bars in the bathroom? Yes  Shower chair or bench in shower? Yes  Elevated toilet seat or a handicapped toilet? Yes          06/21/2022    8:34 AM 04/24/2021   10:49 AM 04/20/2020   11:17 AM  6CIT Screen  What Year? 0 points 0 points 0 points  What month? 0 points 0 points 0 points  What time? 0 points 0 points 0 points  Count back from 20 0 points 0 points 0 points  Months in reverse 0 points 0 points 0 points  Repeat phrase 0 points 2 points 0 points  Total Score 0 points 2 points 0 points    Immunizations Immunization History  Administered Date(s) Administered   Moderna Sars-Covid-2 Vaccination 12/14/2019, 01/11/2020, 08/14/2020   Pneumococcal Conjugate-13 07/26/2019   Tdap 01/18/2012   Unspecified SARS-COV-2 Vaccination 12/14/2019    TDAP status: Due, Education has been provided regarding the importance of this vaccine. Advised may receive this vaccine at local pharmacy or Health Dept. Aware to provide a copy of the vaccination record if obtained from local pharmacy or Health Dept. Verbalized acceptance and understanding.  Flu Vaccine status: Declined, Education has been provided regarding the importance of this vaccine but patient still declined. Advised may receive this vaccine at local pharmacy or Health  Dept. Aware to provide a copy of the vaccination record if obtained from local pharmacy or Health Dept. Verbalized acceptance and understanding.  Pneumococcal vaccine status: Up to date  Covid-19 vaccine status: Completed vaccines  Qualifies for Shingles Vaccine? Yes   Zostavax completed No   Shingrix Completed?: No.    Education has been provided regarding the importance of this vaccine. Patient  has been advised to call insurance company to determine out of pocket expense if they have not yet received this vaccine. Advised may also receive vaccine at local pharmacy or Health Dept. Verbalized acceptance and understanding.  Screening Tests Health Maintenance  Topic Date Due   Zoster Vaccines- Shingrix (1 of 2) Never done   COVID-19 Vaccine (5 - Mixed Product risk series) 10/09/2020   TETANUS/TDAP  01/17/2022   INFLUENZA VACCINE  11/17/2022 (Originally 03/19/2022)   Pneumonia Vaccine 19+ Years old (2 - PPSV23 or PCV20) 01/04/2023 (Originally 07/25/2020)   Medicare Annual Wellness (AWV)  06/22/2023   COLONOSCOPY (Pts 45-23yr Insurance coverage will need to be confirmed)  04/29/2025   Hepatitis C Screening  Completed   HPV VACCINES  Aged Out    Health Maintenance  Health Maintenance Due  Topic Date Due   Zoster Vaccines- Shingrix (1 of 2) Never done   COVID-19 Vaccine (5 - Mixed Product risk series) 10/09/2020   TETANUS/TDAP  01/17/2022    Colorectal cancer screening: Type of screening: Colonoscopy. Completed 04/19/2022. Repeat every 3 years  Lung Cancer Screening: (Low Dose CT Chest recommended if Age 68-80years, 30 pack-year currently smoking OR have quit w/in 15years.) does not qualify.   Lung Cancer Screening Referral: n/a  Additional Screening:  Hepatitis C Screening: does not qualify;   Vision Screening: Recommended annual ophthalmology exams for early detection of glaucoma and other disorders of the eye. Is the patient up to date with their annual eye exam?  No  Who is the provider or what is the name of the office in which the patient attends annual eye exams? None , Patient will schedule  If pt is not established with a provider, would they like to be referred to a provider to establish care? No .   Dental Screening: Recommended annual dental exams for proper oral hygiene  Community Resource Referral / Chronic Care Management: CRR required this visit?  No   CCM  required this visit?  No      Plan:     I have personally reviewed and noted the following in the patient's chart:   Medical and social history Use of alcohol, tobacco or illicit drugs  Current medications and supplements including opioid prescriptions. Patient is not currently taking opioid prescriptions. Functional ability and status Nutritional status Physical activity Advanced directives List of other physicians Hospitalizations, surgeries, and ER visits in previous 12 months Vitals Screenings to include cognitive, depression, and falls Referrals and appointments  In addition, I have reviewed and discussed with patient certain preventive protocols, quality metrics, and best practice recommendations. A written personalized care plan for preventive services as well as general preventive health recommendations were provided to patient.     LDaphane Shepherd LPN   185/04/2923  Nurse Notes: Due TDAP vaccine , patient to schedule Eye doctor appointment

## 2022-07-01 DIAGNOSIS — Z95818 Presence of other cardiac implants and grafts: Secondary | ICD-10-CM | POA: Diagnosis not present

## 2022-07-01 DIAGNOSIS — Z4509 Encounter for adjustment and management of other cardiac device: Secondary | ICD-10-CM | POA: Diagnosis not present

## 2022-07-01 DIAGNOSIS — I639 Cerebral infarction, unspecified: Secondary | ICD-10-CM | POA: Diagnosis not present

## 2022-07-15 ENCOUNTER — Encounter: Payer: Self-pay | Admitting: Family Medicine

## 2022-07-15 ENCOUNTER — Ambulatory Visit (INDEPENDENT_AMBULATORY_CARE_PROVIDER_SITE_OTHER): Payer: Medicare Other | Admitting: Family Medicine

## 2022-07-15 VITALS — BP 114/67 | HR 70 | Temp 97.9°F | Ht 68.0 in | Wt 235.2 lb

## 2022-07-15 DIAGNOSIS — Z0000 Encounter for general adult medical examination without abnormal findings: Secondary | ICD-10-CM

## 2022-07-15 DIAGNOSIS — R351 Nocturia: Secondary | ICD-10-CM

## 2022-07-15 DIAGNOSIS — E785 Hyperlipidemia, unspecified: Secondary | ICD-10-CM | POA: Diagnosis not present

## 2022-07-15 DIAGNOSIS — R7303 Prediabetes: Secondary | ICD-10-CM

## 2022-07-15 DIAGNOSIS — N400 Enlarged prostate without lower urinary tract symptoms: Secondary | ICD-10-CM | POA: Insufficient documentation

## 2022-07-15 DIAGNOSIS — Z0001 Encounter for general adult medical examination with abnormal findings: Secondary | ICD-10-CM

## 2022-07-15 DIAGNOSIS — I10 Essential (primary) hypertension: Secondary | ICD-10-CM | POA: Diagnosis not present

## 2022-07-15 DIAGNOSIS — Z2821 Immunization not carried out because of patient refusal: Secondary | ICD-10-CM | POA: Diagnosis not present

## 2022-07-15 DIAGNOSIS — F411 Generalized anxiety disorder: Secondary | ICD-10-CM

## 2022-07-15 DIAGNOSIS — H6122 Impacted cerumen, left ear: Secondary | ICD-10-CM

## 2022-07-15 DIAGNOSIS — N401 Enlarged prostate with lower urinary tract symptoms: Secondary | ICD-10-CM

## 2022-07-15 DIAGNOSIS — I700 Atherosclerosis of aorta: Secondary | ICD-10-CM | POA: Diagnosis not present

## 2022-07-15 DIAGNOSIS — F321 Major depressive disorder, single episode, moderate: Secondary | ICD-10-CM

## 2022-07-15 LAB — BAYER DCA HB A1C WAIVED: HB A1C (BAYER DCA - WAIVED): 6.3 % — ABNORMAL HIGH (ref 4.8–5.6)

## 2022-07-15 MED ORDER — BUSPIRONE HCL 10 MG PO TABS: ORAL_TABLET | ORAL | 3 refills | Status: DC

## 2022-07-15 MED ORDER — ESCITALOPRAM OXALATE 20 MG PO TABS: 20.0000 mg | ORAL_TABLET | Freq: Every day | ORAL | 3 refills | Status: DC

## 2022-07-15 MED ORDER — ATORVASTATIN CALCIUM 10 MG PO TABS: ORAL_TABLET | ORAL | 3 refills | Status: DC

## 2022-07-15 NOTE — Patient Instructions (Signed)
Preventive Care 65 Years and Older, Male Preventive care refers to lifestyle choices and visits with your health care provider that can promote health and wellness. Preventive care visits are also called wellness exams. What can I expect for my preventive care visit? Counseling During your preventive care visit, your health care provider may ask about your: Medical history, including: Past medical problems. Family medical history. History of falls. Current health, including: Emotional well-being. Home life and relationship well-being. Sexual activity. Memory and ability to understand (cognition). Lifestyle, including: Alcohol, nicotine or tobacco, and drug use. Access to firearms. Diet, exercise, and sleep habits. Work and work environment. Sunscreen use. Safety issues such as seatbelt and bike helmet use. Physical exam Your health care provider will check your: Height and weight. These may be used to calculate your BMI (body mass index). BMI is a measurement that tells if you are at a healthy weight. Waist circumference. This measures the distance around your waistline. This measurement also tells if you are at a healthy weight and may help predict your risk of certain diseases, such as type 2 diabetes and high blood pressure. Heart rate and blood pressure. Body temperature. Skin for abnormal spots. What immunizations do I need?  Vaccines are usually given at various ages, according to a schedule. Your health care provider will recommend vaccines for you based on your age, medical history, and lifestyle or other factors, such as travel or where you work. What tests do I need? Screening Your health care provider may recommend screening tests for certain conditions. This may include: Lipid and cholesterol levels. Diabetes screening. This is done by checking your blood sugar (glucose) after you have not eaten for a while (fasting). Hepatitis C test. Hepatitis B test. HIV (human  immunodeficiency virus) test. STI (sexually transmitted infection) testing, if you are at risk. Lung cancer screening. Colorectal cancer screening. Prostate cancer screening. Abdominal aortic aneurysm (AAA) screening. You may need this if you are a current or former smoker. Talk with your health care provider about your test results, treatment options, and if necessary, the need for more tests. Follow these instructions at home: Eating and drinking  Eat a diet that includes fresh fruits and vegetables, whole grains, lean protein, and low-fat dairy products. Limit your intake of foods with high amounts of sugar, saturated fats, and salt. Take vitamin and mineral supplements as recommended by your health care provider. Do not drink alcohol if your health care provider tells you not to drink. If you drink alcohol: Limit how much you have to 0-2 drinks a day. Know how much alcohol is in your drink. In the [place] one drink equals one 12 oz bottle of beer (355 mL), one 5 oz glass of wine (148 mL), or one 1 oz glass of hard liquor (44 mL). Lifestyle Brush your teeth every morning and night with fluoride toothpaste. Floss one time each day. Exercise for at least 30 minutes 5 or more days each week. Do not use any products that contain nicotine or tobacco. These products include cigarettes, chewing tobacco, and vaping devices, such as e-cigarettes. If you need help quitting, ask your health care provider. Do not use drugs. If you are sexually active, practice safe sex. Use a condom or other form of protection to prevent STIs. Take aspirin only as told by your health care provider. Make sure that you understand how much to take and what form to take. Work with your health care provider to find out whether it is safe   and beneficial for you to take aspirin daily. Ask your health care provider if you need to take a cholesterol-lowering medicine (statin). Find healthy ways to manage stress, such  as: Meditation, yoga, or listening to music. Journaling. Talking to a trusted person. Spending time with friends and family. Safety Always wear your seat belt while driving or riding in a vehicle. Do not drive: If you have been drinking alcohol. Do not ride with someone who has been drinking. When you are tired or distracted. While texting. If you have been using any mind-altering substances or drugs. Wear a helmet and other protective equipment during sports activities. If you have firearms in your house, make sure you follow all gun safety procedures. Minimize exposure to UV radiation to reduce your risk of skin cancer. What's next? Visit your health care provider once a year for an annual wellness visit. Ask your health care provider how often you should have your eyes and teeth checked. Stay up to date on all vaccines. This information is not intended to replace advice given to you by your health care provider. Make sure you discuss any questions you have with your health care provider. Document Revised: 01/31/2021 Document Reviewed: 01/31/2021 Elsevier Patient Education  2023 [employer]  
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
## 2022-07-16 LAB — CBC
Hematocrit: 46.6 % (ref 37.5–51.0)
Hemoglobin: 15.9 g/dL (ref 13.0–17.7)
MCH: 30.1 pg (ref 26.6–33.0)
MCHC: 34.1 g/dL (ref 31.5–35.7)
MCV: 88 fL (ref 79–97)
Platelets: 309 10*3/uL (ref 150–450)
RBC: 5.29 x10E6/uL (ref 4.14–5.80)
RDW: 11.9 % (ref 11.6–15.4)
WBC: 6.5 10*3/uL (ref 3.4–10.8)

## 2022-07-16 LAB — CMP14+EGFR
ALT: 35 IU/L (ref 0–44)
AST: 28 IU/L (ref 0–40)
Albumin/Globulin Ratio: 1.9 (ref 1.2–2.2)
Albumin: 4.8 g/dL (ref 3.9–4.9)
Alkaline Phosphatase: 106 IU/L (ref 44–121)
BUN/Creatinine Ratio: 14 (ref 10–24)
BUN: 16 mg/dL (ref 8–27)
Bilirubin Total: 0.6 mg/dL (ref 0.0–1.2)
CO2: 24 mmol/L (ref 20–29)
Calcium: 10.3 mg/dL — ABNORMAL HIGH (ref 8.6–10.2)
Chloride: 97 mmol/L (ref 96–106)
Creatinine, Ser: 1.12 mg/dL (ref 0.76–1.27)
Globulin, Total: 2.5 g/dL (ref 1.5–4.5)
Glucose: 123 mg/dL — ABNORMAL HIGH (ref 70–99)
Potassium: 4.4 mmol/L (ref 3.5–5.2)
Sodium: 140 mmol/L (ref 134–144)
Total Protein: 7.3 g/dL (ref 6.0–8.5)
eGFR: 72 mL/min/{1.73_m2} (ref >59)

## 2022-07-16 LAB — LIPID PANEL
Chol/HDL Ratio: 5.3 ratio — ABNORMAL HIGH (ref 0.0–5.0)
Cholesterol, Total: 222 mg/dL — ABNORMAL HIGH (ref 100–199)
HDL: 42 mg/dL (ref >39)
LDL Chol Calc (NIH): 144 mg/dL — ABNORMAL HIGH (ref 0–99)
Triglycerides: 201 mg/dL — ABNORMAL HIGH (ref 0–149)
VLDL Cholesterol Cal: 36 mg/dL (ref 5–40)

## 2022-07-16 LAB — PSA: Prostate Specific Ag, Serum: 0.6 ng/mL (ref 0.0–4.0)

## 2022-07-16 LAB — TSH: TSH: 2.23 u[IU]/mL (ref 0.450–4.500)

## 2022-07-24 ENCOUNTER — Ambulatory Visit (INDEPENDENT_AMBULATORY_CARE_PROVIDER_SITE_OTHER): Payer: Medicare Other | Admitting: Nurse Practitioner

## 2022-07-24 ENCOUNTER — Encounter: Payer: Self-pay | Admitting: Nurse Practitioner

## 2022-07-24 VITALS — BP 103/59 | HR 83 | Temp 97.5°F | Resp 20 | Ht 68.0 in | Wt 232.0 lb

## 2022-07-24 DIAGNOSIS — R051 Acute cough: Secondary | ICD-10-CM | POA: Diagnosis not present

## 2022-07-24 MED ORDER — PROMETHAZINE-DM 6.25-15 MG/5ML PO SYRP: 5.0000 mL | ORAL_SOLUTION | Freq: Four times a day (QID) | ORAL | 0 refills | Status: DC | PRN

## 2022-07-24 NOTE — Progress Notes (Signed)
   Subjective:    Patient ID: Derek Ryan, male    DOB: Aug 03, 1956, 67 y.o.   MRN: [id number]   Chief Complaint: uri  URI  The current episode started in the past 7 days. The problem has been gradually improving. There has been no fever. Associated symptoms include congestion, coughing, headaches and rhinorrhea. Pertinent negatives include no sore throat. Treatments tried: nyquil and zinc. The treatment provided mild relief.       Review of Systems  Constitutional:  Negative for chills and fever.  HENT:  Positive for congestion and rhinorrhea. Negative for sore throat.   Respiratory:  Positive for cough.   Neurological:  Positive for headaches.       Objective:   Physical Exam Constitutional:      Appearance: Normal appearance.  HENT:     Right Ear: Tympanic membrane normal.     Left Ear: Tympanic membrane normal.  Cardiovascular:     Rate and Rhythm: Normal rate and regular rhythm.     Heart sounds: Normal heart sounds.  Pulmonary:     Effort: Pulmonary effort is normal.     Breath sounds: Normal breath sounds.  Skin:    General: Skin is warm.  Neurological:     General: No focal deficit present.     Mental Status: He is alert and oriented to person, place, and time.  Psychiatric:        Mood and Affect: Mood normal.        Behavior: Behavior normal.       BP (!) 103/59   Pulse 83   Temp (!) 97.5 F (36.4 C) (Temporal)   Resp 20   Ht '5\' 8"'$  (1.727 m)   Wt 232 lb (105.2 kg)   SpO2 96%   BMI 35.28 kg/m      Assessment & Plan:   Derek Ryan in today with chief complaint of URI   1. Acute cough 1. Take meds as prescribed 2. Use a cool mist humidifier especially during the winter months and when heat has been humid. 3. Use saline nose sprays frequently 4. Saline irrigations of the nose can be very helpful if Ryan frequently.  * 4X daily for 1 week*  * Use of a nettie pot can be helpful with this. Follow directions with this* 5. Drink plenty of  fluids 6. Keep thermostat turn down low 7.For any cough or congestion- promethazine DM 8. For fever or aces or pains- take tylenol or ibuprofen appropriate for age and weight.  * for fevers greater than 101 orally you may alternate ibuprofen and tylenol every  3 hours.    - promethazine-dextromethorphan (PROMETHAZINE-DM) 6.25-15 MG/5ML syrup; Take 5 mLs by mouth 4 (four) times daily as needed for cough.  Dispense: 118 mL; Refill: 0 - Novel Coronavirus, NAA ([facility])    The above assessment and management plan was discussed with the patient. The patient verbalized understanding of and has agreed to the management plan. Patient is aware to call the clinic if symptoms persist or worsen. Patient is aware when to return to the clinic for a follow-up visit. Patient educated on when it is appropriate to go to the emergency department.   Derek Hassell Done, FNP

## 2022-07-24 NOTE — Patient Instructions (Signed)

## 2022-07-25 LAB — NOVEL CORONAVIRUS, NAA: SARS-CoV-2, NAA: DETECTED — AB

## 2022-08-01 DIAGNOSIS — Z4509 Encounter for adjustment and management of other cardiac device: Secondary | ICD-10-CM | POA: Diagnosis not present

## 2022-08-01 DIAGNOSIS — I639 Cerebral infarction, unspecified: Secondary | ICD-10-CM | POA: Diagnosis not present

## 2022-08-01 DIAGNOSIS — Z95818 Presence of other cardiac implants and grafts: Secondary | ICD-10-CM | POA: Diagnosis not present

## 2022-09-01 DIAGNOSIS — I639 Cerebral infarction, unspecified: Secondary | ICD-10-CM | POA: Diagnosis not present

## 2022-09-01 DIAGNOSIS — Z95818 Presence of other cardiac implants and grafts: Secondary | ICD-10-CM | POA: Diagnosis not present

## 2022-09-01 DIAGNOSIS — Z4509 Encounter for adjustment and management of other cardiac device: Secondary | ICD-10-CM | POA: Diagnosis not present

## 2022-09-24 ENCOUNTER — Ambulatory Visit (INDEPENDENT_AMBULATORY_CARE_PROVIDER_SITE_OTHER): Payer: Medicare Other | Admitting: Family Medicine

## 2022-09-24 ENCOUNTER — Encounter: Payer: Self-pay | Admitting: Family Medicine

## 2022-09-24 VITALS — BP 132/62 | HR 85 | Temp 97.7°F | Ht 68.0 in | Wt 237.6 lb

## 2022-09-24 DIAGNOSIS — L282 Other prurigo: Secondary | ICD-10-CM

## 2022-09-24 MED ORDER — METHYLPREDNISOLONE ACETATE 40 MG/ML IJ SUSP
40.0000 mg | Freq: Once | INTRAMUSCULAR | Status: AC
  Administered 2022-09-24: 40 mg via INTRAMUSCULAR

## 2022-09-24 MED ORDER — HYDROXYZINE PAMOATE 25 MG PO CAPS
25.0000 mg | ORAL_CAPSULE | Freq: Three times a day (TID) | ORAL | 0 refills | Status: DC | PRN
Start: 2022-09-24 — End: 2023-07-14

## 2022-09-24 MED ORDER — PREDNISONE 20 MG PO TABS
40.0000 mg | ORAL_TABLET | Freq: Every day | ORAL | 0 refills | Status: AC
Start: 2022-09-24 — End: 2022-09-29

## 2022-09-24 NOTE — Progress Notes (Signed)
Subjective:  Patient ID: Derek Ryan, male    DOB: 01-15-1956, 67 y.o.   MRN: [id number]  Patient Care Team: Janora Norlander, DO as PCP - General (Family Medicine)   Chief Complaint:  Foot Swelling (Bilateral x 1 week/) and Rash (Abdomen & bilateral legs x 1 week- itching )   HPI: Derek Ryan is a 67 y.o. male presenting on 09/24/2022 for Foot Swelling (Bilateral x 1 week/) and Rash (Abdomen & bilateral legs x 1 week- itching )   Rash This is a new problem. The current episode started in the past 7 days. The problem has been waxing and waning since onset. The affected locations include the abdomen, left ankle, right ankle, right lower leg and left lower leg. The rash is characterized by itchiness, redness and swelling. It is unknown (does have animals in the home) if there was an exposure to a precipitant. Pertinent negatives include no anorexia, congestion, cough, diarrhea, eye pain, facial edema, fatigue, fever, joint pain, nail changes, rhinorrhea, shortness of breath, sore throat or vomiting. Past treatments include anti-itch cream. The treatment provided no relief.     Relevant past medical, surgical, family, and social history reviewed and updated as indicated.  Allergies and medications reviewed and updated. Data reviewed: Chart in Epic.   Past Medical History:  Diagnosis Date   Central retinal artery occlusion of left eye    Chronic diastolic heart failure ([place]) 08/25/2019   Cryptogenic stroke ([place]) 05/26/2021   Depression    GERD (gastroesophageal reflux disease)    Hyperlipidemia    Hypertension    Loop recorder Biomonitor-Biotronik 05/25/2021 05/26/2021   LVH (left ventricular hypertrophy) due to hypertensive disease 08/25/2019   OSA (obstructive sleep apnea)    unable to tolerate CPAP   Retinal artery branch occlusion, 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    Past Surgical History:  Procedure Laterality Date   COLONOSCOPY      Social History   Socioeconomic History    Marital status: Widowed    Spouse name: Not on file   Number of children: 3   Years of education: Not on file   Highest education level: Not on file  Occupational History   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   Works at [employer] Part time.    Social Determinants of Health   Financial Resource Strain: Low Risk  (06/21/2022)   Overall Financial Resource Strain (CARDIA)    Difficulty of Paying Living Expenses: Not hard at all  Food Insecurity: No Food Insecurity (06/21/2022)   Hunger Vital Sign    Worried About Running Out of Food in the Last Year: Never true    Ran Out of Food in the Last Year: Never true  Transportation Needs: No Transportation Needs (06/21/2022)   PRAPARE - Hydrologist (Medical): No  Lack of Transportation (Non-Medical): No  Physical Activity: Insufficiently Active (06/21/2022)   Exercise Vital Sign    Days of Exercise per Week: 3 days    Minutes of Exercise per Session: 30 min  Stress: No Stress Concern Present (06/21/2022)   [place]    Feeling of Stress : Not at all  Social Connections: Socially Isolated (06/21/2022)   Social Connection and Isolation Panel [NHANES]    Frequency of Communication with Friends and Family: More than three times a week    Frequency of Social Gatherings with Friends and Family: More than three times a week    Attends Religious Services: Never     Marine scientist or Organizations: No    Attends Archivist Meetings: Never    Marital Status: Widowed  Intimate Partner Violence: Not At Risk (06/21/2022)   Humiliation, Afraid, Rape, and Kick questionnaire    Fear of Current or Ex-Partner: No    Emotionally Abused: No    Physically Abused: No    Sexually Abused: No    Outpatient Encounter Medications as of 09/24/2022  Medication Sig   aspirin EC 81 MG tablet Take 81 mg by mouth daily.   atorvastatin (LIPITOR) 10 MG tablet TAKE 1 TABLET BY MOUTH EVERYDAY AT BEDTIME   busPIRone (BUSPAR) 10 MG tablet TAKE 1 TABLET (10 MG TOTAL) BY MOUTH 3 (THREE) TIMES DAILY. FOR ANXIETY   cholecalciferol (VITAMIN D3) 25 MCG (1000 UT) tablet Take 2,000 Units by mouth daily.   escitalopram (LEXAPRO) 20 MG tablet Take 1 tablet (20 mg total) by mouth daily.   hydrOXYzine (VISTARIL) 25 MG capsule Take 1 capsule (25 mg total) by mouth every 8 (eight) hours as needed.   losartan-hydrochlorothiazide (HYZAAR) 50-12.5 MG tablet TAKE 1 TABLET BY MOUTH EVERY DAY   nitroGLYCERIN (NITROSTAT) 0.4 MG SL tablet Place 1 tablet (0.4 mg total) under the tongue every 5 (five) minutes as needed for chest pain (then call 911).   Omega-3 Fatty Acids (FISH OIL) 1000 MG CAPS Take by mouth.   predniSONE (DELTASONE) 20 MG tablet Take 2 tablets (40 mg total) by mouth daily with breakfast for 5 days.   vitamin C (ASCORBIC ACID) 500 MG tablet Take 500 mg by mouth daily.   [DISCONTINUED] promethazine-dextromethorphan (PROMETHAZINE-DM) 6.25-15 MG/5ML syrup Take 5 mLs by mouth 4 (four) times daily as needed for cough.   [EXPIRED] methylPREDNISolone acetate (DEPO-MEDROL) injection 40 mg    No facility-administered encounter medications on file as of 09/24/2022.    Allergies  Allergen Reactions   Doxycycline Itching    As of 03/31/20, pt does not recall this reaction    Review of Systems  Constitutional:  Negative for activity change, appetite change, chills, diaphoresis,  fatigue, fever and unexpected weight change.  HENT: Negative.  Negative for congestion, rhinorrhea and sore throat.   Eyes: Negative.  Negative for photophobia, pain and visual disturbance.  Respiratory:  Negative for cough, chest tightness and shortness of breath.   Cardiovascular:  Negative for chest pain, palpitations and leg swelling.  Gastrointestinal:  Negative for abdominal pain, anorexia, blood in stool, constipation, diarrhea, nausea and vomiting.  Endocrine: Negative.  Negative for polydipsia, polyphagia and polyuria.  Genitourinary:  Negative for decreased urine volume, difficulty urinating, dysuria, frequency and urgency.  Musculoskeletal:  Negative for arthralgias, joint pain and myalgias.  Skin:  Positive for color change and rash. Negative for nail changes, pallor and wound.  Allergic/Immunologic: Negative.  Neurological:  Negative for dizziness, tremors, seizures, syncope, facial asymmetry, speech difficulty, weakness, light-headedness, numbness and headaches.  Hematological: Negative.   Psychiatric/Behavioral:  Negative for confusion, hallucinations, sleep disturbance and suicidal ideas.   All other systems reviewed and are negative.       Objective:  BP 132/62   Pulse 85   Temp 97.7 F (36.5 C) (Temporal)   Ht '5\' 8"'$  (1.727 m)   Wt 237 lb 9.6 oz (107.8 kg)   SpO2 94%   BMI 36.13 kg/m    Wt Readings from Last 3 Encounters:  09/24/22 237 lb 9.6 oz (107.8 kg)  07/24/22 232 lb (105.2 kg)  07/15/22 235 lb 3.2 oz (106.7 kg)    Physical Exam Vitals and nursing note reviewed.  Constitutional:      General: He is not in acute distress.    Appearance: Normal appearance. He is well-developed and well-groomed. He is obese. He is not ill-appearing, toxic-appearing or diaphoretic.  HENT:     Head: Normocephalic and atraumatic.     Jaw: There is normal jaw occlusion.     Right Ear: Hearing normal.     Left Ear: Hearing normal.     Nose: Nose normal.     Mouth/Throat:      Lips: Pink.     Mouth: Mucous membranes are moist.     Pharynx: Oropharynx is clear. Uvula midline.  Eyes:     General: Lids are normal.     Extraocular Movements: Extraocular movements intact.     Conjunctiva/sclera: Conjunctivae normal.     Pupils: Pupils are equal, round, and reactive to light.  Neck:     Thyroid: No thyroid mass, thyromegaly or thyroid tenderness.     Vascular: No carotid bruit or JVD.     Trachea: Trachea and phonation normal.  Cardiovascular:     Rate and Rhythm: Normal rate and regular rhythm.     Chest Wall: PMI is not displaced.     Pulses: Normal pulses.     Heart sounds: Normal heart sounds.  Pulmonary:     Effort: Pulmonary effort is normal.     Breath sounds: Normal breath sounds.  Abdominal:     General: There is no abdominal bruit.     Palpations: There is no hepatomegaly or splenomegaly.  Musculoskeletal:        General: Normal range of motion.     Cervical back: Normal range of motion and neck supple.     Right lower leg: No edema.     Left lower leg: No edema.  Lymphadenopathy:     Cervical: No cervical adenopathy.  Skin:    General: Skin is warm and dry.     Capillary Refill: Capillary refill takes less than 2 seconds.     Coloration: Skin is not cyanotic, jaundiced or pale.     Findings: Erythema and rash present. Rash is macular and papular.     Comments: Scattered erythematous papular/macular rash to bilateral ankles spreading up legs. Few to belt line. No surrounding erythema. Slight swelling to ankles at site of rash.  Neurological:     General: No focal deficit present.     Mental Status: He is alert and oriented to person, place, and time.     Sensory: Sensation is intact.     Motor: Motor function is intact.     Coordination: Coordination is intact.     Gait: Gait is intact.     Deep Tendon Reflexes: Reflexes are normal and  symmetric.  Psychiatric:        Attention and Perception: Attention and perception normal.         Mood and Affect: Mood and affect normal.        Speech: Speech normal.        Behavior: Behavior normal. Behavior is cooperative.        Thought Content: Thought content normal.        Cognition and Memory: Cognition and memory normal.        Judgment: Judgment normal.     Results for orders placed or performed in visit on 07/24/22  Novel Coronavirus, NAA ([facility])   Specimen: Nasopharyngeal(NP) swabs in vial transport medium  Result Value Ref Range   SARS-CoV-2, NAA Detected (A) Not Detected       Pertinent labs & imaging results that were available during my care of the patient were reviewed by me and considered in my medical decision making.  Assessment & Plan:  Josuha was seen today for foot swelling and rash.  Diagnoses and all orders for this visit:  Pruritic rash Concerning for flea bites or bed bug bites. Will treat with below. Pt aware to check mattress at home for bed bugs and treat if warranted. Treat animals and house for fleas. If symptoms persist or worsen, follow up.  -     hydrOXYzine (VISTARIL) 25 MG capsule; Take 1 capsule (25 mg total) by mouth every 8 (eight) hours as needed. -     predniSONE (DELTASONE) 20 MG tablet; Take 2 tablets (40 mg total) by mouth daily with breakfast for 5 days. -     methylPREDNISolone acetate (DEPO-MEDROL) injection 40 mg     Continue all other maintenance medications.  Follow up plan: Return if symptoms worsen or fail to improve.   Continue healthy lifestyle choices, including diet (rich in fruits, vegetables, and lean proteins, and low in salt and simple carbohydrates) and exercise (at least 30 minutes of moderate physical activity daily).  Educational handout given for rash  The above assessment and management plan was discussed with the patient. The patient verbalized understanding of and has agreed to the management plan. Patient is aware to call the clinic if they develop any new symptoms or if symptoms persist or worsen.  Patient is aware when to return to the clinic for a follow-up visit. Patient educated on when it is appropriate to go to the emergency department.   Monia Pouch, FNP-C [facility] [phone]

## 2022-10-02 DIAGNOSIS — Z95818 Presence of other cardiac implants and grafts: Secondary | ICD-10-CM | POA: Diagnosis not present

## 2022-10-02 DIAGNOSIS — Z4509 Encounter for adjustment and management of other cardiac device: Secondary | ICD-10-CM | POA: Diagnosis not present

## 2022-10-02 DIAGNOSIS — I639 Cerebral infarction, unspecified: Secondary | ICD-10-CM | POA: Diagnosis not present

## 2022-11-02 DIAGNOSIS — Z95818 Presence of other cardiac implants and grafts: Secondary | ICD-10-CM | POA: Diagnosis not present

## 2022-11-02 DIAGNOSIS — I639 Cerebral infarction, unspecified: Secondary | ICD-10-CM | POA: Diagnosis not present

## 2022-11-02 DIAGNOSIS — Z4509 Encounter for adjustment and management of other cardiac device: Secondary | ICD-10-CM | POA: Diagnosis not present

## 2022-12-03 DIAGNOSIS — Z4509 Encounter for adjustment and management of other cardiac device: Secondary | ICD-10-CM | POA: Diagnosis not present

## 2022-12-03 DIAGNOSIS — I639 Cerebral infarction, unspecified: Secondary | ICD-10-CM | POA: Diagnosis not present

## 2022-12-03 DIAGNOSIS — Z95818 Presence of other cardiac implants and grafts: Secondary | ICD-10-CM | POA: Diagnosis not present

## 2022-12-11 ENCOUNTER — Ambulatory Visit (INDEPENDENT_AMBULATORY_CARE_PROVIDER_SITE_OTHER): Payer: Medicare Other | Admitting: Family Medicine

## 2022-12-11 ENCOUNTER — Encounter: Payer: Self-pay | Admitting: Family Medicine

## 2022-12-11 VITALS — BP 117/66 | HR 64 | Temp 96.9°F | Ht 68.0 in | Wt 234.4 lb

## 2022-12-11 DIAGNOSIS — M47816 Spondylosis without myelopathy or radiculopathy, lumbar region: Secondary | ICD-10-CM | POA: Diagnosis not present

## 2022-12-11 DIAGNOSIS — M545 Low back pain, unspecified: Secondary | ICD-10-CM

## 2022-12-11 DIAGNOSIS — G8929 Other chronic pain: Secondary | ICD-10-CM

## 2022-12-11 MED ORDER — MELOXICAM 15 MG PO TABS: 15.0000 mg | ORAL_TABLET | Freq: Every day | ORAL | 0 refills | Status: DC

## 2022-12-11 MED ORDER — BETAMETHASONE SOD PHOS & ACET 6 (3-3) MG/ML IJ SUSP
6.0000 mg | Freq: Once | INTRAMUSCULAR | Status: AC
  Administered 2022-12-11: 6 mg via INTRAMUSCULAR

## 2022-12-11 MED ORDER — TIZANIDINE HCL 4 MG PO TABS: 4.0000 mg | ORAL_TABLET | Freq: Four times a day (QID) | ORAL | 1 refills | Status: DC | PRN
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
## 2022-12-24 DIAGNOSIS — M25551 Pain in right hip: Secondary | ICD-10-CM | POA: Diagnosis not present

## 2022-12-24 DIAGNOSIS — M25552 Pain in left hip: Secondary | ICD-10-CM | POA: Diagnosis not present

## 2022-12-24 DIAGNOSIS — M5442 Lumbago with sciatica, left side: Secondary | ICD-10-CM | POA: Diagnosis not present

## 2023-01-01 ENCOUNTER — Telehealth: Payer: Self-pay | Admitting: Family Medicine

## 2023-01-01 NOTE — Telephone Encounter (Signed)
Contacted [name] to schedule their annual wellness visit. Appointment made for 06/23/2023.  Thank you,  Judeth Cornfield,  AMB Clinical Support [facility] Program Direct Dial ??[phone]  )*

## 2023-01-03 DIAGNOSIS — Z4509 Encounter for adjustment and management of other cardiac device: Secondary | ICD-10-CM | POA: Diagnosis not present

## 2023-01-03 DIAGNOSIS — Z95818 Presence of other cardiac implants and grafts: Secondary | ICD-10-CM | POA: Diagnosis not present

## 2023-01-03 DIAGNOSIS — I639 Cerebral infarction, unspecified: Secondary | ICD-10-CM | POA: Diagnosis not present

## 2023-01-07 ENCOUNTER — Other Ambulatory Visit: Payer: Self-pay | Admitting: Family Medicine

## 2023-01-07 DIAGNOSIS — M5442 Lumbago with sciatica, left side: Secondary | ICD-10-CM | POA: Diagnosis not present

## 2023-01-07 DIAGNOSIS — M47816 Spondylosis without myelopathy or radiculopathy, lumbar region: Secondary | ICD-10-CM | POA: Diagnosis not present

## 2023-01-07 DIAGNOSIS — M5417 Radiculopathy, lumbosacral region: Secondary | ICD-10-CM | POA: Diagnosis not present

## 2023-01-31 ENCOUNTER — Encounter: Payer: Self-pay | Admitting: Family Medicine

## 2023-01-31 ENCOUNTER — Ambulatory Visit (INDEPENDENT_AMBULATORY_CARE_PROVIDER_SITE_OTHER): Payer: Medicare Other | Admitting: Family Medicine

## 2023-01-31 VITALS — BP 125/70 | HR 78 | Temp 98.8°F | Ht 68.0 in | Wt 235.0 lb

## 2023-01-31 DIAGNOSIS — E785 Hyperlipidemia, unspecified: Secondary | ICD-10-CM | POA: Diagnosis not present

## 2023-01-31 DIAGNOSIS — I110 Hypertensive heart disease with heart failure: Secondary | ICD-10-CM | POA: Diagnosis not present

## 2023-01-31 DIAGNOSIS — I5032 Chronic diastolic (congestive) heart failure: Secondary | ICD-10-CM | POA: Diagnosis not present

## 2023-01-31 DIAGNOSIS — F411 Generalized anxiety disorder: Secondary | ICD-10-CM

## 2023-01-31 DIAGNOSIS — M47816 Spondylosis without myelopathy or radiculopathy, lumbar region: Secondary | ICD-10-CM | POA: Diagnosis not present

## 2023-01-31 DIAGNOSIS — I700 Atherosclerosis of aorta: Secondary | ICD-10-CM | POA: Diagnosis not present

## 2023-01-31 DIAGNOSIS — I10 Essential (primary) hypertension: Secondary | ICD-10-CM

## 2023-01-31 DIAGNOSIS — F324 Major depressive disorder, single episode, in partial remission: Secondary | ICD-10-CM

## 2023-02-01 MED ORDER — ESCITALOPRAM OXALATE 20 MG PO TABS: 20.0000 mg | ORAL_TABLET | Freq: Every day | ORAL | 3 refills | Status: DC

## 2023-02-01 MED ORDER — ATORVASTATIN CALCIUM 10 MG PO TABS: ORAL_TABLET | ORAL | 3 refills | Status: DC

## 2023-02-01 MED ORDER — NITROGLYCERIN 0.4 MG SL SUBL: 0.4000 mg | SUBLINGUAL_TABLET | SUBLINGUAL | 1 refills | Status: DC | PRN

## 2023-02-01 MED ORDER — LOSARTAN POTASSIUM-HCTZ 50-12.5 MG PO TABS: 1.0000 | ORAL_TABLET | Freq: Every day | ORAL | 3 refills | Status: DC

## 2023-02-01 MED ORDER — BUSPIRONE HCL 10 MG PO TABS: ORAL_TABLET | ORAL | 3 refills | Status: DC

## 2023-02-01 NOTE — Progress Notes (Signed)
Subjective: CC: Follow-up back and hip pain PCP: Raliegh Ip, DO RSW:NIOEV L Bottorff is a 67 y.o. male presenting to clinic today for:  1.  Back and hip pain Patient reports that he has seen the spinal specialist and will be seeing them soon again to review the MRI result and next steps.  He is under the care of Dr. Franky Macho.  He reports ongoing discomfort but overall seems to be coping independently.  He discontinue the tizanidine and meloxicam that were prescribed by another provider as they had not been especially helpful.  He was not aware that meloxicam could cause issues with his underlying heart disease.  2.  Hypertension, hyperlipidemia He is compliant with all medications.  Would like refills on all meds sent to [facility].  No chest pain, shortness of breath, dizziness or edema  3.  Mood Patient reports anxiety is under good control with BuSpar and Lexapro.  No changes needed.   ROS: Per HPI  Allergies  Allergen Reactions   Doxycycline Itching    As of 03/31/20, pt does not recall this reaction   Past Medical History:  Diagnosis Date   Central retinal artery occlusion of left eye    Chronic diastolic heart failure (HCC) 08/25/2019   Cryptogenic stroke (HCC) 05/26/2021   Depression    GERD (gastroesophageal reflux disease)    Hyperlipidemia    Hypertension    Loop recorder Biomonitor-Biotronik 05/25/2021 05/26/2021   LVH (left ventricular hypertrophy) due to hypertensive disease 08/25/2019   OSA (obstructive sleep apnea)    unable to tolerate CPAP   Retinal artery branch occlusion, 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    Current Outpatient Medications:    aspirin EC 81 MG tablet, Take 81 mg by mouth daily., Disp: , Rfl:    atorvastatin (LIPITOR) 10 MG tablet, TAKE 1 TABLET BY MOUTH EVERYDAY AT BEDTIME, Disp: 90 tablet, Rfl: 3   busPIRone (BUSPAR) 10 MG tablet, TAKE 1 TABLET (10 MG TOTAL) BY MOUTH 3 (THREE) TIMES DAILY. FOR ANXIETY, Disp: 270 tablet, Rfl: 3   cholecalciferol  (VITAMIN D3) 25 MCG (1000 UT) tablet, Take 2,000 Units by mouth daily., Disp: , Rfl:    escitalopram (LEXAPRO) 20 MG tablet, Take 1 tablet (20 mg total) by mouth daily., Disp: 90 tablet, Rfl: 3   hydrOXYzine (VISTARIL) 25 MG capsule, Take 1 capsule (25 mg total) by mouth every 8 (eight) hours as needed., Disp: 30 capsule, Rfl: 0   losartan-hydrochlorothiazide (HYZAAR) 50-12.5 MG tablet, TAKE 1 TABLET BY MOUTH EVERY DAY, Disp: 90 tablet, Rfl: 2   meloxicam (MOBIC) 15 MG tablet, TAKE 1 TABLET (15 MG TOTAL) BY MOUTH DAILY. FOR JOINT AND MUSCLE PAIN, Disp: 30 tablet, Rfl: 0   nitroGLYCERIN (NITROSTAT) 0.4 MG SL tablet, Place 1 tablet (0.4 mg total) under the tongue every 5 (five) minutes as needed for chest pain (then call 911)., Disp: 25 tablet, Rfl: 1   Omega-3 Fatty Acids (FISH OIL) 1000 MG CAPS, Take by mouth., Disp: , Rfl:    tiZANidine (ZANAFLEX) 4 MG tablet, Take 1 tablet (4 mg total) by mouth every 6 (six) hours as needed for muscle spasms., Disp: 30 tablet, Rfl: 1   vitamin C (ASCORBIC ACID) 500 MG tablet, Take 500 mg by mouth daily., Disp: , Rfl:  Social History   Socioeconomic History   Marital status: Widowed    Spouse name: Not on file   Number of children: 3   Years of education: Not on file   Highest education  level: Not on file  Occupational History   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   Works at [employer] Part time.    Social Determinants of Health    Financial Resource Strain: Low Risk  (06/21/2022)   Overall Financial Resource Strain (CARDIA)    Difficulty of Paying Living Expenses: Not hard at all  Food Insecurity: No Food Insecurity (06/21/2022)   Hunger Vital Sign    Worried About Running Out of Food in the Last Year: Never true    Ran Out of Food in the Last Year: Never true  Transportation Needs: No Transportation Needs (06/21/2022)   PRAPARE - Administrator, Civil Service (Medical): No    Lack of Transportation (Non-Medical): No  Physical Activity: Insufficiently Active (06/21/2022)   Exercise Vital Sign    Days of Exercise per Week: 3 days    Minutes of Exercise per Session: 30 min  Stress: No Stress Concern Present (06/21/2022)   [employer] of Occupational Health - Occupational Stress Questionnaire    Feeling of Stress : Not at all  Social Connections: Socially Isolated (06/21/2022)   Social Connection and Isolation Panel [NHANES]    Frequency of Communication with Friends and Family: More than three times a week    Frequency of Social Gatherings with Friends and Family: More than three times a week    Attends Religious Services: Never    Database administrator or Organizations: No    Attends Banker Meetings: Never    Marital Status: Widowed  Intimate Partner Violence: Not At Risk (06/21/2022)   Humiliation, Afraid, Rape, and Kick questionnaire    Fear of Current or Ex-Partner: No    Emotionally Abused: No    Physically Abused: No    Sexually Abused: No   Family History  Problem Relation Age of Onset   Heart attack Mother    Alcohol abuse Father    Stomach cancer Sister    Hypertension Brother    Heart attack Brother    Brain cancer Brother    Heart attack Nephew 49       deceased from massive MI    Objective: Office vital signs reviewed. BP 125/70   Pulse 78   Temp 98.8 F (37.1 C)   Ht 5\' 8"  (1.727 m)   Wt 235 lb (106.6 kg)   SpO2 92%   BMI 35.73 kg/m   Physical  Examination:  General: Awake, alert, obese, No acute distress HEENT: Sclera white.  Moist mucous membranes Cardio: regular rate and rhythm, S1S2 heard, no murmurs appreciated Pulm: clear to auscultation bilaterally, no wheezes, rhonchi or rales; normal work of breathing on room air MSK: Antalgic gait and station.  Independent gait Psych: Mood stable, speech normal, affect appropriate.  Very pleasant, interactive    12/11/2022  8:28 AM 12/11/2022    8:21 AM 09/24/2022   10:51 AM  Depression screen PHQ 2/9  Decreased Interest 1 0 1  Down, Depressed, Hopeless 1 0 0  PHQ - 2 Score 2 0 1  Altered sleeping 1  1  Tired, decreased energy 2  1  Change in appetite 2  1  Feeling bad or failure about yourself  1  0  Trouble concentrating 1  1  Moving slowly or fidgety/restless 0  1  Suicidal thoughts 0  0  PHQ-9 Score 9  6  Difficult doing work/chores Somewhat difficult        12/11/2022    8:28 AM 09/24/2022   10:51 AM 07/24/2022    1:47 PM 07/15/2022    9:03 AM  GAD 7 : Generalized Anxiety Score  Nervous, Anxious, on Edge 1 1 0 1  Control/stop worrying 1 1 0 1  Worry too much - different things 1 1 0 1  Trouble relaxing 1 1 0 1  Restless 1 1 0 0  Easily annoyed or irritable 1 1 0 0  Afraid - awful might happen 1 1 0 1  Total GAD 7 Score 7 7 0 5  Anxiety Difficulty Somewhat difficult  Not difficult at all Somewhat difficult    Assessment/ Plan: 66 y.o. male   Lumbar spondylosis  Essential hypertension - Plan: losartan-hydrochlorothiazide (HYZAAR) 50-12.5 MG tablet  Hypertensive heart disease with chronic diastolic congestive heart failure (HCC) - Plan: losartan-hydrochlorothiazide (HYZAAR) 50-12.5 MG tablet, nitroGLYCERIN (NITROSTAT) 0.4 MG SL tablet  Aortic atherosclerosis (HCC) - Plan: atorvastatin (LIPITOR) 10 MG tablet  Dyslipidemia - Plan: atorvastatin (LIPITOR) 10 MG tablet  Major depressive disorder with single episode, in partial remission (HCC) - Plan: escitalopram  (LEXAPRO) 20 MG tablet  GAD (generalized anxiety disorder) - Plan: busPIRone (BUSPAR) 10 MG tablet, escitalopram (LEXAPRO) 20 MG tablet  We discussed that if any surgical interventions are planned that he should seek out cardiac clearance from Dr. Jacinto Halim and if needing medical clearance from my standpoint I am glad to also coordinate that.  He understands to call our office ASAP as soon as he has a plan to that we can arrange this for him.  In the meantime, blood pressures appear to be well-controlled.  I have renewed all medications that appear to be upcoming for refills.  Not yet due for fasting labs.  Continue statin  Lexapro and BuSpar renewed.  His symptoms are chronic and stable   No orders of the defined types were placed in this encounter.  No orders of the defined types were placed in this encounter.    Raliegh Ip, DO [facility] [phone]

## 2023-02-03 DIAGNOSIS — I639 Cerebral infarction, unspecified: Secondary | ICD-10-CM | POA: Diagnosis not present

## 2023-02-03 DIAGNOSIS — Z4509 Encounter for adjustment and management of other cardiac device: Secondary | ICD-10-CM | POA: Diagnosis not present

## 2023-02-03 DIAGNOSIS — M25552 Pain in left hip: Secondary | ICD-10-CM | POA: Diagnosis not present

## 2023-02-03 DIAGNOSIS — M5442 Lumbago with sciatica, left side: Secondary | ICD-10-CM | POA: Diagnosis not present

## 2023-02-03 DIAGNOSIS — Z95818 Presence of other cardiac implants and grafts: Secondary | ICD-10-CM | POA: Diagnosis not present

## 2023-03-06 DIAGNOSIS — Z95818 Presence of other cardiac implants and grafts: Secondary | ICD-10-CM | POA: Diagnosis not present

## 2023-03-06 DIAGNOSIS — Z4509 Encounter for adjustment and management of other cardiac device: Secondary | ICD-10-CM | POA: Diagnosis not present

## 2023-03-06 DIAGNOSIS — I639 Cerebral infarction, unspecified: Secondary | ICD-10-CM | POA: Diagnosis not present

## 2023-03-20 ENCOUNTER — Telehealth: Payer: Self-pay | Admitting: Family Medicine

## 2023-03-20 DIAGNOSIS — M47816 Spondylosis without myelopathy or radiculopathy, lumbar region: Secondary | ICD-10-CM

## 2023-03-20 DIAGNOSIS — M545 Low back pain, unspecified: Secondary | ICD-10-CM

## 2023-03-20 NOTE — Telephone Encounter (Signed)
Pt needs referral to see Ortho for MRI. Says the MRI he had done could only be done for the lower back and arthritis was noted. Pt said he was told that in order to get MRI of left hip, he would need to be referred by PCP to Ortho for it.

## 2023-03-21 NOTE — Telephone Encounter (Signed)
Referral placed.

## 2023-03-21 NOTE — Telephone Encounter (Signed)
Pt wants to be referred to Sela Hilding, MD in [place] HY#[phone] Fax#[phone]

## 2023-03-21 NOTE — Telephone Encounter (Signed)
Pt ok for referral. He will have to call back with the name of the office he would like to be referred to.

## 2023-03-21 NOTE — Telephone Encounter (Signed)
Glad to place. Does he have a preference for ortho office?  [place]? [place]?

## 2023-03-24 ENCOUNTER — Telehealth: Payer: Self-pay | Admitting: Family Medicine

## 2023-03-24 NOTE — Telephone Encounter (Signed)
The note said to refer to ortho for MRI, this was done.

## 2023-03-24 NOTE — Telephone Encounter (Signed)
Patient stated that he needed a scan of his left hip. York Spaniel he went to a location on [address] in [place] when he had a scan of his back. He thought they were going to do both at the same time but they did not. Please call back and advise.

## 2023-03-25 NOTE — Telephone Encounter (Signed)
This needs to be sent to referral coordinator. I've rerouted message to [employer]

## 2023-03-27 NOTE — Telephone Encounter (Signed)
Referral sent to requested Office as Patient requested.

## 2023-04-06 DIAGNOSIS — Z95818 Presence of other cardiac implants and grafts: Secondary | ICD-10-CM | POA: Diagnosis not present

## 2023-04-06 DIAGNOSIS — I639 Cerebral infarction, unspecified: Secondary | ICD-10-CM | POA: Diagnosis not present

## 2023-04-06 DIAGNOSIS — Z4509 Encounter for adjustment and management of other cardiac device: Secondary | ICD-10-CM | POA: Diagnosis not present

## 2023-04-11 ENCOUNTER — Encounter: Payer: Self-pay | Admitting: Orthopaedic Surgery

## 2023-04-11 ENCOUNTER — Ambulatory Visit (INDEPENDENT_AMBULATORY_CARE_PROVIDER_SITE_OTHER): Payer: Medicare Other | Admitting: Orthopaedic Surgery

## 2023-04-11 ENCOUNTER — Ambulatory Visit: Payer: Medicare Other | Admitting: Sports Medicine

## 2023-04-11 ENCOUNTER — Ambulatory Visit: Payer: Self-pay

## 2023-04-11 ENCOUNTER — Encounter: Payer: Self-pay | Admitting: Sports Medicine

## 2023-04-11 DIAGNOSIS — M25552 Pain in left hip: Secondary | ICD-10-CM | POA: Diagnosis not present

## 2023-04-11 DIAGNOSIS — M1611 Unilateral primary osteoarthritis, right hip: Secondary | ICD-10-CM

## 2023-04-11 MED ORDER — METHYLPREDNISOLONE ACETATE 40 MG/ML IJ SUSP
80.0000 mg | INTRAMUSCULAR | Status: AC | PRN
  Administered 2023-04-11: 80 mg via INTRA_ARTICULAR

## 2023-04-11 MED ORDER — LIDOCAINE HCL 1 % IJ SOLN
4.0000 mL | INTRAMUSCULAR | Status: AC | PRN
  Administered 2023-04-11: 4 mL

## 2023-04-11 NOTE — Progress Notes (Signed)
   Procedure Note  Patient: Derek Ryan             Date of Birth: [birth year]           MRN: [id number]             Visit Date: 04/11/2023  Procedures: Visit Diagnoses:  1. Pain in left hip   2. Unilateral primary osteoarthritis, right hip    Large Joint Inj: L hip joint on 04/11/2023 10:25 AM Indications: pain Details: 22 G 3.5 in needle, ultrasound-guided anterior approach Medications: 4 mL lidocaine 1 %; 80 mg methylPREDNISolone acetate 40 MG/ML Outcome: tolerated well, no immediate complications  Procedure: US-guided intra-articular hip injection, left After discussion on risks/benefits/indications and informed verbal consent was obtained, a timeout was performed. Patient was lying supine on exam table. The hip was cleaned with betadine and alcohol swabs. Then utilizing ultrasound guidance, the patient's femoral head and neck junction was identified and subsequently injected with 4:2 lidocaine:depomedrol via an in-plane approach with ultrasound visualization of the injectate administered into the hip joint. Patient tolerated procedure well without immediate complications.  Procedure, treatment alternatives, risks and benefits explained, specific risks discussed. Consent was given by the patient. Immediately prior to procedure a time out was called to verify the correct patient, procedure, equipment, support staff and site/side marked as required. Patient was prepped and draped in the usual sterile fashion.     - I evaluated the patient about 5 minutes post-injection and he was doing well without AE's - follow-up with Dr. Roda Shutters as indicated; I am happy to see them as needed  Madelyn Brunner, DO Primary Care Sports Medicine Physician  [facility] - Orthopedics  This note was dictated using Dragon naturally speaking software and may contain errors in syntax, spelling, or content which have not been identified prior to signing this note.

## 2023-04-11 NOTE — Progress Notes (Signed)
Office Visit Note   Patient: Derek Ryan           Date of Birth: [birth year]           MRN: [id number] Visit Date: 04/11/2023              Requested by: Raliegh Ip, DO [address] [place],  [place] [address] PCP: Raliegh Ip, DO   Assessment & Plan: Visit Diagnoses:  1. Pain in left hip     Plan: Mr. Delly is a very pleasant 67 year old gentleman with left hip pain impression is symptomatic osteoarthritis.  I would like to send him to Dr. Shon Baton to try an intra-articular steroid injection.  We will also provide him with a home exercise program for strengthening.  He should follow-up if symptoms do not improve after 6 weeks.  Follow-Up Instructions: No follow-ups on file.   Orders:  Orders Placed This Encounter  Procedures   XR HIP UNILAT W OR W/O PELVIS 2-3 VIEWS LEFT   No orders of the defined types were placed in this encounter.     Procedures: No procedures performed   Clinical Data: No additional findings.   Subjective: Chief Complaint  Patient presents with   Left Hip - Pain    HPI Mr. Sunderhaus is a very pleasant 67 year old gentleman who comes in for chronic left hip pain with recent worsening.  His main complaint is that it gives out while he is walking.  Denies any radicular symptoms.  He sees Dr. Franky Macho for his back.  Review of Systems  Constitutional: Negative.   HENT: Negative.    Eyes: Negative.   Respiratory: Negative.    Cardiovascular: Negative.   Gastrointestinal: Negative.   Endocrine: Negative.   Genitourinary: Negative.   Skin: Negative.   Allergic/Immunologic: Negative.   Neurological: Negative.   Hematological: Negative.   Psychiatric/Behavioral: Negative.    All other systems reviewed and are negative.    Objective: Vital Signs: There were no vitals taken for this visit.  Physical Exam Vitals and nursing note reviewed.  Constitutional:      Appearance: He is well-developed.  HENT:     Head:  Normocephalic and atraumatic.  Eyes:     Pupils: Pupils are equal, round, and reactive to light.  Pulmonary:     Effort: Pulmonary effort is normal.  Abdominal:     Palpations: Abdomen is soft.  Musculoskeletal:        General: Normal range of motion.     Cervical back: Neck supple.  Skin:    General: Skin is warm.  Neurological:     Mental Status: He is alert and oriented to person, place, and time.  Psychiatric:        Behavior: Behavior normal.        Thought Content: Thought content normal.        Judgment: Judgment normal.     Ortho Exam Examination of the left hip shows preserved range of motion for the most part.  Positive Stinchfield sign.  No trochanteric tenderness.  Negative logroll. Specialty Comments:  No specialty comments available.  Imaging: No results found.   PMFS History: Patient Active Problem List   Diagnosis Date Noted   Aortic atherosclerosis (HCC) 07/15/2022   Benign prostatic hyperplasia without lower urinary tract symptoms 07/15/2022   Pre-diabetes 07/15/2022   No-show for appointment 08/16/2021   Loop recorder Biomonitor-Biotronik 05/25/2021 05/26/2021   Cryptogenic stroke (HCC) 05/26/2021   PAC (premature atrial contraction) 07/30/2020  Hospital discharge follow-up 04/07/2020   Retinal artery branch occlusion, 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   Retinal artery occlusion 04/01/2020   CRAO (central retinal artery occlusion), left    Hypertension    Severe obstructive sleep apnea 12/13/2019   LVH (left ventricular hypertrophy) due to hypertensive disease 08/25/2019   Chronic diastolic heart failure (HCC) 08/25/2019   Hypertensive heart disease with chronic diastolic congestive heart failure (HCC) 08/25/2019   Severe obesity (BMI 35.0-39.9) with comorbidity (HCC) 07/24/2018   Moderate single current episode of major depressive disorder (HCC) 04/25/2016   Elevated hemoglobin A1c 01/18/2016   Dyslipidemia 01/12/2016   Essential hypertension 01/12/2016    Gastroesophageal reflux disease without esophagitis 01/12/2016   Vitamin D deficiency 01/12/2016   Anxiety, generalized 03/30/2014   Hyperlipidemia 03/30/2014   Past Medical History:  Diagnosis Date   Central retinal artery occlusion of left eye    Chronic diastolic heart failure (HCC) 08/25/2019   Cryptogenic stroke (HCC) 05/26/2021   Depression    GERD (gastroesophageal reflux disease)    Hyperlipidemia    Hypertension    Loop recorder Biomonitor-Biotronik 05/25/2021 05/26/2021   LVH (left ventricular hypertrophy) due to hypertensive disease 08/25/2019   OSA (obstructive sleep apnea)    unable to tolerate CPAP   Retinal artery branch occlusion, 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    Family History  Problem Relation Age of Onset   Heart attack Mother    Alcohol abuse Father    Stomach cancer Sister    Hypertension Brother    Heart attack Brother    Brain cancer Brother    Heart attack Nephew 49       deceased from massive MI    Past Surgical History:  Procedure Laterality Date   COLONOSCOPY     Social History   Occupational History   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

## 2023-05-07 DIAGNOSIS — Z95818 Presence of other cardiac implants and grafts: Secondary | ICD-10-CM | POA: Diagnosis not present

## 2023-05-07 DIAGNOSIS — I639 Cerebral infarction, unspecified: Secondary | ICD-10-CM | POA: Diagnosis not present

## 2023-05-07 DIAGNOSIS — Z4509 Encounter for adjustment and management of other cardiac device: Secondary | ICD-10-CM | POA: Diagnosis not present

## 2023-05-09 ENCOUNTER — Telehealth: Payer: Self-pay | Admitting: Orthopaedic Surgery

## 2023-05-09 NOTE — Telephone Encounter (Signed)
Patient called and says that he thinks he needs a hip replacement and can you open an slot for him to come in? [phone]

## 2023-05-21 ENCOUNTER — Encounter: Payer: Self-pay | Admitting: Orthopaedic Surgery

## 2023-05-21 ENCOUNTER — Ambulatory Visit: Payer: Medicare Other | Admitting: Orthopaedic Surgery

## 2023-05-21 VITALS — Ht 68.0 in | Wt 233.0 lb

## 2023-05-21 DIAGNOSIS — M1612 Unilateral primary osteoarthritis, left hip: Secondary | ICD-10-CM

## 2023-05-21 NOTE — Progress Notes (Signed)
Office Visit Note   Patient: Derek Ryan           Date of Birth: 08-02-1956           MRN: [id number] Visit Date: 05/21/2023              Requested by: Raliegh Ip, DO [address] [place],  [place] [address] PCP: Raliegh Ip, DO   Assessment & Plan: Visit Diagnoses:  1. Primary osteoarthritis of left hip     Plan: Impression is severe left hip degenerative joint disease secondary to Osteoarthritis.  Imaging shows bone on bone joint space narrowing.  At this point, conservative treatments fail to provide any significant relief and the pain is severely affecting ADLs and quality of life.  Based on treatment options, the patient has elected to move forward with a hip replacement.  We have discussed the surgical risks that include but are not limited to infection, DVT, leg length discrepancy, numbness, tingling, incomplete relief of pain.  Recovery and prognosis were also reviewed.    He is looking to do this sometime in January.  Current anticoagulants: No antithrombotic Postop anticoagulation: Aspirin 81 mg Diabetic: Yes, will repeat A1c at upcoming PCP visit Prior DVT/PE: No Tobacco use: No Clearances needed for surgery: Dr. Jacinto Halim cardiology; Dr. Nadine Counts PCP Anticipate discharge dispo: home   Follow-Up Instructions: No follow-ups on file.   Orders:  No orders of the defined types were placed in this encounter.  No orders of the defined types were placed in this encounter.     Procedures: No procedures performed   Clinical Data: No additional findings.   Subjective: Chief Complaint  Patient presents with   Left Hip - Pain    HPI Derek Ryan is a very pleasant 67 year old gentleman who follows up today for left hip DJD.  We saw him about 6 weeks ago and he underwent a hip injection with Dr. Shon Baton on 04/11/2023.  He states that the injection helped tremendously for a few weeks but unfortunately the symptoms have returned.  Due to the brief period of  relief he is not interested in any other treatments that only provide temporary relief.  Review of Systems  Constitutional: Negative.   HENT: Negative.    Eyes: Negative.   Respiratory: Negative.    Cardiovascular: Negative.   Gastrointestinal: Negative.   Endocrine: Negative.   Genitourinary: Negative.   Skin: Negative.   Allergic/Immunologic: Negative.   Neurological: Negative.   Hematological: Negative.   Psychiatric/Behavioral: Negative.    All other systems reviewed and are negative.    Objective: Vital Signs: Ht 5\' 8"  (1.727 m)   Wt 233 lb (105.7 kg)   BMI 35.43 kg/m   Physical Exam Vitals and nursing note reviewed.  Constitutional:      Appearance: He is well-developed.  Pulmonary:     Effort: Pulmonary effort is normal.  Abdominal:     Palpations: Abdomen is soft.  Skin:    General: Skin is warm.  Neurological:     Mental Status: He is alert and oriented to person, place, and time.  Psychiatric:        Behavior: Behavior normal.        Thought Content: Thought content normal.        Judgment: Judgment normal.     Ortho Exam Exam of the left hip shows pain with any attempted hip range of motion.  Antalgic gait. Specialty Comments:  No specialty comments available.  Imaging: No  results found.   PMFS History: Patient Active Problem List   Diagnosis Date Noted   Aortic atherosclerosis (HCC) 07/15/2022   Benign prostatic hyperplasia without lower urinary tract symptoms 07/15/2022   Pre-diabetes 07/15/2022   No-show for appointment 08/16/2021   Loop recorder Biomonitor-Biotronik 05/25/2021 05/26/2021   Cryptogenic stroke (HCC) 05/26/2021   PAC (premature atrial contraction) 07/30/2020   Hospital discharge follow-up 04/07/2020   Retinal artery branch occlusion, 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   Retinal artery occlusion 04/01/2020   CRAO (central retinal artery occlusion), left    Hypertension    Severe obstructive sleep apnea 12/13/2019   LVH (left  ventricular hypertrophy) due to hypertensive disease 08/25/2019   Chronic diastolic heart failure (HCC) 08/25/2019   Hypertensive heart disease with chronic diastolic congestive heart failure (HCC) 08/25/2019   Severe obesity (BMI 35.0-39.9) with comorbidity (HCC) 07/24/2018   Moderate single current episode of major depressive disorder (HCC) 04/25/2016   Elevated hemoglobin A1c 01/18/2016   Dyslipidemia 01/12/2016   Essential hypertension 01/12/2016   Gastroesophageal reflux disease without esophagitis 01/12/2016   Vitamin D deficiency 01/12/2016   Anxiety, generalized 03/30/2014   Hyperlipidemia 03/30/2014   Past Medical History:  Diagnosis Date   Central retinal artery occlusion of left eye    Chronic diastolic heart failure (HCC) 08/25/2019   Cryptogenic stroke (HCC) 05/26/2021   Depression    GERD (gastroesophageal reflux disease)    Hyperlipidemia    Hypertension    Loop recorder Biomonitor-Biotronik 05/25/2021 05/26/2021   LVH (left ventricular hypertrophy) due to hypertensive disease 08/25/2019   OSA (obstructive sleep apnea)    unable to tolerate CPAP   Retinal artery branch occlusion, 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    Family History  Problem Relation Age of Onset   Heart attack Mother    Alcohol abuse Father    Stomach cancer Sister    Hypertension Brother    Heart attack Brother    Brain cancer Brother    Heart attack Nephew 49       deceased from massive MI    Past Surgical History:  Procedure Laterality Date   COLONOSCOPY     Social History   Occupational History   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

## 2023-06-12 ENCOUNTER — Telehealth: Payer: Self-pay

## 2023-06-12 NOTE — Telephone Encounter (Signed)
Left message to call back for IN OFFICE APPT FOR PRE OP CLEARANCE.

## 2023-06-12 NOTE — Telephone Encounter (Signed)
Pre-operative Risk Assessment    Patient Name: Derek Ryan  DOB: 01-22-1956 MRN: [id number]     Request for Surgical Clearance    Procedure:  Left total hip   Date of Surgery:  Clearance TBD                                 Surgeon:  Dr Cheral Almas  Surgeon's Group or Practice Name:  [facility] Phone number:  [phone] Fax number:  [phone]   Type of Clearance Requested:   - Pharmacy:  Hold Aspirin     Type of Anesthesia:  Spinal   Additional requests/questions:    SignedVernard Gambles   06/12/2023, 3:54 PM

## 2023-06-12 NOTE — Telephone Encounter (Signed)
Primary Cardiologist:Jay Jacinto Halim, MD  Chart reviewed as part of pre-operative protocol coverage. Because of Derek Ryan's past medical history and time since last visit, he/she will require a follow-up visit in order to better assess preoperative cardiovascular risk.  Pre-op covering staff: - Please schedule appointment and call patient to inform them. He has not been seen since 06/06/21.  - Please contact requesting surgeon's office via preferred method (i.e, phone, fax) to inform them of need for appointment prior to surgery.  Request to hold aspirin should be addressed at the time of the office visit. There is no obvious cardiac indication for aspirin.    Levi Aland, NP-C  06/12/2023, 4:08 PM [address] Office [phone] Fax [phone]

## 2023-06-13 NOTE — Telephone Encounter (Signed)
Patient has now been scheduled for in office pre op clearance, pt agrees to appt date and time.

## 2023-06-16 ENCOUNTER — Ambulatory Visit (INDEPENDENT_AMBULATORY_CARE_PROVIDER_SITE_OTHER): Payer: Medicare Other

## 2023-06-16 DIAGNOSIS — I639 Cerebral infarction, unspecified: Secondary | ICD-10-CM

## 2023-06-16 LAB — CUP PACEART REMOTE DEVICE CHECK
Date Time Interrogation Session: 20241028124859
Implantable Pulse Generator Implant Date: 20221007
Pulse Gen Model: 436066
Pulse Gen Serial Number: 94077085

## 2023-06-23 ENCOUNTER — Ambulatory Visit: Payer: Medicare Other

## 2023-06-23 VITALS — Ht 68.0 in | Wt 230.0 lb

## 2023-06-23 DIAGNOSIS — Z0000 Encounter for general adult medical examination without abnormal findings: Secondary | ICD-10-CM | POA: Diagnosis not present

## 2023-06-23 NOTE — Progress Notes (Signed)
Subjective:   Derek Ryan is a 67 y.o. male who presents for Medicare Annual/Subsequent preventive examination.  Visit Complete: Virtual I connected with  Derek Ryan on 06/23/23 by a audio enabled telemedicine application and verified that I am speaking with the correct person using two identifiers.  Patient Location: Home  Provider Location: Home Office  I discussed the limitations of evaluation and management by telemedicine. The patient expressed understanding and agreed to proceed.  Vital Signs: Because this visit was a virtual/telehealth visit, some criteria may be missing or patient reported. Any vitals not documented were not able to be obtained and vitals that have been documented are patient reported.  Patient Medicare AWV questionnaire was completed by the patient on 06/23/2023; I have confirmed that all information answered by patient is correct and no changes since this date.  Cardiac Risk Factors include: advanced age (>34men, >67 women);male gender;dyslipidemia;hypertension     Objective:    Today's Vitals   06/23/23 0830  Weight: 230 lb (104.3 kg)  Height: 5\' 8"  (1.727 m)   Body mass index is 34.97 kg/m.     06/23/2023    8:33 AM 06/21/2022    8:34 AM 04/24/2021   10:43 AM 04/20/2020   11:20 AM 03/30/2020   10:23 AM  Advanced Directives  Does Patient Have a Medical Advance Directive? Yes Yes Yes No No  Type of Estate agent of [place];Living will Healthcare Power of [place];Living will Living will    Copy of Healthcare Power of Attorney in Chart? No - copy requested No - copy requested     Would patient like information on creating a medical advance directive?   No - Patient declined No - Patient declined No - Patient declined    Current Medications (verified) Outpatient Encounter Medications as of 06/23/2023  Medication Sig   aspirin EC 81 MG tablet Take 81 mg by mouth daily.   atorvastatin (LIPITOR) 10 MG tablet TAKE 1 TABLET BY  MOUTH EVERYDAY AT BEDTIME   busPIRone (BUSPAR) 10 MG tablet TAKE 1 TABLET (10 MG TOTAL) BY MOUTH 3 (THREE) TIMES DAILY. FOR ANXIETY   cholecalciferol (VITAMIN D3) 25 MCG (1000 UT) tablet Take 2,000 Units by mouth daily.   escitalopram (LEXAPRO) 20 MG tablet Take 1 tablet (20 mg total) by mouth daily.   hydrOXYzine (VISTARIL) 25 MG capsule Take 1 capsule (25 mg total) by mouth every 8 (eight) hours as needed.   losartan-hydrochlorothiazide (HYZAAR) 50-12.5 MG tablet Take 1 tablet by mouth daily.   nitroGLYCERIN (NITROSTAT) 0.4 MG SL tablet Place 1 tablet (0.4 mg total) under the tongue every 5 (five) minutes as needed for chest pain (then call 911).   Omega-3 Fatty Acids (FISH OIL) 1000 MG CAPS Take by mouth.   vitamin C (ASCORBIC ACID) 500 MG tablet Take 500 mg by mouth daily.   No facility-administered encounter medications on file as of 06/23/2023.    Allergies (verified) Doxycycline   History: Past Medical History:  Diagnosis Date   Central retinal artery occlusion of left eye    Chronic diastolic heart failure (HCC) 08/25/2019   Cryptogenic stroke (HCC) 05/26/2021   Depression    GERD (gastroesophageal reflux disease)    Hyperlipidemia    Hypertension    Loop recorder Biomonitor-Biotronik 05/25/2021 05/26/2021   LVH (left ventricular hypertrophy) due to hypertensive disease 08/25/2019   OSA (obstructive sleep apnea)    unable to tolerate CPAP   Retinal artery branch occlusion, 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  Past Surgical History:  Procedure Laterality Date   COLONOSCOPY     Family History  Problem Relation Age of Onset   Heart attack Mother    Alcohol abuse Father    Stomach cancer Sister    Hypertension Brother    Heart attack Brother    Brain cancer Brother    Heart attack Nephew 49       deceased from massive MI   Social History   Socioeconomic History   Marital status: Widowed    Spouse name: Not on file   Number of children: 3   Years of education: Not on file   Highest  education level: Not on file  Occupational History   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   Works at [employer] Part time.    Social Determinants of Health   Financial Resource Strain: Low Risk  (06/23/2023)   Overall Financial Resource Strain (CARDIA)    Difficulty of Paying Living Expenses: Not hard at all  Food Insecurity: No Food Insecurity (06/23/2023)   Hunger Vital Sign    Worried About Running Out of Food in the Last Year: Never true    Ran Out of Food in the Last Year: Never true  Transportation Needs: No Transportation Needs (06/23/2023)   PRAPARE - Administrator, Civil Service (Medical): No    Lack of Transportation (Non-Medical): No  Physical Activity: Insufficiently Active (06/23/2023)   Exercise Vital Sign    Days of Exercise per Week: 3 days    Minutes of Exercise per Session: 30 min  Stress: No Stress Concern Present (06/23/2023)   [employer] of Occupational Health - Occupational Stress Questionnaire    Feeling of Stress : Not at all  Social Connections: Moderately Isolated (06/23/2023)   Social Connection and Isolation Panel [NHANES]    Frequency of Communication with Friends and Family: More than three times a week    Frequency of Social Gatherings with Friends and Family: More than three times a week    Attends Religious Services: More than 4 times per year    Active Member of [employer] or Organizations: No     Attends Banker Meetings: Never    Marital Status: Widowed    Tobacco Counseling Counseling given: Not Answered   Clinical Intake:  Pre-visit preparation completed: Yes  Pain : No/denies pain     Nutritional Risks: None Diabetes: No  How often do you need to have someone help you when you read instructions, pamphlets, or other written materials from your doctor or pharmacy?: 1 - Never  Interpreter Needed?: No  Information entered by :: Renie Ora, LPN   Activities of Daily Living    06/23/2023    8:33 AM  In your present state of health, do you have any difficulty performing  the following activities:  Hearing? 0  Vision? 0  Difficulty concentrating or making decisions? 0  Walking or climbing stairs? 0  Dressing or bathing? 0  Doing errands, shopping? 0  Preparing Food and eating ? N  Using the Toilet? N  In the past six months, have you accidently leaked urine? N  Do you have problems with loss of bowel control? N  Managing your Medications? N  Managing your Finances? N  Housekeeping or managing your Housekeeping? N    Patient Care Team: Raliegh Ip, DO as PCP - General (Family Medicine) Yates Decamp, MD as PCP - Cardiology (Cardiology)  Indicate any recent Medical Services you may have received from other than [facility] providers in the past year (date may be approximate).     Assessment:   This is a routine wellness examination for [place].  Hearing/Vision screen Vision Screening - Comments:: Wears rx glasses - up to date with routine eye exams with  Dr.johnson    Goals Addressed             This Visit's Progress    exercise   On track    Pt would like to eat better and exercise more.        Depression Screen    06/23/2023    8:31 AM 12/11/2022    8:28 AM 12/11/2022    8:21 AM 09/24/2022   10:51 AM 07/24/2022    1:47 PM 07/15/2022    9:03 AM 06/21/2022    8:33 AM  PHQ 2/9 Scores  PHQ - 2 Score 0 2 0 1 0 2 0  PHQ- 9 Score  9   6 0 7     Fall Risk    06/23/2023    8:30 AM 01/31/2023   10:47 AM 12/11/2022    8:21 AM 07/24/2022    1:47 PM 07/15/2022    9:03 AM  Fall Risk   Falls in the past year? 0 0 0 0 0  Number falls in past yr: 0 0     Injury with Fall? 0 0     Risk for fall due to : No Fall Risks No Fall Risks     Follow up Falls prevention discussed Education provided       MEDICARE RISK AT HOME: Medicare Risk at Home Any stairs in or around the home?: Yes If so, are there any without handrails?: No Home free of loose throw rugs in walkways, pet beds, electrical cords, etc?: Yes Adequate lighting in your home to reduce risk of falls?: Yes Life alert?: No Use of a cane, walker or w/c?: No Grab bars in the bathroom?: Yes Shower chair or bench in shower?: Yes Elevated toilet seat or a handicapped toilet?: Yes  TIMED UP AND GO:  Was the test performed?  No    Cognitive Function:        06/23/2023    8:33 AM 06/21/2022    8:34 AM 04/24/2021   10:49 AM 04/20/2020   11:17 AM  6CIT Screen  What Year? 0 points 0 points 0 points 0 points  What month? 0 points 0 points 0 points 0 points  What time? 0 points 0 points 0 points 0 points  Count back from 20 0 points 0 points 0 points 0 points  Months in reverse 0 points 0 points 0 points 0 points  Repeat phrase 0 points 0 points 2 points 0 points  Total Score 0 points 0 points 2 points 0 points  Immunizations Immunization History  Administered Date(s) Administered   [employer] Vaccination 12/14/2019, 01/11/2020, 08/14/2020   Pneumococcal Conjugate-13 07/26/2019   Tdap 01/18/2012   Unspecified SARS-COV-2 Vaccination 12/14/2019    TDAP status: Due, Education has been provided regarding the importance of this vaccine. Advised may receive this vaccine at local pharmacy or Health Dept. Aware to provide a copy of the vaccination record if obtained from local pharmacy or Health Dept. Verbalized acceptance and understanding.  Flu Vaccine  status: Due, Education has been provided regarding the importance of this vaccine. Advised may receive this vaccine at local pharmacy or Health Dept. Aware to provide a copy of the vaccination record if obtained from local pharmacy or Health Dept. Verbalized acceptance and understanding.  Pneumococcal vaccine status: Up to date  Covid-19 vaccine status: Completed vaccines  Qualifies for Shingles Vaccine? Yes   Zostavax completed No   Shingrix Completed?: No.    Education has been provided regarding the importance of this vaccine. Patient has been advised to call insurance company to determine out of pocket expense if they have not yet received this vaccine. Advised may also receive vaccine at local pharmacy or Health Dept. Verbalized acceptance and understanding.  Screening Tests Health Maintenance  Topic Date Due   Zoster Vaccines- Shingrix (1 of 2) Never done   DTaP/Tdap/Td (2 - Td or Tdap) 01/17/2022   INFLUENZA VACCINE  Never done   COVID-19 Vaccine (5 - 2023-24 season) 04/20/2023   Pneumonia Vaccine 46+ Years old (2 of 2 - PPSV23 or PCV20) 01/31/2024 (Originally 07/25/2020)   Medicare Annual Wellness (AWV)  06/22/2024   Colonoscopy  04/29/2025   Hepatitis C Screening  Completed   HPV VACCINES  Aged Out    Health Maintenance  Health Maintenance Due  Topic Date Due   Zoster Vaccines- Shingrix (1 of 2) Never done   DTaP/Tdap/Td (2 - Td or Tdap) 01/17/2022   INFLUENZA VACCINE  Never done   COVID-19 Vaccine (5 - 2023-24 season) 04/20/2023    Colorectal cancer screening: Type of screening: Colonoscopy. Completed 04/29/2022. Repeat every 3 years  Lung Cancer Screening: (Low Dose CT Chest recommended if Age 10-80 years, 20 pack-year currently smoking OR have quit w/in 15years.) does not qualify.   Lung Cancer Screening Referral: n/a  Additional Screening:  Hepatitis C Screening: does not qualify; Completed 07/26/2019  Vision Screening: Recommended annual ophthalmology exams  for early detection of glaucoma and other disorders of the eye. Is the patient up to date with their annual eye exam?  Yes  Who is the provider or what is the name of the office in which the patient attends annual eye exams? Dr.johnson If pt is not established with a provider, would they like to be referred to a provider to establish care? No .   Dental Screening: Recommended annual dental exams for proper oral hygiene   Community Resource Referral / Chronic Care Management: CRR required this visit?  No   CCM required this visit?  No     Plan:     I have personally reviewed and noted the following in the patient's chart:   Medical and social history Use of alcohol, tobacco or illicit drugs  Current medications and supplements including opioid prescriptions. Patient is not currently taking opioid prescriptions. Functional ability and status Nutritional status Physical activity Advanced directives List of other physicians Hospitalizations, surgeries, and ER visits in previous 12 months Vitals Screenings to include cognitive, depression, and falls Referrals and appointments  In addition, I have  reviewed and discussed with patient certain preventive protocols, quality metrics, and best practice recommendations. A written personalized care plan for preventive services as well as general preventive health recommendations were provided to patient.     Lorrene Reid, LPN   [id number]   After Visit Summary: (MyChart) Due to this being a telephonic visit, the after visit summary with patients personalized plan was offered to patient via MyChart   Nurse Notes: none

## 2023-06-23 NOTE — Patient Instructions (Signed)
Derek Ryan , Thank you for taking time to come for your Medicare Wellness Visit. I appreciate your ongoing commitment to your health goals. Please review the following plan we discussed and let me know if I can assist you in the future.   Referrals/Orders/Follow-Ups/Clinician Recommendations: Aim for 30 minutes of exercise or brisk walking, 6-8 glasses of water, and 5 servings of fruits and vegetables each day.   This is a list of the screening recommended for you and due dates:  Health Maintenance  Topic Date Due   Zoster (Shingles) Vaccine (1 of 2) Never done   DTaP/Tdap/Td vaccine (2 - Td or Tdap) 01/17/2022   Flu Shot  Never done   COVID-19 Vaccine (5 - 2023-24 season) 04/20/2023   Pneumonia Vaccine (2 of 2 - PPSV23 or PCV20) 01/31/2024*   Medicare Annual Wellness Visit  06/22/2024   Colon Cancer Screening  04/29/2025   Hepatitis C Screening  Completed   HPV Vaccine  Aged Out  *Topic was postponed. The date shown is not the original due date.    Advanced directives: (Copy Requested) Please bring a copy of your health care power of attorney and living will to the office to be added to your chart at your convenience.  Next Medicare Annual Wellness Visit scheduled for next year: Yes  insert Preventive Care attachment Insert FALL PREVENTION attachment if needed

## 2023-07-07 NOTE — Progress Notes (Signed)
Biotronik Loop Recorder 

## 2023-07-10 NOTE — Progress Notes (Signed)
Cardiology Office Note:  .   Date:  07/14/2023  ID:  Derek Ryan, DOB 09-10-1955, MRN [id number] PCP: Raliegh Ip, DO  [facility] Providers Cardiologist:  Yates Decamp, MD    Patient Profile: .      PMH Hypertension Hyperlipidemia Hyperglycemia Sleep apnea (noncompliant with CPAP) Coronary calcification Aortic atherosclerosis Cryptogenic stroke/Retinal artery occlusion S/p ILR implant 05/25/2021  History of COVID-19 pneumonia 05/2019.  Admitted to the hospital 03/30/2020 with left visual loss associated with headache and left facial paresthesia and diagnosed with central retinal artery occlusion with extensive evaluation including outpatient extended telemetry.  No etiology was found.  He was evaluated by neurology and it was agreed that he would benefit from loop recorder implantation and subsequently underwent ILR implant 05/25/2021.  He has been followed by Dr. Jacinto Halim.  Last cardiology clinic visit was 06/06/2021 with Dr. Jacinto Halim.  He reported chest pain.  EKG did not reveal evidence of ischemia.  Review of loop recorder data showed no significant abnormalities.  His chest pain was felt to be atypical and likely noncardiac.  He was counseled on use of sublingual nitroglycerin and no further ischemia evaluation was ordered.       History of Present Illness: .   Derek Ryan is a very pleasant 67 y.o. male who is here today for overdue follow-up. He is planning to have hip replacement surgery and is here for preoperative cardiac evaluation. Reports overall has been doing well. 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  About 1 week ago, had an episode of chest pressure that occurred while working shortly after eating. He describes the discomfort as 'a little bit of pressure' in the mid-sternal region. No associated symptoms such SOB, diaphoresis, or n/v. Has not experienced any similar episodes since, wonders if it was indigestion. He reports a history of  severe sleep apnea, for which he has been unable to tolerate machine therapy. He expresses concern about potential anesthesia complications related to his sleep apnea in the context of an upcoming hip replacement surgery. We discussed EKG findings from today that reveal known right bundle branch block, which has been stable and asymptomatic. He has a loop monitor in place, which has not detected any atrial fibrillation or pauses. His most recent echocardiogram in August 2021 showed normal heart function.  Walks occasionally for exercise but no regular exercise routine.  He denies shortness of breath, orthopnea, PND, palpitations, presyncope, syncope.   Discussed the use of AI scribe software for clinical note transcription with the patient, who gave verbal consent to proceed.   ROS: See HPI       Studies Reviewed: [name]   EKG Interpretation Date/Time:  Monday July 14 2023 08:49:52 EST Ventricular Rate:  63 PR Interval:  166 QRS Duration:  134 QT Interval:  446 QTC Calculation: 456 R Axis:   42  Text Interpretation: Normal sinus rhythm Right bundle branch block When compared with ECG of 31-Mar-2020 11:36, No acute changes Confirmed by Eligha Bridegroom [phone]) on 07/14/2023 8:58:59 AM    Risk Assessment/Calculations:             Physical Exam:   VS:  BP (!) 120/56   Pulse 63   Ht 5\' 8"  (1.727 m)   Wt 239 lb (108.4 kg)   SpO2 94%   BMI 36.34 kg/m    Wt Readings from Last 3 Encounters:  07/14/23 239 lb (108.4 kg)  06/23/23 230 lb (104.3 kg)  05/21/23 233 lb (105.7 kg)    GEN: Well nourished, well developed in no acute distress NECK: No JVD; No carotid bruits CARDIAC: RRR, no murmurs, rubs, gallops RESPIRATORY:  Clear to auscultation without rales, wheezing or rhonchi  ABDOMEN: Soft, non-tender, non-distended EXTREMITIES:  No edema; No deformity     ASSESSMENT AND PLAN: .    Chest pain: Coronary artery calcification noted on CT in 2022. Recent episode of chest pressure,  possibly related to indigestion. No other symptoms of angina.  EKG today does not reveal any concerning ST abnormality.  No changes in exercise tolerance. Due to pending hip replacement surgery, we will get Lexiscan Myoview to evaluate for ischemia. No bleeding concerns.  Continue aspirin, atorvastatin, Hyzaar.  Preoperative cardiac evaluation: He is planning to undergo total hip replacement in January 2025.  According to the Revised Cardiac Risk Index (RCRI), his Perioperative Risk of Major Cardiac Event is (%): 6.6. His Functional Capacity in METs is: 6.61 according to the Duke Activity Status Index (DASI).  He is able to achieve > 4 METS activity, however he had an episode of chest pain that is concerning to him.  We will get nuclear stress test for evaluation of ischemia as noted above.  Sleep apnea: Severe sleep apnea diagnosed 07/2020. He has not tolerated CPAP. Upcoming hip replacement surgery in January. Recommend follow-up with sleep medicine specialist (Dr. Almyra Brace) to discuss management.   Cryptogenic stroke: Has ILR implanted. Most recent device check 06/16/2023 reveals no atrial fibrillation, no significant bradycardia or pauses.  He denies lightheadedness, presyncope, syncope, or other symptoms concerning for TIA/stroke.  Management per EP cardiology.   Hyperlipidemia LDL goal < 70: Most recent lipid panel 07/15/2022 with total cholesterol 222, HDL 42, LDL 144, and triglycerides 201.  He is on atorvastatin 10 mg daily.  We will have him repeat lipid panel when he can return fasting.  If LDL still above goal, would recommend higher intensity statin with consideration of adding ezetimibe 10 mg daily.  RBBB: Known. We discussed this finding in detail.  He is asymptomatic.  He had prior ischemia evaluation in 2020 with [facility] and echocardiogram.  We are repeating [facility] as noted above. Consider repeat echo if EF is abnormal on myoview.     Informed Consent   Shared Decision  Making/Informed Consent The risks [chest pain, shortness of breath, cardiac arrhythmias, dizziness, blood pressure fluctuations, myocardial infarction, stroke/transient ischemic attack, nausea, vomiting, allergic reaction, radiation exposure, metallic taste sensation and life-threatening complications (estimated to be 1 in 10,000)], benefits (risk stratification, diagnosing coronary artery disease, treatment guidance) and alternatives of a nuclear stress test were discussed in detail with Derek Ryan and he agrees to proceed.     Dispo: 1 year with Dr. Jacinto Halim  Signed, Eligha Bridegroom, NP-C

## 2023-07-14 ENCOUNTER — Encounter: Payer: Self-pay | Admitting: Nurse Practitioner

## 2023-07-14 ENCOUNTER — Ambulatory Visit: Payer: Medicare Other | Attending: Nurse Practitioner | Admitting: Nurse Practitioner

## 2023-07-14 VITALS — BP 120/56 | HR 63 | Ht 68.0 in | Wt 239.0 lb

## 2023-07-14 DIAGNOSIS — I4510 Unspecified right bundle-branch block: Secondary | ICD-10-CM | POA: Diagnosis not present

## 2023-07-14 DIAGNOSIS — G4733 Obstructive sleep apnea (adult) (pediatric): Secondary | ICD-10-CM | POA: Diagnosis not present

## 2023-07-14 DIAGNOSIS — I639 Cerebral infarction, unspecified: Secondary | ICD-10-CM

## 2023-07-14 DIAGNOSIS — I700 Atherosclerosis of aorta: Secondary | ICD-10-CM

## 2023-07-14 DIAGNOSIS — R079 Chest pain, unspecified: Secondary | ICD-10-CM | POA: Diagnosis not present

## 2023-07-14 DIAGNOSIS — I2510 Atherosclerotic heart disease of native coronary artery without angina pectoris: Secondary | ICD-10-CM | POA: Diagnosis not present

## 2023-07-14 DIAGNOSIS — Z01818 Encounter for other preprocedural examination: Secondary | ICD-10-CM

## 2023-07-14 DIAGNOSIS — I10 Essential (primary) hypertension: Secondary | ICD-10-CM | POA: Diagnosis not present

## 2023-07-14 NOTE — Patient Instructions (Signed)
Medication Instructions:   Your physician recommends that you continue on your current medications as directed. Please refer to the Current Medication list given to you today.   *If you need a refill on your cardiac medications before your next appointment, please call your pharmacy*   Lab Work:  Your physician recommends that you return for a FASTING lipid profile the day of your stress test.   If you have labs (blood work) drawn today and your tests are completely normal, you will receive your results only by: MyChart Message (if you have MyChart) OR A paper copy in the mail If you have any lab test that is abnormal or we need to change your treatment, we will call you to review the results.   Testing/Procedures:   You are scheduled for a Myocardial Perfusion Imaging Study on Monday, December 2 at 7:15 am.   Please arrive 15 minutes prior to your appointment time for registration and insurance purposes.   The test will take approximately 3 to 4 hours to complete; you may bring reading material. If someone comes with you to your appointment, they will need to remain in the main lobby due to limited space in the testing area.   How to prepare for your Myocardial Perfusion test:   Do not eat or drink 3 hours prior to your test, except you may have water.    Do not consume products containing caffeine (regular or decaffeinated) 12 hours prior to your test (ex: coffee, chocolate, soda, tea)   Do bring a list of your current medications with you. If not listed below, you may take your medications as normal.   Bring any held medication to your appointment, as you may be required to take it once the test is complete.   Do wear comfortable clothes (no overalls) and walking shoes. Tennis shoes are preferred. No open toed shoes.  Do not wear cologne, aftershave or lotions (deodorant is allowed).   If these instructions are not followed, you test will have to be rescheduled.    Please report to [address] for your test. If you have questions or concerns about your appointment, please call the Nuclear Lab at #[phone].  If you cannot keep your appointment, please provide 24 hour notification to the Nuclear lab to avoid a possible $50 charge to your account.       Follow-Up: At [facility], you and your health needs are our priority.  As part of our continuing mission to provide you with exceptional heart care, we have created designated Provider Care Teams.  These Care Teams include your primary Cardiologist (physician) and Advanced Practice Providers (APPs -  Physician Assistants and Nurse Practitioners) who all work together to provide you with the care you need, when you need it.  We recommend signing up for the patient portal called "MyChart".  Sign up information is provided on this After Visit Summary.  MyChart is used to connect with patients for Virtual Visits (Telemedicine).  Patients are able to view lab/test results, encounter notes, upcoming appointments, etc.  Non-urgent messages can be sent to your provider as well.   To learn more about what you can do with MyChart, go to [email or web address].    Your next appointment:   1 year(s)  Provider:   Yates Decamp, MD     Other Instructions  Your physician wants you to follow-up in: 1 year.  You will receive a reminder letter in the mail two months  in advance. If you don't receive a letter, please call our office to schedule the follow-up appointment.

## 2023-07-15 ENCOUNTER — Telehealth (HOSPITAL_COMMUNITY): Payer: Self-pay | Admitting: *Deleted

## 2023-07-15 ENCOUNTER — Encounter (HOSPITAL_COMMUNITY): Payer: Self-pay

## 2023-07-21 ENCOUNTER — Ambulatory Visit (HOSPITAL_COMMUNITY): Payer: Medicare Other | Attending: Cardiovascular Disease

## 2023-07-21 ENCOUNTER — Ambulatory Visit: Payer: Medicare Other

## 2023-07-21 DIAGNOSIS — I639 Cerebral infarction, unspecified: Secondary | ICD-10-CM

## 2023-07-21 DIAGNOSIS — I700 Atherosclerosis of aorta: Secondary | ICD-10-CM

## 2023-07-21 DIAGNOSIS — I4510 Unspecified right bundle-branch block: Secondary | ICD-10-CM | POA: Diagnosis not present

## 2023-07-21 DIAGNOSIS — Z01810 Encounter for preprocedural cardiovascular examination: Secondary | ICD-10-CM | POA: Diagnosis not present

## 2023-07-21 DIAGNOSIS — I10 Essential (primary) hypertension: Secondary | ICD-10-CM | POA: Diagnosis not present

## 2023-07-21 DIAGNOSIS — I110 Hypertensive heart disease with heart failure: Secondary | ICD-10-CM | POA: Diagnosis not present

## 2023-07-21 DIAGNOSIS — Z01818 Encounter for other preprocedural examination: Secondary | ICD-10-CM | POA: Diagnosis not present

## 2023-07-21 DIAGNOSIS — I5032 Chronic diastolic (congestive) heart failure: Secondary | ICD-10-CM | POA: Diagnosis not present

## 2023-07-21 LAB — MYOCARDIAL PERFUSION IMAGING
LV dias vol: 112 mL (ref 62–150)
LV sys vol: 47 mL
Nuc Stress EF: 58 %
Peak HR: 92 {beats}/min
Rest HR: 62 {beats}/min
Rest Nuclear Isotope Dose: 10.7 mCi
SDS: 5
SRS: 2
SSS: 7
ST Depression (mm): 0 mm
Stress Nuclear Isotope Dose: 31 mCi
TID: 1.06

## 2023-07-21 LAB — CUP PACEART REMOTE DEVICE CHECK
Date Time Interrogation Session: 20241202080312
Implantable Pulse Generator Implant Date: 20221007
Pulse Gen Model: 436066
Pulse Gen Serial Number: 94077085

## 2023-07-21 MED ORDER — TECHNETIUM TC 99M TETROFOSMIN IV KIT
10.7000 | PACK | Freq: Once | INTRAVENOUS | Status: AC | PRN
  Administered 2023-07-21: 10.7 via INTRAVENOUS

## 2023-07-21 MED ORDER — TECHNETIUM TC 99M TETROFOSMIN IV KIT
31.0000 | PACK | Freq: Once | INTRAVENOUS | Status: AC | PRN
  Administered 2023-07-21: 31 via INTRAVENOUS

## 2023-07-21 MED ORDER — REGADENOSON 0.4 MG/5ML IV SOLN
0.4000 mg | Freq: Once | INTRAVENOUS | Status: AC
  Administered 2023-07-21: 0.4 mg via INTRAVENOUS

## 2023-07-22 LAB — COMPREHENSIVE METABOLIC PANEL
ALT: 20 [IU]/L (ref 0–44)
AST: 19 [IU]/L (ref 0–40)
Albumin: 4.4 g/dL (ref 3.9–4.9)
Alkaline Phosphatase: 96 [IU]/L (ref 44–121)
BUN/Creatinine Ratio: 16 (ref 10–24)
BUN: 13 mg/dL (ref 8–27)
Bilirubin Total: 0.5 mg/dL (ref 0.0–1.2)
CO2: 27 mmol/L (ref 20–29)
Calcium: 9.3 mg/dL (ref 8.6–10.2)
Chloride: 101 mmol/L (ref 96–106)
Creatinine, Ser: 0.82 mg/dL (ref 0.76–1.27)
Globulin, Total: 2.5 g/dL (ref 1.5–4.5)
Glucose: 113 mg/dL — ABNORMAL HIGH (ref 70–99)
Potassium: 4.1 mmol/L (ref 3.5–5.2)
Sodium: 140 mmol/L (ref 134–144)
Total Protein: 6.9 g/dL (ref 6.0–8.5)
eGFR: 95 mL/min/{1.73_m2} (ref >59)

## 2023-07-22 LAB — LIPID PANEL
Chol/HDL Ratio: 3.4 {ratio} (ref 0.0–5.0)
Cholesterol, Total: 139 mg/dL (ref 100–199)
HDL: 41 mg/dL (ref >39)
LDL Chol Calc (NIH): 75 mg/dL (ref 0–99)
Triglycerides: 127 mg/dL (ref 0–149)
VLDL Cholesterol Cal: 23 mg/dL (ref 5–40)

## 2023-08-21 ENCOUNTER — Ambulatory Visit: Payer: Medicare Other

## 2023-08-21 DIAGNOSIS — I639 Cerebral infarction, unspecified: Secondary | ICD-10-CM | POA: Diagnosis not present

## 2023-08-21 LAB — CUP PACEART REMOTE DEVICE CHECK
Date Time Interrogation Session: 20250102080302
Implantable Pulse Generator Implant Date: 20221007
Pulse Gen Model: 436066
Pulse Gen Serial Number: 94077085

## 2023-08-30 DIAGNOSIS — M1612 Unilateral primary osteoarthritis, left hip: Secondary | ICD-10-CM | POA: Diagnosis not present

## 2023-09-19 ENCOUNTER — Ambulatory Visit: Payer: Medicare Other | Admitting: Orthopaedic Surgery

## 2023-09-19 ENCOUNTER — Other Ambulatory Visit (INDEPENDENT_AMBULATORY_CARE_PROVIDER_SITE_OTHER): Payer: Medicare Other

## 2023-09-19 DIAGNOSIS — M1612 Unilateral primary osteoarthritis, left hip: Secondary | ICD-10-CM | POA: Diagnosis not present

## 2023-09-19 NOTE — Progress Notes (Signed)
Office Visit Note   Patient: Derek Ryan           Date of Birth: 15-Aug-1956           MRN: [id number] Visit Date: 09/19/2023              Requested by: Raliegh Ip, DO [address] [place],  [place] [address] PCP: Raliegh Ip, DO   Assessment & Plan: Visit Diagnoses:  1. Primary osteoarthritis of left hip     Plan: Patient has already undergone cardiac risk assessment.  At this point we can move forward with scheduling surgery.  Eunice Blase will call him to confirm surgery date.  Risk benefits prognosis reviewed.  Follow-Up Instructions: No follow-ups on file.   Orders:  Orders Placed This Encounter  Procedures   XR HIP UNILAT W OR W/O PELVIS 2-3 VIEWS LEFT   No orders of the defined types were placed in this encounter.     Procedures: No procedures performed   Clinical Data: No additional findings.   Subjective: Chief Complaint  Patient presents with   Left Hip - Pain    HPI Kahne returns today for follow-up evaluation of left hip DJD.  Underwent a hip injection in August which was not very effective.  He continues to have severe symptoms that interferes with quality of life and ADLs. Review of Systems  Constitutional: Negative.   HENT: Negative.    Eyes: Negative.   Respiratory: Negative.    Cardiovascular: Negative.   Gastrointestinal: Negative.   Endocrine: Negative.   Genitourinary: Negative.   Skin: Negative.   Allergic/Immunologic: Negative.   Neurological: Negative.   Hematological: Negative.   Psychiatric/Behavioral: Negative.    All other systems reviewed and are negative.    Objective: Vital Signs: There were no vitals taken for this visit.  Physical Exam Vitals and nursing note reviewed.  Constitutional:      Appearance: He is well-developed.  HENT:     Head: Normocephalic and atraumatic.  Eyes:     Pupils: Pupils are equal, round, and reactive to light.  Pulmonary:     Effort: Pulmonary effort is normal.   Abdominal:     Palpations: Abdomen is soft.  Musculoskeletal:        General: Normal range of motion.     Cervical back: Neck supple.  Skin:    General: Skin is warm.  Neurological:     Mental Status: He is alert and oriented to person, place, and time.  Psychiatric:        Behavior: Behavior normal.        Thought Content: Thought content normal.        Judgment: Judgment normal.     Ortho Exam Examination of the left hip is unchanged from prior visit. Specialty Comments:  No specialty comments available.  Imaging: XR HIP UNILAT W OR W/O PELVIS 2-3 VIEWS LEFT Result Date: 09/19/2023 X-rays of the left hip show degenerative changes and subchondral cystic formation.  Cam deformity of the femoral head.    PMFS History: Patient Active Problem List   Diagnosis Date Noted   Aortic atherosclerosis (HCC) 07/15/2022   Benign prostatic hyperplasia without lower urinary tract symptoms 07/15/2022   Pre-diabetes 07/15/2022   No-show for appointment 08/16/2021   Loop recorder Biomonitor-Biotronik 05/25/2021 05/26/2021   Cryptogenic stroke (HCC) 05/26/2021   PAC (premature atrial contraction) 07/30/2020   Hospital discharge follow-up 04/07/2020   Retinal artery branch occlusion, 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   Retinal  artery occlusion 04/01/2020   CRAO (central retinal artery occlusion), left    Hypertension    Severe obstructive sleep apnea 12/13/2019   LVH (left ventricular hypertrophy) due to hypertensive disease 08/25/2019   Chronic diastolic heart failure (HCC) 08/25/2019   Hypertensive heart disease with chronic diastolic congestive heart failure (HCC) 08/25/2019   Severe obesity (BMI 35.0-39.9) with comorbidity (HCC) 07/24/2018   Moderate single current episode of major depressive disorder (HCC) 04/25/2016   Elevated hemoglobin A1c 01/18/2016   Dyslipidemia 01/12/2016   Essential hypertension 01/12/2016   Gastroesophageal reflux disease without esophagitis 01/12/2016   Vitamin D  deficiency 01/12/2016   Anxiety, generalized 03/30/2014   Hyperlipidemia 03/30/2014   Past Medical History:  Diagnosis Date   Central retinal artery occlusion of left eye    Chronic diastolic heart failure (HCC) 08/25/2019   Cryptogenic stroke (HCC) 05/26/2021   Depression    GERD (gastroesophageal reflux disease)    Hyperlipidemia    Hypertension    Loop recorder Biomonitor-Biotronik 05/25/2021 05/26/2021   LVH (left ventricular hypertrophy) due to hypertensive disease 08/25/2019   OSA (obstructive sleep apnea)    unable to tolerate CPAP   Retinal artery branch occlusion, 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    Family History  Problem Relation Age of Onset   Heart attack Mother    Alcohol abuse Father    Stomach cancer Sister    Hypertension Brother    Heart attack Brother    Brain cancer Brother    Heart attack Nephew 49       deceased from massive MI    Past Surgical History:  Procedure Laterality Date   COLONOSCOPY     Social History   Occupational History   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

## 2023-09-22 ENCOUNTER — Ambulatory Visit (INDEPENDENT_AMBULATORY_CARE_PROVIDER_SITE_OTHER): Payer: Medicare Other

## 2023-09-22 DIAGNOSIS — I639 Cerebral infarction, unspecified: Secondary | ICD-10-CM

## 2023-09-23 ENCOUNTER — Other Ambulatory Visit: Payer: Self-pay

## 2023-09-23 LAB — CUP PACEART REMOTE DEVICE CHECK
Date Time Interrogation Session: 20250203094209
Implantable Pulse Generator Implant Date: 20221007
Pulse Gen Model: 436066
Pulse Gen Serial Number: 94077085

## 2023-10-02 NOTE — Progress Notes (Signed)
Carelink Summary Report / Loop Recorder

## 2023-10-03 ENCOUNTER — Other Ambulatory Visit: Payer: Self-pay | Admitting: Physician Assistant

## 2023-10-03 MED ORDER — ONDANSETRON HCL 4 MG PO TABS: 4.0000 mg | ORAL_TABLET | Freq: Three times a day (TID) | ORAL | 0 refills | Status: DC | PRN

## 2023-10-03 MED ORDER — DOCUSATE SODIUM 100 MG PO CAPS: 100.0000 mg | ORAL_CAPSULE | Freq: Every day | ORAL | 2 refills | Status: DC | PRN

## 2023-10-03 MED ORDER — OXYCODONE-ACETAMINOPHEN 5-325 MG PO TABS: 1.0000 | ORAL_TABLET | Freq: Four times a day (QID) | ORAL | 0 refills | Status: DC | PRN

## 2023-10-03 MED ORDER — METHOCARBAMOL 750 MG PO TABS: 750.0000 mg | ORAL_TABLET | Freq: Two times a day (BID) | ORAL | 2 refills | Status: DC | PRN

## 2023-10-03 MED ORDER — ASPIRIN 81 MG PO CHEW: 81.0000 mg | CHEWABLE_TABLET | Freq: Two times a day (BID) | ORAL | 0 refills | Status: AC

## 2023-10-06 ENCOUNTER — Encounter (HOSPITAL_COMMUNITY): Payer: Self-pay

## 2023-10-06 NOTE — Pre-Procedure Instructions (Addendum)
Surgical Instructions   Your procedure is scheduled on October 13, 2023. Report to [facility] Main Entrance "A" at 6:30 A.M., then check in with the Admitting office. Any questions or running late day of surgery: call [phone]  Questions prior to your surgery date: call [phone], Monday-Friday, 8am-4pm. If you experience any cold or flu symptoms such as cough, fever, chills, shortness of breath, etc. between now and your scheduled surgery, please notify us at the above number.     Remember:  Do not eat after midnight the night before your surgery  You may drink clear liquids until 6:00AM the morning of your surgery.   Clear liquids allowed are: Water, Non-Citrus Juices (without pulp), Carbonated Beverages, Clear Tea (no milk, honey, etc.), Black Coffee Only (NO MILK, CREAM OR POWDERED CREAMER of any kind), and Gatorade.   Patient Instructions  The night before surgery:  No food after midnight. ONLY clear liquids after midnight  The day of surgery (if you do NOT have diabetes):  Drink ONE (1) Pre-Surgery Clear Ensure by 6:00AM the morning of surgery. Drink in one sitting. Do not sip.  This drink was given to you during your hospital  pre-op appointment visit.  Nothing else to drink after completing the  Pre-Surgery Clear Ensure.   Take these medicines the morning of surgery with A SIP OF WATER: busPIRone (BUSPAR)  escitalopram (LEXAPRO)   May take these medicines IF NEEDED: docusate sodium (COLACE)  methocarbamol (ROBAXIN-750)  ondansetron (ZOFRAN)  oxyCODONE-acetaminophen (PERCOCET)   Follow your surgeon's instructions regarding stopping ASPIRIN prior to surgery. If no instructions were given, please contact your surgeon's office.   One week prior to surgery, STOP taking any  Aleve, Naproxen, Ibuprofen, Motrin, Advil, Goody's, BC's, all herbal medications, fish oil, and non-prescription vitamins.                     Do NOT Smoke (Tobacco/Vaping) for 24 hours  prior to your procedure.  If you use a CPAP at night, you may bring your mask/headgear for your overnight stay.   You will be asked to remove any contacts, glasses, piercing's, hearing aid's, dentures/partials prior to surgery. Please bring cases for these items if needed.    Patients discharged the day of surgery will not be allowed to drive home, and someone needs to stay with them for 24 hours.  SURGICAL WAITING ROOM VISITATION Patients may have no more than 2 support people in the waiting area - these visitors may rotate.   Pre-op nurse will coordinate an appropriate time for 1 ADULT support person, who may not rotate, to accompany patient in pre-op.  Children under the age of 60 must have an adult with them who is not the patient and must remain in the main waiting area with an adult.  If the patient needs to stay at the hospital during part of their recovery, the visitor guidelines for inpatient rooms apply.  Please refer to the [facility] website for the visitor guidelines for any additional information.   If you received a COVID test during your pre-op visit  it is requested that you wear a mask when out in public, stay away from anyone that may not be feeling well and notify your surgeon if you develop symptoms. If you have been in contact with anyone that has tested positive in the last 10 days please notify you surgeon.      Pre-operative 5 CHG Bathing Instructions   You can play a key  role in reducing the risk of infection after surgery. Your skin needs to be as free of germs as possible. You can reduce the number of germs on your skin by washing with CHG (chlorhexidine gluconate) soap before surgery. CHG is an antiseptic soap that kills germs and continues to kill germs even after washing.   DO NOT use if you have an allergy to chlorhexidine/CHG or antibacterial soaps. If your skin becomes reddened or irritated, stop using the CHG and notify one of our RNs at [phone].    Please shower with the CHG soap starting 4 days before surgery using the following schedule:     Please keep in mind the following:  DO NOT shave, including legs and underarms, starting the day of your first shower.   You may shave your face at any point before/day of surgery.  Place clean sheets on your bed the day you start using CHG soap. Use a clean washcloth (not used since being washed) for each shower. DO NOT sleep with pets once you start using the CHG.   CHG Shower Instructions:  Wash your face and private area with normal soap. If you choose to wash your hair, wash first with your normal shampoo.  After you use shampoo/soap, rinse your hair and body thoroughly to remove shampoo/soap residue.  Turn the water OFF and apply about 3 tablespoons (45 ml) of CHG soap to a CLEAN washcloth.  Apply CHG soap ONLY FROM YOUR NECK DOWN TO YOUR TOES (washing for 3-5 minutes)  DO NOT use CHG soap on face, private areas, open wounds, or sores.  Pay special attention to the area where your surgery is being performed.  If you are having back surgery, having someone wash your back for you may be helpful. Wait 2 minutes after CHG soap is applied, then you may rinse off the CHG soap.  Pat dry with a clean towel  Put on clean clothes/pajamas   If you choose to wear lotion, please use ONLY the CHG-compatible lotions that are listed below.  Additional instructions for the day of surgery: DO NOT APPLY any lotions, deodorants, cologne, or perfumes.   Do not bring valuables to the hospital. [facility] is not responsible for any belongings/valuables. Do not wear nail polish, gel polish, artificial nails, or any other type of covering on natural nails (fingers and toes) Do not wear jewelry or makeup Put on clean/comfortable clothes.  Please brush your teeth.  Ask your nurse before applying any prescription medications to the skin.     CHG Compatible Lotions   Aveeno Moisturizing lotion   Cetaphil Moisturizing Cream  Cetaphil Moisturizing Lotion  Clairol Herbal Essence Moisturizing Lotion, Dry Skin  Clairol Herbal Essence Moisturizing Lotion, Extra Dry Skin  Clairol Herbal Essence Moisturizing Lotion, Normal Skin  Curel Age Defying Therapeutic Moisturizing Lotion with Alpha Hydroxy  Curel Extreme Care Body Lotion  Curel Soothing Hands Moisturizing Hand Lotion  Curel Therapeutic Moisturizing Cream, Fragrance-Free  Curel Therapeutic Moisturizing Lotion, Fragrance-Free  Curel Therapeutic Moisturizing Lotion, Original Formula  Eucerin Daily Replenishing Lotion  Eucerin Dry Skin Therapy Plus Alpha Hydroxy Crme  Eucerin Dry Skin Therapy Plus Alpha Hydroxy Lotion  Eucerin Original Crme  Eucerin Original Lotion  Eucerin Plus Crme Eucerin Plus Lotion  Eucerin TriLipid Replenishing Lotion  Keri Anti-Bacterial Hand Lotion  Keri Deep Conditioning Original Lotion Dry Skin Formula Softly Scented  Keri Deep Conditioning Original Lotion, Fragrance Free Sensitive Skin Formula  Keri Lotion Fast Absorbing Fragrance Free Sensitive Skin Formula  Keri Lotion Fast Absorbing Softly Scented Dry Skin Formula  Keri Original Lotion  Keri Skin Renewal Lotion Keri Silky Smooth Lotion  Keri Silky Smooth Sensitive Skin Lotion  Nivea Body Creamy Conditioning Patent examiner Moisturizing Lotion Nivea Crme  Nivea Skin Firming Lotion  NutraDerm 30 Skin Lotion  NutraDerm Skin Lotion  NutraDerm Therapeutic Skin Cream  NutraDerm Therapeutic Skin Lotion  ProShield Protective Hand Cream  Provon moisturizing lotion  Please read over the following fact sheets that you were given.

## 2023-10-06 NOTE — Progress Notes (Signed)
Patient's Girlfriend Youlanda Roys [phone] is in PAT room during Pre-Visit per patient's request.  PCP - Dr Delynn Flavin Cardiologist - Dr Maximino Greenland  Chest x-ray - n/a EKG - 07/14/23 Stress Test - 07/21/23 ECHO - 03/31/20 Cardiac Cath - n/a  Loop - Yes, Biotronik, last remote device check was on 09/22/23.  Left upper chest placement  Sleep Study -  Yes CPAP - unable to tolerate CPAP - does not use CPAP   Diabetes - n/a  Blood Thinner Instructions:  n/a  Aspirin Instructions: Follow your surgeon's instructions on when to stop aspirin prior to surgery,  If no instructions were given by your surgeon then you will need to call the office for those instructions.  ERAS - clear liquids til 6 AM DOS PRE-SURGERY Ensure given at PAT appt. with instructions,  Anesthesia review: Yes  STOP now taking any Aspirin (unless otherwise instructed by your surgeon), Aleve, Naproxen, Ibuprofen, Motrin, Advil, Goody's, BC's, all herbal medications, fish oil, and all vitamins.   Coronavirus Screening Do you have any of the following symptoms:  Cough yes/no: No Fever (>100.43F)  yes/no: No Runny nose yes/no: No Sore throat yes/no: No Difficulty breathing/shortness of breath  yes/no: No  Have you traveled in the last 14 days and where? yes/no: No  Patient verbalized understanding of instructions that were given to them at the PAT appointment. Patient was also instructed that they will need to review over the PAT instructions again at home before surgery.

## 2023-10-07 ENCOUNTER — Other Ambulatory Visit: Payer: Self-pay

## 2023-10-07 ENCOUNTER — Encounter (HOSPITAL_COMMUNITY)
Admission: RE | Admit: 2023-10-07 | Discharge: 2023-10-07 | Disposition: A | Discharge status: Home / Self Care | Payer: Medicare Other | Source: Ambulatory Visit | Attending: Orthopaedic Surgery | Admitting: Orthopaedic Surgery

## 2023-10-07 ENCOUNTER — Encounter (HOSPITAL_COMMUNITY): Payer: Self-pay

## 2023-10-07 VITALS — BP 123/61 | HR 73 | Temp 98.4°F | Resp 18 | Ht 68.0 in | Wt 235.6 lb

## 2023-10-07 DIAGNOSIS — Z01818 Encounter for other preprocedural examination: Secondary | ICD-10-CM | POA: Diagnosis not present

## 2023-10-07 DIAGNOSIS — M1612 Unilateral primary osteoarthritis, left hip: Secondary | ICD-10-CM | POA: Insufficient documentation

## 2023-10-07 HISTORY — DX: Prediabetes: R73.03

## 2023-10-07 HISTORY — DX: Dyspnea, unspecified: R06.00

## 2023-10-07 HISTORY — DX: Personal history of urinary calculi: Z87.442

## 2023-10-07 HISTORY — DX: Anxiety disorder, unspecified: F41.9

## 2023-10-07 HISTORY — DX: Unspecified osteoarthritis, unspecified site: M19.90

## 2023-10-07 HISTORY — DX: Pneumonia, unspecified organism: J18.9

## 2023-10-07 LAB — CBC
HCT: 41.4 % (ref 39.0–52.0)
Hemoglobin: 14.1 g/dL (ref 13.0–17.0)
MCH: 30.5 pg (ref 26.0–34.0)
MCHC: 34.1 g/dL (ref 30.0–36.0)
MCV: 89.6 fL (ref 80.0–100.0)
Platelets: 264 10*3/uL (ref 150–400)
RBC: 4.62 MIL/uL (ref 4.22–5.81)
RDW: 12.6 % (ref 11.5–15.5)
WBC: 7.9 10*3/uL (ref 4.0–10.5)
nRBC: 0 % (ref 0.0–0.2)

## 2023-10-07 LAB — BASIC METABOLIC PANEL
Anion gap: 11 (ref 5–15)
BUN: 16 mg/dL (ref 8–23)
CO2: 27 mmol/L (ref 22–32)
Calcium: 9.5 mg/dL (ref 8.9–10.3)
Chloride: 100 mmol/L (ref 98–111)
Creatinine, Ser: 1.2 mg/dL (ref 0.61–1.24)
GFR, Estimated: >60 mL/min (ref >60)
Glucose, Bld: 99 mg/dL (ref 70–99)
Potassium: 3.8 mmol/L (ref 3.5–5.1)
Sodium: 138 mmol/L (ref 135–145)

## 2023-10-07 LAB — SURGICAL PCR SCREEN
MRSA, PCR: NEGATIVE
Staphylococcus aureus: NEGATIVE

## 2023-10-09 NOTE — Anesthesia Preprocedure Evaluation (Addendum)
 Anesthesia Evaluation  Patient identified by MRN, date of birth, ID band Patient awake    Reviewed: Allergy & Precautions, H&P , NPO status , Patient's Chart, lab work & pertinent test results  Airway Mallampati: II   Neck ROM: full    Dental   Pulmonary sleep apnea , former smoker   breath sounds clear to auscultation       Cardiovascular hypertension, +CHF   Rhythm:regular Rate:Normal     Neuro/Psych  PSYCHIATRIC DISORDERS Anxiety Depression    CVA    GI/Hepatic ,GERD  ,,  Endo/Other    Renal/GU      Musculoskeletal  (+) Arthritis ,    Abdominal   Peds  Hematology   Anesthesia Other Findings   Reproductive/Obstetrics                             Anesthesia Physical Anesthesia Plan  ASA: 2  Anesthesia Plan: Spinal and MAC   Post-op Pain Management:    Induction: Intravenous  PONV Risk Score and Plan: 1 and Propofol infusion and Treatment may vary due to age or medical condition  Airway Management Planned: Simple Face Mask  Additional Equipment:   Intra-op Plan:   Post-operative Plan:   Informed Consent: I have reviewed the patients History and Physical, chart, labs and discussed the procedure including the risks, benefits and alternatives for the proposed anesthesia with the patient or authorized representative who has indicated his/her understanding and acceptance.     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 Loop recorder with no significant abnormalities to date. Last seen by Eligha Bridegroom, NP on 07/14/23. At that time nuclear stress was ordered for preop risk stratification. Stress test 07/21/23 was  nonischemic, low risk, EF 58%. He was cleared to proceed with surgery.  Other pertinent hx includes GERD, former smoker (23 pack years, quit 1992).  Proep labs reviewed, unremarkable.  EKG 07/14/23: Normal sinus rhythm. Rate 63. Right bundle branch block  Nuclear stress 07/21/23:    The study is normal. The study is low risk.   No ST deviation was noted.   LV perfusion is normal.   Left ventricular function is normal. Nuclear stress EF: 58%. The left ventricular ejection fraction is normal (55-65%). End diastolic cavity size is normal. End systolic cavity size is normal.   Prior study available for comparison from 07/12/2019.   Electronically signed by Thurmon Fair, MD  Low risk stress nuclear study with normal perfusion and normal left ventricular regional and global systolic function.  TTE 03/31/20: 1. Left ventricular ejection fraction, by estimation, is 60 to 65%. The  left ventricle has normal function. The left ventricle has no regional  wall motion abnormalities. Left ventricular diastolic parameters are  consistent with Grade I diastolic  dysfunction (impaired relaxation).  2. Right ventricular systolic function is normal. The right ventricular  size is normal.  3. The mitral valve is normal in structure. No evidence of mitral valve  regurgitation. No evidence of mitral stenosis.  4. The aortic valve is normal in structure. Aortic valve regurgitation is  not visualized. No aortic stenosis is present.  5. The inferior vena cava is normal in size with greater than 50%  respiratory variability, suggesting right  atrial pressure of 3 mmHg.   )        Anesthesia Quick Evaluation

## 2023-10-09 NOTE — Progress Notes (Signed)
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 Loop recorder with no significant abnormalities to date. Last seen by Eligha Bridegroom, NP on 07/14/23. At that time nuclear stress was ordered for preop risk stratification. Stress test 07/21/23 was nonischemic, low risk, EF 58%. He was cleared to proceed with surgery.  Other pertinent hx includes GERD, former smoker (23 pack years, quit 1992).  Proep labs reviewed, unremarkable.  EKG 07/14/23: Normal sinus rhythm. Rate 63. Right bundle branch block  Nuclear stress 07/21/23:    The study is normal. The study is low risk.   No ST deviation was noted.   LV perfusion is normal.   Left ventricular function is normal. Nuclear stress EF: 58%. The left ventricular ejection fraction is normal (55-65%). End diastolic cavity size is normal. End systolic cavity size is normal.   Prior study available for comparison from 07/12/2019.   Electronically signed by Thurmon Fair, MD   Low risk stress nuclear study with normal perfusion and normal left ventricular regional and global systolic function.  TTE 03/31/20: 1. Left ventricular ejection fraction, by estimation, is 60 to 65%. The  left ventricle has normal function. The left ventricle has no regional  wall motion abnormalities. Left ventricular diastolic parameters are  consistent with Grade I diastolic  dysfunction (impaired relaxation).   2. Right ventricular systolic function is normal. The right ventricular  size is normal.   3. The mitral valve is normal in structure. No evidence of mitral valve  regurgitation. No evidence of mitral stenosis.   4. The aortic valve is normal in structure. Aortic valve regurgitation is  not visualized. No aortic stenosis is present.   5. The inferior vena cava is normal in size with greater than 50%  respiratory  variability, suggesting right atrial pressure of 3 mmHg.     [name] [facility]/Anesthesiology Phone [phone] 10/09/2023 11:02 AM

## 2023-10-10 MED ORDER — TRANEXAMIC ACID 1000 MG/10ML IV SOLN
2000.0000 mg | INTRAVENOUS | Status: DC
  Filled 2023-10-10: qty 20

## 2023-10-11 DIAGNOSIS — M1612 Unilateral primary osteoarthritis, left hip: Secondary | ICD-10-CM | POA: Insufficient documentation

## 2023-10-12 ENCOUNTER — Telehealth: Payer: Self-pay | Admitting: *Deleted

## 2023-10-12 NOTE — Care Plan (Signed)
 [facility] RNCM call to patient to discuss his upcoming Left total hip arthroplasty with Dr. Roda Shutters on 10/13/23 at [facility]. He is agreeable to case management. He will be going to his girlfriend, Linda's, home after surgery in [place]. He has a RW and 3in1/BSC. He has been delivered an ice wrap per [facility] solutions via mail and will not need an ice man ice machine. Anticipate HHPT will be needed after a short hospital stay. Referral to [facility] after choice provided. Reviewed all post op care instructions. Will continue to follow for needs.

## 2023-10-12 NOTE — Telephone Encounter (Signed)
 [facility] pre-op call completed.

## 2023-10-13 ENCOUNTER — Encounter (HOSPITAL_COMMUNITY): Payer: Self-pay | Admitting: Orthopaedic Surgery

## 2023-10-13 ENCOUNTER — Ambulatory Visit (HOSPITAL_COMMUNITY): Payer: Medicare Other | Admitting: Physician Assistant

## 2023-10-13 ENCOUNTER — Observation Stay (HOSPITAL_COMMUNITY)
Admission: RE | Admit: 2023-10-13 | Discharge: 2023-10-14 | Disposition: A | Discharge status: Home / Self Care | Payer: Medicare Other | Attending: Orthopaedic Surgery | Admitting: Orthopaedic Surgery

## 2023-10-13 ENCOUNTER — Encounter (HOSPITAL_COMMUNITY)
Admission: RE | Disposition: A | Discharge status: Home / Self Care | Payer: Self-pay | Source: Home / Self Care | Attending: Orthopaedic Surgery

## 2023-10-13 ENCOUNTER — Other Ambulatory Visit: Payer: Self-pay

## 2023-10-13 ENCOUNTER — Ambulatory Visit (HOSPITAL_COMMUNITY): Payer: Medicare Other | Admitting: Certified Registered"

## 2023-10-13 ENCOUNTER — Ambulatory Visit (HOSPITAL_COMMUNITY): Payer: Medicare Other

## 2023-10-13 ENCOUNTER — Observation Stay (HOSPITAL_COMMUNITY): Payer: Medicare Other

## 2023-10-13 DIAGNOSIS — Z96642 Presence of left artificial hip joint: Secondary | ICD-10-CM | POA: Diagnosis not present

## 2023-10-13 DIAGNOSIS — I110 Hypertensive heart disease with heart failure: Secondary | ICD-10-CM | POA: Insufficient documentation

## 2023-10-13 DIAGNOSIS — Z79899 Other long term (current) drug therapy: Secondary | ICD-10-CM | POA: Insufficient documentation

## 2023-10-13 DIAGNOSIS — I5032 Chronic diastolic (congestive) heart failure: Secondary | ICD-10-CM | POA: Insufficient documentation

## 2023-10-13 DIAGNOSIS — M1612 Unilateral primary osteoarthritis, left hip: Secondary | ICD-10-CM | POA: Diagnosis not present

## 2023-10-13 DIAGNOSIS — Z7982 Long term (current) use of aspirin: Secondary | ICD-10-CM | POA: Insufficient documentation

## 2023-10-13 DIAGNOSIS — Z87891 Personal history of nicotine dependence: Secondary | ICD-10-CM | POA: Diagnosis not present

## 2023-10-13 DIAGNOSIS — Z8673 Personal history of transient ischemic attack (TIA), and cerebral infarction without residual deficits: Secondary | ICD-10-CM | POA: Insufficient documentation

## 2023-10-13 DIAGNOSIS — M25552 Pain in left hip: Secondary | ICD-10-CM | POA: Diagnosis not present

## 2023-10-13 DIAGNOSIS — Z471 Aftercare following joint replacement surgery: Secondary | ICD-10-CM | POA: Diagnosis not present

## 2023-10-13 HISTORY — PX: TOTAL HIP ARTHROPLASTY: SHX124

## 2023-10-13 LAB — TYPE AND SCREEN
ABO/RH(D): A POS
Antibody Screen: NEGATIVE

## 2023-10-13 LAB — ABO/RH: ABO/RH(D): A POS

## 2023-10-13 SURGERY — ARTHROPLASTY, HIP, TOTAL, ANTERIOR APPROACH
Anesthesia: Monitor Anesthesia Care | Site: Hip | Laterality: Left

## 2023-10-13 MED ORDER — VANCOMYCIN HCL 1 G IV SOLR
INTRAVENOUS | Status: DC | PRN
  Administered 2023-10-13: 1000 mg via TOPICAL

## 2023-10-13 MED ORDER — HYDROCHLOROTHIAZIDE 12.5 MG PO TABS
12.5000 mg | ORAL_TABLET | Freq: Every day | ORAL | Status: DC
  Administered 2023-10-14: 12.5 mg via ORAL
  Filled 2023-10-13: qty 1

## 2023-10-13 MED ORDER — FENTANYL CITRATE (PF) 100 MCG/2ML IJ SOLN: 25.0000 ug | INTRAMUSCULAR | Status: DC | PRN

## 2023-10-13 MED ORDER — BUPIVACAINE IN DEXTROSE 0.75-8.25 % IT SOLN
INTRATHECAL | Status: DC | PRN
  Administered 2023-10-13: 2 mL via INTRATHECAL

## 2023-10-13 MED ORDER — DIPHENHYDRAMINE HCL 12.5 MG/5ML PO ELIX: 25.0000 mg | ORAL_SOLUTION | ORAL | Status: DC | PRN

## 2023-10-13 MED ORDER — PANTOPRAZOLE SODIUM 40 MG PO TBEC
40.0000 mg | DELAYED_RELEASE_TABLET | Freq: Every day | ORAL | Status: DC
  Administered 2023-10-13 – 2023-10-14 (×2): 40 mg via ORAL
  Filled 2023-10-13 (×2): qty 1

## 2023-10-13 MED ORDER — PROPOFOL 500 MG/50ML IV EMUL
INTRAVENOUS | Status: DC | PRN
  Administered 2023-10-13: 75 ug/kg/min via INTRAVENOUS

## 2023-10-13 MED ORDER — METOCLOPRAMIDE HCL 5 MG PO TABS: 5.0000 mg | ORAL_TABLET | Freq: Three times a day (TID) | ORAL | Status: DC | PRN

## 2023-10-13 MED ORDER — TRANEXAMIC ACID-NACL 1000-0.7 MG/100ML-% IV SOLN
1000.0000 mg | INTRAVENOUS | Status: DC
  Filled 2023-10-13: qty 100

## 2023-10-13 MED ORDER — ALUM & MAG HYDROXIDE-SIMETH 200-200-20 MG/5ML PO SUSP: 30.0000 mL | ORAL | Status: DC | PRN

## 2023-10-13 MED ORDER — LOSARTAN POTASSIUM-HCTZ 50-12.5 MG PO TABS: 1.0000 | ORAL_TABLET | Freq: Every day | ORAL | Status: DC

## 2023-10-13 MED ORDER — BUPIVACAINE-MELOXICAM ER 400-12 MG/14ML IJ SOLN
INTRAMUSCULAR | Status: AC
  Filled 2023-10-13: qty 1

## 2023-10-13 MED ORDER — OXYCODONE HCL ER 10 MG PO T12A
10.0000 mg | EXTENDED_RELEASE_TABLET | Freq: Two times a day (BID) | ORAL | Status: DC
Start: 2023-10-13 — End: 2023-10-14
  Administered 2023-10-13 (×2): 10 mg via ORAL
  Filled 2023-10-13 (×2): qty 1

## 2023-10-13 MED ORDER — DEXAMETHASONE SODIUM PHOSPHATE 10 MG/ML IJ SOLN
10.0000 mg | Freq: Once | INTRAMUSCULAR | Status: AC
  Administered 2023-10-14: 10 mg via INTRAVENOUS
  Filled 2023-10-13: qty 1

## 2023-10-13 MED ORDER — ACETAMINOPHEN 325 MG PO TABS: 325.0000 mg | ORAL_TABLET | Freq: Four times a day (QID) | ORAL | Status: DC | PRN

## 2023-10-13 MED ORDER — METHOCARBAMOL 1000 MG/10ML IJ SOLN: 500.0000 mg | Freq: Four times a day (QID) | INTRAMUSCULAR | Status: DC | PRN

## 2023-10-13 MED ORDER — MAGNESIUM CITRATE PO SOLN: 1.0000 | Freq: Once | ORAL | Status: DC | PRN

## 2023-10-13 MED ORDER — CHLORHEXIDINE GLUCONATE 0.12 % MT SOLN
15.0000 mL | Freq: Once | OROMUCOSAL | Status: AC
  Administered 2023-10-13: 15 mL via OROMUCOSAL
  Filled 2023-10-13: qty 15

## 2023-10-13 MED ORDER — OXYCODONE HCL 5 MG PO TABS
5.0000 mg | ORAL_TABLET | ORAL | Status: DC | PRN
  Administered 2023-10-13 – 2023-10-14 (×4): 10 mg via ORAL
  Filled 2023-10-13 (×4): qty 2

## 2023-10-13 MED ORDER — 0.9 % SODIUM CHLORIDE (POUR BTL) OPTIME
TOPICAL | Status: DC | PRN
  Administered 2023-10-13: 1000 mL

## 2023-10-13 MED ORDER — FENTANYL CITRATE (PF) 250 MCG/5ML IJ SOLN
INTRAMUSCULAR | Status: DC | PRN
  Administered 2023-10-13: 50 ug via INTRAVENOUS

## 2023-10-13 MED ORDER — LACTATED RINGERS IV SOLN: INTRAVENOUS | Status: DC

## 2023-10-13 MED ORDER — HYDROMORPHONE HCL 1 MG/ML IJ SOLN: 0.5000 mg | INTRAMUSCULAR | Status: DC | PRN

## 2023-10-13 MED ORDER — BUPIVACAINE-EPINEPHRINE (PF) 0.25% -1:200000 IJ SOLN
INTRAMUSCULAR | Status: AC
Start: 2023-10-13 — End: ?
  Filled 2023-10-13: qty 30

## 2023-10-13 MED ORDER — FENTANYL CITRATE (PF) 250 MCG/5ML IJ SOLN
INTRAMUSCULAR | Status: AC
  Filled 2023-10-13: qty 5

## 2023-10-13 MED ORDER — MIDAZOLAM HCL 2 MG/2ML IJ SOLN
INTRAMUSCULAR | Status: AC
  Filled 2023-10-13: qty 2

## 2023-10-13 MED ORDER — MIDAZOLAM HCL 2 MG/2ML IJ SOLN
INTRAMUSCULAR | Status: DC | PRN
  Administered 2023-10-13: 2 mg via INTRAVENOUS

## 2023-10-13 MED ORDER — ASPIRIN 81 MG PO CHEW
81.0000 mg | CHEWABLE_TABLET | Freq: Two times a day (BID) | ORAL | Status: DC
  Administered 2023-10-13 – 2023-10-14 (×2): 81 mg via ORAL
  Filled 2023-10-13 (×2): qty 1

## 2023-10-13 MED ORDER — ORAL CARE MOUTH RINSE: 15.0000 mL | Freq: Once | OROMUCOSAL | Status: AC

## 2023-10-13 MED ORDER — PHENYLEPHRINE HCL-NACL 20-0.9 MG/250ML-% IV SOLN
INTRAVENOUS | Status: DC | PRN
  Administered 2023-10-13: 45 ug/min via INTRAVENOUS

## 2023-10-13 MED ORDER — BUPIVACAINE LIPOSOME 1.3 % IJ SUSP
INTRAMUSCULAR | Status: AC
  Filled 2023-10-13: qty 20

## 2023-10-13 MED ORDER — METOCLOPRAMIDE HCL 5 MG/ML IJ SOLN: 5.0000 mg | Freq: Three times a day (TID) | INTRAMUSCULAR | Status: DC | PRN

## 2023-10-13 MED ORDER — DOCUSATE SODIUM 100 MG PO CAPS
100.0000 mg | ORAL_CAPSULE | Freq: Two times a day (BID) | ORAL | Status: DC
  Administered 2023-10-13 – 2023-10-14 (×2): 100 mg via ORAL
  Filled 2023-10-13 (×2): qty 1

## 2023-10-13 MED ORDER — ACETAMINOPHEN 500 MG PO TABS
1000.0000 mg | ORAL_TABLET | Freq: Four times a day (QID) | ORAL | Status: AC
  Administered 2023-10-13 – 2023-10-14 (×4): 1000 mg via ORAL
  Filled 2023-10-13 (×4): qty 2

## 2023-10-13 MED ORDER — PRONTOSAN WOUND IRRIGATION OPTIME
TOPICAL | Status: DC | PRN
  Administered 2023-10-13: 500 mL

## 2023-10-13 MED ORDER — SODIUM CHLORIDE 0.9 % IR SOLN
Status: DC | PRN
  Administered 2023-10-13: 1000 mL

## 2023-10-13 MED ORDER — SODIUM CHLORIDE (PF) 0.9 % IJ SOLN
INTRAMUSCULAR | Status: AC
  Filled 2023-10-13: qty 20

## 2023-10-13 MED ORDER — CEFAZOLIN SODIUM-DEXTROSE 2-4 GM/100ML-% IV SOLN
2.0000 g | INTRAVENOUS | Status: AC
  Administered 2023-10-13: 2 g via INTRAVENOUS
  Filled 2023-10-13: qty 100

## 2023-10-13 MED ORDER — EPHEDRINE 5 MG/ML INJ
INTRAVENOUS | Status: AC
  Filled 2023-10-13: qty 5

## 2023-10-13 MED ORDER — TRANEXAMIC ACID-NACL 1000-0.7 MG/100ML-% IV SOLN
INTRAVENOUS | Status: AC
  Filled 2023-10-13: qty 200

## 2023-10-13 MED ORDER — OXYCODONE HCL 5 MG PO TABS
10.0000 mg | ORAL_TABLET | ORAL | Status: DC | PRN
Start: 2023-10-13 — End: 2023-10-14

## 2023-10-13 MED ORDER — SORBITOL 70 % SOLN
30.0000 mL | Freq: Every day | Status: DC | PRN
  Filled 2023-10-13: qty 30

## 2023-10-13 MED ORDER — PHENOL 1.4 % MT LIQD: 1.0000 | OROMUCOSAL | Status: DC | PRN

## 2023-10-13 MED ORDER — TRANEXAMIC ACID 1000 MG/10ML IV SOLN
INTRAVENOUS | Status: DC | PRN
  Administered 2023-10-13: 2000 mg via TOPICAL

## 2023-10-13 MED ORDER — OXYCODONE HCL 5 MG PO TABS: 5.0000 mg | ORAL_TABLET | Freq: Once | ORAL | Status: DC | PRN

## 2023-10-13 MED ORDER — MENTHOL 3 MG MT LOZG: 1.0000 | LOZENGE | OROMUCOSAL | Status: DC | PRN

## 2023-10-13 MED ORDER — OXYCODONE HCL 5 MG/5ML PO SOLN: 5.0000 mg | Freq: Once | ORAL | Status: DC | PRN

## 2023-10-13 MED ORDER — TRANEXAMIC ACID-NACL 1000-0.7 MG/100ML-% IV SOLN
1000.0000 mg | Freq: Once | INTRAVENOUS | Status: AC
  Administered 2023-10-13: 1000 mg via INTRAVENOUS
  Filled 2023-10-13: qty 100

## 2023-10-13 MED ORDER — LOSARTAN POTASSIUM 50 MG PO TABS
50.0000 mg | ORAL_TABLET | Freq: Every day | ORAL | Status: DC
  Administered 2023-10-14: 50 mg via ORAL
  Filled 2023-10-13: qty 1

## 2023-10-13 MED ORDER — CEFAZOLIN SODIUM-DEXTROSE 2-4 GM/100ML-% IV SOLN
2.0000 g | Freq: Four times a day (QID) | INTRAVENOUS | Status: AC
  Administered 2023-10-13 (×2): 2 g via INTRAVENOUS
  Filled 2023-10-13 (×2): qty 100

## 2023-10-13 MED ORDER — POLYETHYLENE GLYCOL 3350 17 G PO PACK
17.0000 g | PACK | Freq: Every day | ORAL | Status: DC
  Administered 2023-10-14: 17 g via ORAL
  Filled 2023-10-13: qty 1

## 2023-10-13 MED ORDER — ONDANSETRON HCL 4 MG/2ML IJ SOLN: 4.0000 mg | Freq: Four times a day (QID) | INTRAMUSCULAR | Status: DC | PRN

## 2023-10-13 MED ORDER — EPHEDRINE SULFATE-NACL 50-0.9 MG/10ML-% IV SOSY
PREFILLED_SYRINGE | INTRAVENOUS | Status: DC | PRN
  Administered 2023-10-13: 5 mg via INTRAVENOUS

## 2023-10-13 MED ORDER — VANCOMYCIN HCL 1000 MG IV SOLR
INTRAVENOUS | Status: AC
Start: 2023-10-13 — End: ?
  Filled 2023-10-13: qty 20

## 2023-10-13 MED ORDER — ONDANSETRON HCL 4 MG PO TABS: 4.0000 mg | ORAL_TABLET | Freq: Four times a day (QID) | ORAL | Status: DC | PRN

## 2023-10-13 MED ORDER — PROPOFOL 1000 MG/100ML IV EMUL
INTRAVENOUS | Status: AC
  Filled 2023-10-13: qty 100

## 2023-10-13 MED ORDER — METHOCARBAMOL 500 MG PO TABS
500.0000 mg | ORAL_TABLET | Freq: Four times a day (QID) | ORAL | Status: DC | PRN
  Administered 2023-10-13 – 2023-10-14 (×3): 500 mg via ORAL
  Filled 2023-10-13 (×3): qty 1

## 2023-10-13 MED ORDER — POVIDONE-IODINE 10 % EX SWAB
2.0000 | Freq: Once | CUTANEOUS | Status: AC
  Administered 2023-10-13: 2 via TOPICAL

## 2023-10-13 SURGICAL SUPPLY — 54 items
BAG COUNTER SPONGE SURGICOUNT (BAG) ×1 IMPLANT
BAG DECANTER FOR FLEXI CONT (MISCELLANEOUS) ×1 IMPLANT
BLADE SAG 18X100X1.27 (BLADE) ×1 IMPLANT
COVER PERINEAL POST (MISCELLANEOUS) ×1 IMPLANT
COVER SURGICAL LIGHT HANDLE (MISCELLANEOUS) ×1 IMPLANT
CUP ACET PNNCL SECTR W/GRIP 56 (Hips) IMPLANT
DERMABOND ADVANCED .7 DNX12 (GAUZE/BANDAGES/DRESSINGS) IMPLANT
DRAPE C-ARM 42X72 X-RAY (DRAPES) ×1 IMPLANT
DRAPE POUCH INSTRU U-SHP 10X18 (DRAPES) ×1 IMPLANT
DRAPE STERI IOBAN 125X83 (DRAPES) ×1 IMPLANT
DRAPE U-SHAPE 47X51 STRL (DRAPES) ×2 IMPLANT
DRSG AQUACEL AG ADV 3.5X10 (GAUZE/BANDAGES/DRESSINGS) ×1 IMPLANT
DURAPREP 26ML APPLICATOR (WOUND CARE) ×2 IMPLANT
ELECT BLADE 4.0 EZ CLEAN MEGAD (MISCELLANEOUS) ×1
ELECT REM PT RETURN 9FT ADLT (ELECTROSURGICAL) ×1
ELECTRODE BLDE 4.0 EZ CLN MEGD (MISCELLANEOUS) ×1 IMPLANT
ELECTRODE REM PT RTRN 9FT ADLT (ELECTROSURGICAL) ×1 IMPLANT
GLOVE BIOGEL PI IND STRL 7.0 (GLOVE) ×2 IMPLANT
GLOVE BIOGEL PI IND STRL 7.5 (GLOVE) ×5 IMPLANT
GLOVE ECLIPSE 7.0 STRL STRAW (GLOVE) ×2 IMPLANT
GLOVE SKINSENSE STRL SZ7.5 (GLOVE) ×1 IMPLANT
GLOVE SURG SYN 7.5 E (GLOVE) ×2
GLOVE SURG SYN 7.5 PF PI (GLOVE) ×2 IMPLANT
GLOVE SURG UNDER POLY LF SZ7 (GLOVE) ×3 IMPLANT
GLOVE SURG UNDER POLY LF SZ7.5 (GLOVE) ×2 IMPLANT
GOWN STRL REUS W/ TWL LRG LVL3 (GOWN DISPOSABLE) IMPLANT
GOWN STRL REUS W/ TWL XL LVL3 (GOWN DISPOSABLE) ×1 IMPLANT
GOWN STRL SURGICAL XL XLNG (GOWN DISPOSABLE) ×1 IMPLANT
GOWN TOGA ZIPPER T7+ PEEL AWAY (MISCELLANEOUS) ×1 IMPLANT
HEAD FEM CER 40 +8.5 12/14 (Head) IMPLANT
HOOD PEEL AWAY T7 (MISCELLANEOUS) ×1 IMPLANT
IV NS IRRIG 3000ML ARTHROMATIC (IV SOLUTION) ×1 IMPLANT
KIT BASIN OR (CUSTOM PROCEDURE TRAY) ×1 IMPLANT
LINER NEUTRAL HIP ALTRX 40 56 (Liner) IMPLANT
MARKER SKIN DUAL TIP RULER LAB (MISCELLANEOUS) ×1 IMPLANT
NDL SPNL 18GX3.5 QUINCKE PK (NEEDLE) ×1 IMPLANT
NEEDLE SPNL 18GX3.5 QUINCKE PK (NEEDLE) ×1
PACK TOTAL JOINT (CUSTOM PROCEDURE TRAY) ×1 IMPLANT
PACK UNIVERSAL I (CUSTOM PROCEDURE TRAY) ×1 IMPLANT
PINN SECTOR W/GRIP ACE CUP 56 (Hips) ×1 IMPLANT
SET HNDPC FAN SPRY TIP SCT (DISPOSABLE) ×1 IMPLANT
SOLUTION PRONTOSAN WOUND 350ML (IRRIGATION / IRRIGATOR) ×1 IMPLANT
STEM FEM ACTIS STD SZ4 (Stem) IMPLANT
SUT ETHIBOND 2 V 37 (SUTURE) ×1 IMPLANT
SUT STRATAFIX PDS+0 CT1 9 (SUTURE) IMPLANT
SUT VIC AB 0 CT1 27XBRD ANBCTR (SUTURE) ×1 IMPLANT
SUT VIC AB 1 CTX36XBRD ANBCTR (SUTURE) ×1 IMPLANT
SUT VIC AB 2-0 CT1 TAPERPNT 27 (SUTURE) ×2 IMPLANT
SYR 50ML LL SCALE MARK (SYRINGE) ×1 IMPLANT
TOWEL GREEN STERILE (TOWEL DISPOSABLE) ×1 IMPLANT
TRAY CATH INTERMITTENT SS 16FR (CATHETERS) IMPLANT
TRAY FOLEY W/BAG SLVR 16FR ST (SET/KITS/TRAYS/PACK) IMPLANT
TUBE SUCT ARGYLE STRL (TUBING) ×1 IMPLANT
YANKAUER SUCT BULB TIP NO VENT (SUCTIONS) ×1 IMPLANT

## 2023-10-13 NOTE — Anesthesia Procedure Notes (Signed)
 Spinal  Patient location during procedure: OR Start time: 10/13/2023 8:51 AM End time: 10/13/2023 8:53 AM Reason for block: surgical anesthesia Staffing Performed: anesthesiologist  Anesthesiologist: Achille Rich, MD Performed by: Achille Rich, MD Authorized by: Achille Rich, MD   Preanesthetic Checklist Completed: patient identified, IV checked, risks and benefits discussed, surgical consent, monitors and equipment checked, pre-op evaluation and timeout performed Spinal Block Patient position: sitting Prep: DuraPrep Patient monitoring: cardiac monitor, continuous pulse ox and blood pressure Approach: midline Location: L3-4 Injection technique: single-shot Needle Needle type: Pencan  Needle gauge: 24 G Needle length: 9 cm Assessment Sensory level: T10 Events: CSF return Additional Notes Functioning IV was confirmed and monitors were applied. Sterile prep and drape, including hand hygiene and sterile gloves were used. The patient was positioned and the spine was prepped. The skin was anesthetized with lidocaine.  Free flow of clear CSF was obtained prior to injecting local anesthetic into the CSF.  The spinal needle aspirated freely following injection.  The needle was carefully withdrawn.  The patient tolerated the procedure well.

## 2023-10-13 NOTE — Op Note (Signed)
 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 Total hip replacement; Left hip; uncemented [id number]   Surgeon: Gershon Mussel, M.D.  Assist: Oneal Grout, PA-C   Anesthesia: spinal  Prosthesis: Depuy Acetabulum: Pinnacle 56 mm Femur: Actis 4 STD Head: 40 mm size: +8.5 Liner: +4 Bearing Type: ceramic/poly  Total Hip Arthroplasty (Anterior Approach) Op Note:  After informed consent was obtained and the operative extremity marked in the holding area, the patient was brought back to the operating room and placed supine on the HANA table. Next, the operative extremity was prepped and draped in normal sterile fashion. Surgical timeout occurred verifying patient identification, surgical site, surgical procedure and administration of antibiotics.  A 10 cm longitudinal incision was made starting from 2 fingerbreadths lateral and inferior to the ASIS towards the lateral aspect of the patella.  A Hueter approach to the hip was performed, using the interval between tensor fascia lata and sartorius.  Dissection was carried bluntly down onto the anterior hip capsule. The lateral femoral circumflex vessels were identified and coagulated. A capsulotomy was performed and the capsular flaps tagged for later repair.  The neck osteotomy was performed. The femoral head was removed which showed chondromalacia, the acetabular rim was cleared of soft tissue and osteophytes and attention was turned to reaming the acetabulum.  Sequential reaming was performed under fluoroscopic guidance down to the floor of the cotyloid fossa. We reamed to a size 55 mm, and then impacted the acetabular shell.  The liner was then placed after irrigation and attention turned to the femur.   After placing the femoral hook, the leg was taken to externally rotated, extended and adducted position taking care to perform soft tissue releases to allow for adequate mobilization of the femur. Soft tissue was cleared from the shoulder of the greater trochanter and the hook elevator used to improve exposure of the proximal femur. Sequential broaching performed up to a size 4. Trial neck and head were placed. The leg was brought back up to neutral and the construct reduced.  The position and sizing of components, offset and leg lengths were checked using fluoroscopy. Stability of the construct was checked in 45 degrees of hip extension and 90 degrees of external rotation without any subluxation, shuck or impingement of prosthesis. We dislocated the prosthesis, dropped the leg back into position, removed trial components, and irrigated copiously. The final stem and head was then placed, the leg brought back up, the system reduced and fluoroscopy used to verify positioning.  Antibiotic irrigation was placed in the surgical wound.   We irrigated, obtained hemostasis and closed the capsule using #2 ethibond suture.  A topical mixture of 0.25% bupivacaine and meloxicam was placed deep to the fascia.  One gram of vancomycin powder was placed in the surgical bed.   One gram of topical tranexamic acid was injected into the joint.  The fascia was closed with running # 0 stratafix =, the deep fat layer was closed with 0 vicryl, the subcutaneous layers closed with 2.0 Vicryl Plus and the skin closed with 3.0 monocryl and steri strips. A sterile dressing was applied. The patient was awakened in the operating room and taken to recovery in stable condition.  All sponge, needle, and instrument counts were correct at the end  of the case.   Tessa Lerner, my PA, was a medical necessity for opening, closing, limb positioning, retracting, exposing, and overall facilitation and timely completion of the surgery.  Position:  supine  Complications: see description of procedure.  Time Out: performed   Drains/Packing: none  Estimated blood loss: see anesthesia record  Returned to Recovery Room: in good condition.   Antibiotics: yes   Mechanical VTE (DVT) Prophylaxis: sequential compression devices, TED thigh-high  Chemical VTE (DVT) Prophylaxis: aspirin   Fluid Replacement: see anesthesia record  Specimens Removed: 1 to pathology   Sponge and Instrument Count Correct? yes   PACU: portable radiograph - low AP   Plan/RTC: Return in 2 weeks for staple removal. Weight Bearing/Load Lower Extremity: full  Hip precautions: none Suture Removal: 2 weeks   N. Glee Arvin, MD [facility] 10:28 AM   Implant Name Type Inv. Item Serial No. Manufacturer Lot No. LRB No. Used Action  PINN SECTOR W/GRIP ACE CUP 56 - [id number] Hips PINN SECTOR W/GRIP ACE CUP 56  [employer]PU[employer]TH[employer] [id number] Left 1 Implanted  LINER NEUTRAL HIP ALTRX 40 56 - [id number] Liner LINER NEUTRAL HIP ALTRX 40 56  DEPU[employer] ORTHOPA[employer]  Left 1 Implanted  Articul/Eze Femoral Head 12/14 Taper Ceramic    DEPUY ORTHOPAEDICS [id number] Left 1 Implanted  STEM FEM ACTIS STD SZ4 - [id number] Stem STEM FEM ACTIS STD SZ4  DEPUY ORTHOPAEDICS [id number] Left 1 Implanted

## 2023-10-13 NOTE — Progress Notes (Signed)
 Transition of Care [facility]) - Inpatient Brief Assessment   Patient Details  Name: Derek Ryan MRN: [id number] Date of Birth: Nov 30, 1955  Transition of Care [facility]) CM/SW Contact:    Ronny Bacon, RN Phone Number: 10/13/2023, 2:46 PM   Clinical Narrative: Patient from home in with hip surgery. Spoke with patient and family at bedside. Patient confirmed that he has a rolling walker at home, would like the bedside commode.  HH prearranged through office with [facility]. Staff on unit will supply [facility] for patient to take home at discharge.   Transition of Care Asessment: Insurance and Status: (P) Insurance coverage has been reviewed Patient has primary care physician: (P) Yes Home environment has been reviewed: (P) House Prior level of function:: (P) Independent Prior/Current Home Services: (P)  [facility] HH set up through doctor office) Social Drivers of Health Review: (P) SDOH reviewed no interventions necessary Readmission risk has been reviewed: (P) Yes Transition of care needs: (P) no transition of care needs at this time

## 2023-10-13 NOTE — Discharge Instructions (Signed)

## 2023-10-13 NOTE — Transfer of Care (Signed)
 Immediate Anesthesia Transfer of Care Note  Patient: Derek Ryan  Procedure(s) Performed: LEFT TOTAL HIP ARTHROPLASTY ANTERIOR APPROACH (Left: Hip)  Patient Location: PACU  Anesthesia Type:Spinal  Level of Consciousness: awake, alert , and oriented  Airway & Oxygen Therapy: Patient Spontanous Breathing and Patient connected to face mask oxygen  Post-op Assessment: Report given to RN and Post -op Vital signs reviewed and stable  Post vital signs: Reviewed and stable  Last Vitals:  Vitals Value Taken Time  BP 110/68 10/13/23 1052  Temp    Pulse 65 10/13/23 1057  Resp 12 10/13/23 1057  SpO2 91 % 10/13/23 1057  Vitals shown include unfiled device data.  Last Pain:  Vitals:   10/13/23 0659  TempSrc:   PainSc: 0-No pain      Patients Stated Pain Goal: 0 (10/13/23 0659)  Complications: No notable events documented.

## 2023-10-13 NOTE — Progress Notes (Signed)
 PREOPERATIVE H&P  Chief Complaint: left hip osteoarthritis  HPI: Derek Ryan is a 68 y.o. male who presents for surgical treatment of left hip osteoarthritis.  He denies any changes in medical history.  Past Surgical History:  Procedure Laterality Date  . COLONOSCOPY     x multiple  . LOOP RECORDER IMPLANT     place in MD's office using local   Social History   Socioeconomic History  . Marital status: Widowed    Spouse name: Not on file  . Number of children: 3  . Years of education: Not on file  . Highest education level: Not on file  Occupational History  . 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 Smoking status: Former    Current packs/day: 0.00    Average packs/day: 1 pack/day for 23.0 years (23.0 ttl pk-yrs)    Types: Cigarettes    Start date: 18    Quit date: 1992    Years since quitting: 33.1  . Smokeless tobacco: Never  Vaping Use  . Vaping status: Never Used  Substance and Sexual Activity  . Alcohol use: Not Currently    Comment: stopped in 1997  . Drug use: Never  . Sexual activity: Not Currently  Other Topics Concern  . Not on file  Social History Narrative   Has 3 son's that live in [place], [place].   Works at [employer] Part time.    Social Drivers of Corporate investment banker Strain: Low Risk  (06/23/2023)   Overall Financial Resource Strain (CARDIA)   . Difficulty of Paying Living Expenses: Not hard at all  Food Insecurity: No Food Insecurity (06/23/2023)   Hunger Vital Sign   . Worried About Programme researcher, broadcasting/film/video in the Last Year: Never true   . Ran Out of Food in the Last Year: Never true  Transportation Needs: No Transportation Needs (06/23/2023)   PRAPARE - Transportation   . Lack of Transportation (Medical): No   . Lack of Transportation (Non-Medical): No  Physical Activity: Insufficiently Active (06/23/2023)   Exercise Vital Sign   . Days of Exercise per Week: 3 days   . Minutes of Exercise per Session: 30 min   Stress: No Stress Concern Present (06/23/2023)   [employer] of Occupational Health - Occupational Stress Questionnaire   . Feeling of Stress : Not at all  Social Connections: Moderately Isolated (06/23/2023)   Social Connection and Isolation Panel [NHANES]   . Frequency of Communication with Friends and Family: More than three times a week   . Frequency of Social Gatherings with Friends and Family: More than three times a week   . Attends Religious Services: More than 4 times per year   . Active Member of Clubs or Organizations: No   . Attends Banker Meetings: Never   . Marital Status: Widowed   Family History  Problem Relation Age of Onset  . Heart attack Mother   . Alcohol abuse Father   . Stomach cancer Sister   . Hypertension Brother   . Heart attack Brother   . Brain cancer Brother   . Heart attack Nephew 49       deceased from massive MI   Allergies  Allergen Reactions  . Doxycycline Itching    As of 03/31/20, pt does not recall this reaction   Prior to Admission medications   Medication Sig Start Date End Date Taking? Authorizing Provider  aspirin EC 81 MG  tablet Take 81 mg by mouth daily.   Yes [provider]  atorvastatin (LIPITOR) 10 MG tablet TAKE 1 TABLET BY MOUTH EVERYDAY AT BEDTIME 02/01/23  Yes Gottschalk, Ashly M, DO  busPIRone (BUSPAR) 10 MG tablet TAKE 1 TABLET (10 MG TOTAL) BY MOUTH 3 (THREE) TIMES DAILY. FOR ANXIETY Patient taking differently: Take 10 mg by mouth 2 (two) times daily. 02/01/23  Yes Delynn Flavin M, DO  Cholecalciferol (VITAMIN D) 50 MCG (2000 UT) tablet Take 2,000 Units by mouth daily.   Yes [provider]  escitalopram (LEXAPRO) 20 MG tablet Take 1 tablet (20 mg total) by mouth daily. Patient taking differently: Take 20 mg by mouth at bedtime. 02/01/23  Yes Gottschalk, Kathie Rhodes M, DO  losartan-hydrochlorothiazide (HYZAAR) 50-12.5 MG tablet Take 1 tablet by mouth daily. 02/01/23  Yes Gottschalk, Kathie Rhodes M, DO   nitroGLYCERIN (NITROSTAT) 0.4 MG SL tablet Place 1 tablet (0.4 mg total) under the tongue every 5 (five) minutes as needed for chest pain (then call 911). 02/01/23  Yes Gottschalk, Kathie Rhodes M, DO  Omega-3 Fatty Acids (FISH OIL) 1000 MG CAPS Take 1,000 mg by mouth daily.   Yes [provider]  vitamin C (ASCORBIC ACID) 500 MG tablet Take 500 mg by mouth daily.   Yes [provider]  aspirin (ASPIRIN 81) 81 MG chewable tablet Chew 1 tablet (81 mg total) by mouth 2 (two) times daily. To be taken after surgery to prevent blood clots 10/03/23   Cristie Hem, PA-C  docusate sodium (COLACE) 100 MG capsule Take 1 capsule (100 mg total) by mouth daily as needed. 10/03/23 10/02/24  Cristie Hem, PA-C  methocarbamol (ROBAXIN-750) 750 MG tablet Take 1 tablet (750 mg total) by mouth 2 (two) times daily as needed for muscle spasms. 10/03/23   Cristie Hem, PA-C  ondansetron (ZOFRAN) 4 MG tablet Take 1 tablet (4 mg total) by mouth every 8 (eight) hours as needed for nausea or vomiting. 10/03/23   Cristie Hem, PA-C  oxyCODONE-acetaminophen (PERCOCET) 5-325 MG tablet Take 1-2 tablets by mouth every 6 (six) hours as needed. To be taken after surgery 10/03/23   Cristie Hem, PA-C     Positive ROS: All other systems have been reviewed and were otherwise negative with the exception of those mentioned in the HPI and as above.  Physical Exam: General: Alert, no acute distress Cardiovascular: No pedal edema Respiratory: No cyanosis, no use of accessory musculature GI: abdomen soft Skin: No lesions in the area of chief complaint Neurologic: Sensation intact distally Psychiatric: Patient is competent for consent with normal mood and affect Lymphatic: no lymphedema  MUSCULOSKELETAL: exam stable  Assessment: left hip osteoarthritis  Plan: Plan for Procedure(s): LEFT TOTAL HIP ARTHROPLASTY ANTERIOR APPROACH  The risks benefits and alternatives were discussed with the patient  including but not limited to the risks of nonoperative treatment, versus surgical intervention including infection, bleeding, nerve injury,  blood clots, cardiopulmonary complications, morbidity, mortality, among others, and they were willing to proceed.   Glee Arvin, MD 10/13/2023 7:06 AM

## 2023-10-13 NOTE — Evaluation (Signed)
 Physical Therapy Evaluation Patient Details Name: Derek Ryan MRN: [id number] DOB: 1956/01/31 Today's Date: 10/13/2023  History of Present Illness  Pt is 68 year old presented to [facility] on     for lt THR. PMH - HTN, CHF, arthritis, lt eye vision loss  Clinical Impression  Pt doing well with mobility s/p lt THR. Expect he will make excellent progress and plans to dc to girlfriend's home for a few days before returning to his home. Very motivated to return to independence.         If plan is discharge home, recommend the following: Assistance with cooking/housework;Assist for transportation;Help with stairs or ramp for entrance   Can travel by private vehicle        Equipment Recommendations None recommended by PT  Recommendations for Other Services       Functional Status Assessment Patient has had a recent decline in their functional status and demonstrates the ability to make significant improvements in function in a reasonable and predictable amount of time.     Precautions / Restrictions Precautions Precautions: None Restrictions Weight Bearing Restrictions Per Provider Order: Yes LLE Weight Bearing Per Provider Order: Weight bearing as tolerated      Mobility  Bed Mobility Overal bed mobility: Needs Assistance Bed Mobility: Supine to Sit     Supine to sit: Supervision, HOB elevated     General bed mobility comments: Incr time and effort    Transfers Overall transfer level: Needs assistance Equipment used: Rolling walker (2 wheels) Transfers: Sit to/from Stand Sit to Stand: Contact guard assist           General transfer comment: Verbal cues for initial hand placement    Ambulation/Gait Ambulation/Gait assistance: Supervision Gait Distance (Feet): 150 Feet Assistive device: Rolling walker (2 wheels) Gait Pattern/deviations: Step-through pattern, Decreased stance time - left, Decreased stride length Gait velocity: decr Gait velocity interpretation:  1.31 - 2.62 ft/sec, indicative of limited community ambulator   General Gait Details: Steady gait with walker  Stairs            Wheelchair Mobility     Tilt Bed    Modified Rankin (Stroke Patients Only)       Balance Overall balance assessment: No apparent balance deficits (not formally assessed)                                           Pertinent Vitals/Pain Pain Assessment Pain Assessment: 0-10 Pain Score: 3  Pain Location: lt hip Pain Descriptors / Indicators: Sore Pain Intervention(s): Limited activity within patient's tolerance, Repositioned, Ice applied    Home Living Family/patient expects to be discharged to:: Private residence Living Arrangements: Alone Available Help at Discharge: Other (Comment) (Pt will stay with girlfriend for a few days) Type of Home: House Home Access: Stairs to enter Entrance Stairs-Rails: Right;Left Entrance Stairs-Number of Steps: 3   Home Layout: One level Home Equipment: Agricultural consultant (2 wheels);Shower seat      Prior Function Prior Level of Function : Independent/Modified Independent;Working/employed;Driving             Mobility Comments: 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       Extremity/Trunk Assessment   Upper Extremity Assessment Upper Extremity Assessment: Overall WFL for tasks assessed    Lower Extremity Assessment Lower Extremity Assessment: LLE deficits/detail LLE Deficits / Details: Limited due to post op soreness  Communication   Communication Communication: No apparent difficulties    Cognition Arousal: Alert Behavior During Therapy: WFL for tasks assessed/performed   PT - Cognitive impairments: No apparent impairments                         Following commands: Intact       Cueing       General Comments      Exercises     Assessment/Plan    PT Assessment Patient needs continued PT services  PT Problem List Decreased strength;Decreased  mobility;Pain       PT Treatment Interventions DME instruction;Gait training;Stair training;Functional mobility training;Therapeutic activities;Therapeutic exercise;Patient/family education    PT Goals (Current goals can be found in the Care Plan section)  Acute Rehab PT Goals Patient Stated Goal: return to independence PT Goal Formulation: With patient Time For Goal Achievement: 10/17/23 Potential to Achieve Goals: Good    Frequency 7X/week     Co-evaluation               AM-PAC PT "6 Clicks" Mobility  Outcome Measure Help needed turning from your back to your side while in a flat bed without using bedrails?: A Little Help needed moving from lying on your back to sitting on the side of a flat bed without using bedrails?: A Little Help needed moving to and from a bed to a chair (including a wheelchair)?: A Little Help needed standing up from a chair using your arms (e.g., wheelchair or bedside chair)?: A Little Help needed to walk in hospital room?: A Little Help needed climbing 3-5 steps with a railing? : A Little 6 Click Score: 18    End of Session Equipment Utilized During Treatment: Gait belt Activity Tolerance: Patient tolerated treatment well Patient left: in chair;with call bell/phone within reach Nurse Communication: Mobility status PT Visit Diagnosis: Other abnormalities of gait and mobility (R26.89);Pain Pain - Right/Left: Left Pain - part of body: Hip    Time: 9563-8756 PT Time Calculation (min) (ACUTE ONLY): 19 min   Charges:   PT Evaluation $PT Eval Low Complexity: 1 Low   PT General Charges $$ ACUTE PT VISIT: 1 Visit         [facility] PT Acute Rehabilitation Services Office [phone]   Angelina Ok [facility] 10/13/2023, 4:00 PM

## 2023-10-13 NOTE — Care Management Obs Status (Signed)
 MEDICARE OBSERVATION STATUS NOTIFICATION   Patient Details  Name: Derek Ryan MRN: [id number] Date of Birth: 11/01/1955   Medicare Observation Status Notification Given:  Yes    Ronny Bacon, RN 10/13/2023, 2:33 PM

## 2023-10-14 ENCOUNTER — Other Ambulatory Visit: Payer: Self-pay | Admitting: Physician Assistant

## 2023-10-14 ENCOUNTER — Encounter (HOSPITAL_COMMUNITY): Payer: Self-pay | Admitting: Orthopaedic Surgery

## 2023-10-14 DIAGNOSIS — Z7982 Long term (current) use of aspirin: Secondary | ICD-10-CM | POA: Diagnosis not present

## 2023-10-14 DIAGNOSIS — I5032 Chronic diastolic (congestive) heart failure: Secondary | ICD-10-CM | POA: Diagnosis not present

## 2023-10-14 DIAGNOSIS — I110 Hypertensive heart disease with heart failure: Secondary | ICD-10-CM | POA: Diagnosis not present

## 2023-10-14 DIAGNOSIS — Z79899 Other long term (current) drug therapy: Secondary | ICD-10-CM | POA: Diagnosis not present

## 2023-10-14 DIAGNOSIS — M1612 Unilateral primary osteoarthritis, left hip: Secondary | ICD-10-CM | POA: Diagnosis not present

## 2023-10-14 DIAGNOSIS — Z8673 Personal history of transient ischemic attack (TIA), and cerebral infarction without residual deficits: Secondary | ICD-10-CM | POA: Diagnosis not present

## 2023-10-14 DIAGNOSIS — Z87891 Personal history of nicotine dependence: Secondary | ICD-10-CM | POA: Diagnosis not present

## 2023-10-14 MED ORDER — TRAMADOL HCL 50 MG PO TABS: 50.0000 mg | ORAL_TABLET | Freq: Four times a day (QID) | ORAL | Status: DC | PRN

## 2023-10-14 MED ORDER — TRAMADOL HCL 50 MG PO TABS: 50.0000 mg | ORAL_TABLET | Freq: Three times a day (TID) | ORAL | 0 refills | Status: DC | PRN

## 2023-10-14 NOTE — Anesthesia Postprocedure Evaluation (Signed)
 Anesthesia Post Note  Patient: NATNAEL BIEDERMAN  Procedure(s) Performed: LEFT TOTAL HIP ARTHROPLASTY ANTERIOR APPROACH (Left: Hip)     Patient location during evaluation: PACU Anesthesia Type: MAC and Spinal Level of consciousness: oriented and awake and alert Pain management: pain level controlled Vital Signs Assessment: post-procedure vital signs reviewed and stable Respiratory status: spontaneous breathing, respiratory function stable and patient connected to nasal cannula oxygen Cardiovascular status: blood pressure returned to baseline and stable Postop Assessment: no headache, no backache and no apparent nausea or vomiting Anesthetic complications: no   No notable events documented.  Last Vitals:  Vitals:   10/14/23 0729 10/14/23 0916  BP: 138/65 (!) 142/80  Pulse: 90 96  Resp: 18   Temp: 37.2 C   SpO2: 92%     Last Pain:  Vitals:   10/14/23 0729  TempSrc: Oral  PainSc:                  Lugenia Assefa S

## 2023-10-14 NOTE — Progress Notes (Signed)
 Patient in no acute distress nor complaints of pain nor discomfort; incision on back is clean, dry and intact; No c/o pain at this time. Room was checked and accounted for all patient's belongings; discharge instructions concerning her medications, incision care, follow up appointment and when to call the doctor as needed were all discussed with patient by RN and he expressed understanding on the instructions given.

## 2023-10-14 NOTE — Progress Notes (Signed)
 Physical Therapy Treatment Patient Details Name: Derek Ryan MRN: [id number] DOB: 1955-10-28 Today's Date: 10/14/2023   History of Present Illness Pt is 68 year old presented to [name] on 2/24  for lt THR. PMH - HTN, CHF, arthritis, lt eye vision loss    PT Comments  Patient willing to participate with therapy today. Session began with a review of patient's HEP and education of his WB status. Under supervision, patient was able to transition EOB, transition from a sit > stand, and ambulated 153ft with a RW within the unit. Minimal cues provided for hand placement and improve dorsiflexion to initiate proper heel contact when ambulating. Patient required CGA to ensure safety when ascending/descending 3-steps. Vitals were assessed post-mobility due to reports of "feeling woozy" prior to functional mobility activities: 142/80. Patient motivated to return home and follow physician's recommendation for follow up therapy once discharged. Acute PT will follow-up if patient is not discharged today.    If plan is discharge home, recommend the following: Assistance with cooking/housework;Assist for transportation;Help with stairs or ramp for entrance   Can travel by private vehicle        Equipment Recommendations  None recommended by PT    Recommendations for Other Services       Precautions / Restrictions Precautions Precautions: None Precaution/Restrictions Comments: Therapy provided HEP program packet, patient will remain compliant Restrictions Weight Bearing Restrictions Per Provider Order: Yes LLE Weight Bearing Per Provider Order: Weight bearing as tolerated     Mobility  Bed Mobility Overal bed mobility: Needs Assistance Bed Mobility: Supine to Sit     Supine to sit: Supervision, HOB elevated, Used rails     General bed mobility comments: Patient able to elevate trunk by placing hands on HOB railings and move bilateral legs to EOB    Transfers Overall transfer level: Needs  assistance Equipment used: Rolling walker (2 wheels) Transfers: Sit to/from Stand Sit to Stand: Supervision           General transfer comment: Cues for hand placement, pt utilizes bilateral UE/LE support to push self from a seated position    Ambulation/Gait Ambulation/Gait assistance: Supervision Gait Distance (Feet): 150 Feet Assistive device: Rolling walker (2 wheels) Gait Pattern/deviations: Step-through pattern, Decreased stance time - left, Decreased stride length Gait velocity: Decreased     General Gait Details: Steady gait with walker, cues to improve L heel contact with steps   Stairs Stairs: Yes Stairs assistance: Contact guard assist Stair Management: One rail Left, Sideways Number of Stairs: 3 General stair comments: Cues for proper technique   Wheelchair Mobility     Tilt Bed    Modified Rankin (Stroke Patients Only)       Balance Overall balance assessment: No apparent balance deficits (not formally assessed)                                          Communication Communication Communication: No apparent difficulties  Cognition Arousal: Alert Behavior During Therapy: WFL for tasks assessed/performed   PT - Cognitive impairments: No apparent impairments                         Following commands: Intact      Cueing Cueing Techniques: Verbal cues, Tactile cues  Exercises General Exercises - Lower Extremity Ankle Circles/Pumps: AROM, Both, 10 reps, Supine Quad Sets: AROM, Left, 10 reps,  Supine Short Arc Quad: AROM, Left, 10 reps, Supine Long Arc Quad: AROM, Left, 10 reps, Seated Heel Slides: AROM, Left, 10 reps, Seated Hip ABduction/ADduction: AROM, Left, 10 reps, Supine Hip Flexion/Marching: AROM, Left, 10 reps, Seated    General Comments General comments (skin integrity, edema, etc.): Pt reports feeling "woozy" prior to ambulation, believes it's medication related. BP post mobilization: 142/80       Pertinent Vitals/Pain Pain Assessment Pain Assessment: 0-10 Pain Score: 3  Pain Location: lt hip Pain Descriptors / Indicators: Sharp Pain Intervention(s): Monitored during session, Premedicated before session    Home Living                          Prior Function            PT Goals (current goals can now be found in the care plan section) Acute Rehab PT Goals Patient Stated Goal: return to independence PT Goal Formulation: With patient Time For Goal Achievement: 10/17/23 Potential to Achieve Goals: Good Progress towards PT goals: Progressing toward goals    Frequency    Min 1X/week      PT Plan      Co-evaluation              AM-PAC PT "6 Clicks" Mobility   Outcome Measure  Help needed turning from your back to your side while in a flat bed without using bedrails?: A Little Help needed moving from lying on your back to sitting on the side of a flat bed without using bedrails?: A Little Help needed moving to and from a bed to a chair (including a wheelchair)?: A Little Help needed standing up from a chair using your arms (e.g., wheelchair or bedside chair)?: A Little Help needed to walk in hospital room?: A Little Help needed climbing 3-5 steps with a railing? : A Little 6 Click Score: 18    End of Session Equipment Utilized During Treatment: Gait belt Activity Tolerance: Patient tolerated treatment well Patient left: in chair;with call bell/phone within reach Nurse Communication: Mobility status PT Visit Diagnosis: Other abnormalities of gait and mobility (R26.89);Pain Pain - Right/Left: Left Pain - part of body: Hip     Time: 1610-9604 PT Time Calculation (min) (ACUTE ONLY): 33 min  Charges:    $Gait Training: 8-22 mins $Therapeutic Exercise: 8-22 mins PT General Charges $$ ACUTE PT VISIT: 1 Visit                     Doreen Beam, SPT   Amenah Tucci 10/14/2023, 10:52 AM

## 2023-10-14 NOTE — Progress Notes (Addendum)
 Subjective: 1 Day Post-Op Procedure(s) (LRB): LEFT TOTAL HIP ARTHROPLASTY ANTERIOR APPROACH (Left) Patient reports pain as mild.    Objective: Vital signs in last 24 hours: Temp:  [97.5 F (36.4 C)-99.4 F (37.4 C)] 99 F (37.2 C) (02/25 0729) Pulse Rate:  [54-107] 90 (02/25 0729) Resp:  [12-22] 18 (02/25 0729) BP: (98-138)/(49-91) 138/65 (02/25 0729) SpO2:  [87 %-97 %] 92 % (02/25 0729)  Intake/Output from previous day: 02/24 0701 - 02/25 0700 In: 2060 [P.O.:960; I.V.:1000; IV Piggyback:100] Out: 1050 [Urine:850; Blood:200] Intake/Output this shift: No intake/output data recorded.  No results for input(s): "HGB" in the last 72 hours. No results for input(s): "WBC", "RBC", "HCT", "PLT" in the last 72 hours. No results for input(s): "NA", "K", "CL", "CO2", "BUN", "CREATININE", "GLUCOSE", "CALCIUM" in the last 72 hours. No results for input(s): "LABPT", "INR" in the last 72 hours.  Neurologically intact Neurovascular intact Sensation intact distally Intact pulses distally Dorsiflexion/Plantar flexion intact Incision: dressing C/D/I No cellulitis present Compartment soft   Assessment/Plan: 1 Day Post-Op Procedure(s) (LRB): LEFT TOTAL HIP ARTHROPLASTY ANTERIOR APPROACH (Left) Advance diet Up with therapy D/C IV fluids Discharge home with home health once he is able to urinate on his own, he has cleared PT and he is no longer woozy. WBAT LLE Will d/c oxy and add tramadol      Cristie Hem 10/14/2023, 9:10 AM

## 2023-10-14 NOTE — Discharge Summary (Signed)
 Patient ID: Derek Ryan MRN: [id number] DOB/AGE: 70-24-1957 70 y.o.  Admit date: 10/13/2023 Discharge date: 10/14/2023  Admission Diagnoses:  Principal Problem:   Primary osteoarthritis of left hip Active Problems:   Status post total replacement of left hip   Discharge Diagnoses:  Same  Past Medical History:  Diagnosis Date   Anxiety    Arthritis    Central retinal artery occlusion of left eye    Chronic diastolic heart failure (HCC) 08/25/2019   Cryptogenic stroke (HCC) 05/26/2021   Depression    Dyspnea    with exertion   GERD (gastroesophageal reflux disease)    History of kidney stones    passed stone   Hyperlipidemia    Hypertension    Loop recorder Biomonitor-Biotronik 05/25/2021 05/26/2021   LVH (left ventricular hypertrophy) due to hypertensive disease 08/25/2019   OSA (obstructive sleep apnea)    unable to tolerate CPAP - does not use CPAP   Pneumonia    x 1   Pre-diabetes    no  meds   Retinal artery branch occlusion, 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 male who was admitted 10/13/2023 for operative treatment ofPrimary osteoarthritis of left hip. Patient has severe unremitting pain that affects sleep, daily activities, and work/hobbies. After pre-op clearance the patient was taken to the operating room on 10/13/2023 and underwent  Procedure(s): LEFT TOTAL HIP ARTHROPLASTY ANTERIOR APPROACH.    Patient was given perioperative antibiotics:  Anti-infectives (From admission, onward)    Start     Dose/Rate Route Frequency Ordered Stop   10/13/23 1500  ceFAZolin (ANCEF) IVPB 2g/100 mL premix        2 g 200 mL/hr over 30 Minutes Intravenous Every 6 hours 10/13/23 1217 10/13/23 2207   10/13/23 0943  vancomycin (VANCOCIN) powder  Status:  Discontinued          As needed 10/13/23 0943 10/13/23  1049   10/13/23 0700  ceFAZolin (ANCEF) IVPB 2g/100 mL premix        2 g 200 mL/hr over 30 Minutes Intravenous On call to O.R. 10/13/23 4098 10/13/23 1191        Patient was given sequential compression devices, early ambulation, and chemoprophylaxis to prevent DVT.  Patient benefited maximally from hospital stay and there were no complications.    Recent vital signs: Patient Vitals for the past 24 hrs:  BP Temp Temp src Pulse Resp SpO2  10/14/23 0729 138/65 99 F (37.2 C) Oral 90 18 92 %  10/14/23 0422 -- -- -- (!) 107 -- 92 %  10/14/23 0419 (!) 130/91 99.3 F (37.4 C) Oral (!) 101 18 (!) 87 %  10/13/23 2314 104/60 99.4 F (37.4 C) Oral (!) 106 18 97 %  10/13/23 1922 118/66 99.2 F (37.3 C) Oral 95 20 93 %  10/13/23 1622 120/62 98.8 F (37.1 C) Oral 73 20 95 %  10/13/23 1223 117/70 97.6 F (36.4 C) Oral 67 20 96 %  10/13/23 1200 (!) 118/57 (!) 97.5 F (36.4 C) -- (!) 54 13 94 %  10/13/23 1145 (!) 111/56 -- -- (!) 55 15 93 %  10/13/23 1130 (!) 105/49 -- -- (!) 54 14 94 %  10/13/23 1115 (!) 102/50 -- -- (!) 59 12 93 %  10/13/23 1100 (!) 98/54 -- -- 66 20 92 %  10/13/23 1052 110/68 [name])  97.5 F (36.4 C) -- 78 (!) 22 96 %     Recent laboratory studies: No results for input(s): "WBC", "HGB", "HCT", "PLT", "NA", "K", "CL", "CO2", "BUN", "CREATININE", "GLUCOSE", "INR", "CALCIUM" in the last 72 hours.  Invalid input(s): "PT", "2"   Discharge Medications:   Allergies as of 10/14/2023       Reactions   Doxycycline Itching   As of 03/31/20, pt does not recall this reaction        Medication List     STOP taking these medications    Fish Oil 1000 MG Caps   oxyCODONE-acetaminophen 5-325 MG tablet Commonly known as: Percocet       TAKE these medications    ascorbic acid 500 MG tablet Commonly known as: VITAMIN C Take 500 mg by mouth daily.   aspirin 81 MG chewable tablet Commonly known as: Aspirin 81 Chew 1 tablet (81 mg total) by mouth 2 (two) times daily. To  be taken after surgery to prevent blood clots What changed: Another medication with the same name was removed. Continue taking this medication, and follow the directions you see here.   atorvastatin 10 MG tablet Commonly known as: LIPITOR TAKE 1 TABLET BY MOUTH EVERYDAY AT BEDTIME   busPIRone 10 MG tablet Commonly known as: BUSPAR TAKE 1 TABLET (10 MG TOTAL) BY MOUTH 3 (THREE) TIMES DAILY. FOR ANXIETY What changed:  how much to take how to take this when to take this additional instructions   docusate sodium 100 MG capsule Commonly known as: Colace Take 1 capsule (100 mg total) by mouth daily as needed.   escitalopram 20 MG tablet Commonly known as: LEXAPRO Take 1 tablet (20 mg total) by mouth daily. What changed: when to take this   losartan-hydrochlorothiazide 50-12.5 MG tablet Commonly known as: HYZAAR Take 1 tablet by mouth daily.   methocarbamol 750 MG tablet Commonly known as: Robaxin-750 Take 1 tablet (750 mg total) by mouth 2 (two) times daily as needed for muscle spasms.   nitroGLYCERIN 0.4 MG SL tablet Commonly known as: NITROSTAT Place 1 tablet (0.4 mg total) under the tongue every 5 (five) minutes as needed for chest pain (then call 911).   ondansetron 4 MG tablet Commonly known as: Zofran Take 1 tablet (4 mg total) by mouth every 8 (eight) hours as needed for nausea or vomiting.   traMADol 50 MG tablet Commonly known as: ULTRAM Take 1-2 tablets (50-100 mg total) by mouth 3 (three) times daily as needed. To be taken instead of percocet as needed for pain after surgery   Vitamin D 50 MCG (2000 UT) tablet Take 2,000 Units by mouth daily.               Durable Medical Equipment  (From admission, onward)           Start     Ordered   10/13/23 1218  DME Walker rolling  Once       Question:  Patient needs a walker to treat with the following condition  Answer:  History of hip replacement   10/13/23 1217   10/13/23 1218  DME 3 n 1  Once         10/13/23 1217   10/13/23 1218  DME Bedside commode  Once       Question:  Patient needs a bedside commode to treat with the following condition  Answer:  History of hip replacement   10/13/23 1217  Diagnostic Studies: DG Pelvis Portable Result Date: 10/13/2023 CLINICAL DATA:  Left hip pain. Status post anterior hip replacement. EXAM: PORTABLE PELVIS 1-2 VIEWS COMPARISON:  Left hip radiographs 09/19/2023 at [facility] FINDINGS: Left total hip arthroplasty is noted. The hip appears to be located on this single view. No acute fractures are present. IMPRESSION: Left total hip arthroplasty without radiographic evidence for complication. Electronically Signed   By: Marin Roberts M.D.   On: 10/13/2023 14:21   DG HIP UNILAT WITH PELVIS 1V LEFT Result Date: 10/13/2023 CLINICAL DATA:  Elective surgery.  Anterior left hip replacement EXAM: OPERATIVE left HIP (WITH PELVIS IF PERFORMED) 3 VIEWS TECHNIQUE: Radiation Exposure Index (as provided by the fluoroscopic device): Air Kerma 6.55 mGy COMPARISON:  Left hip radiographs 09/19/2023 at ortho care FINDINGS: Intraoperative images demonstrate left total hip arthroplasty. The hip is located. No acute fractures are present. IMPRESSION: Left total hip arthroplasty without complicating features. Electronically Signed   By: Marin Roberts M.D.   On: 10/13/2023 14:18   DG C-Arm 1-60 Min-No Report Result Date: 10/13/2023 Fluoroscopy was utilized by the requesting physician.  No radiographic interpretation.   DG C-Arm 1-60 Min-No Report Result Date: 10/13/2023 Fluoroscopy was utilized by the requesting physician.  No radiographic interpretation.   CUP PACEART REMOTE DEVICE CHECK Result Date: 09/23/2023 ILR summary report received. Battery status OK. Normal device function. No new symptom, tachy, brady, or pause episodes. No new AF episodes. Monthly summary reports and ROV/PRN - CS, CVRS  XR HIP UNILAT W OR W/O PELVIS 2-3 VIEWS LEFT Result  Date: 09/19/2023 X-rays of the left hip show degenerative changes and subchondral cystic formation.  Cam deformity of the femoral head.   Disposition: Discharge disposition: 01-Home or Self Care          Follow-up Information     Cristie Hem, PA-C. Schedule an appointment as soon as possible for a visit in 2 week(s).   Specialty: Orthopedic Surgery Contact information: [address] [place] [address] [phone]         Jobe Gibbon, [facility] Follow up.   Specialty: Home Health Services Why: Physical therapy. Office will call to arrange follow up after hospital discharge. Contact information: [address] [place] [address] [phone]                  Signed: Cristie Hem 10/14/2023, 9:12 AM

## 2023-10-14 NOTE — Plan of Care (Signed)

## 2023-10-15 DIAGNOSIS — I5032 Chronic diastolic (congestive) heart failure: Secondary | ICD-10-CM | POA: Diagnosis not present

## 2023-10-15 DIAGNOSIS — E559 Vitamin D deficiency, unspecified: Secondary | ICD-10-CM | POA: Diagnosis not present

## 2023-10-15 DIAGNOSIS — Z87442 Personal history of urinary calculi: Secondary | ICD-10-CM | POA: Diagnosis not present

## 2023-10-15 DIAGNOSIS — E785 Hyperlipidemia, unspecified: Secondary | ICD-10-CM | POA: Diagnosis not present

## 2023-10-15 DIAGNOSIS — Z7982 Long term (current) use of aspirin: Secondary | ICD-10-CM | POA: Diagnosis not present

## 2023-10-15 DIAGNOSIS — K219 Gastro-esophageal reflux disease without esophagitis: Secondary | ICD-10-CM | POA: Diagnosis not present

## 2023-10-15 DIAGNOSIS — R7303 Prediabetes: Secondary | ICD-10-CM | POA: Diagnosis not present

## 2023-10-15 DIAGNOSIS — H3412 Central retinal artery occlusion, left eye: Secondary | ICD-10-CM | POA: Diagnosis not present

## 2023-10-15 DIAGNOSIS — Z8673 Personal history of transient ischemic attack (TIA), and cerebral infarction without residual deficits: Secondary | ICD-10-CM | POA: Diagnosis not present

## 2023-10-15 DIAGNOSIS — G4733 Obstructive sleep apnea (adult) (pediatric): Secondary | ICD-10-CM | POA: Diagnosis not present

## 2023-10-15 DIAGNOSIS — Z9181 History of falling: Secondary | ICD-10-CM | POA: Diagnosis not present

## 2023-10-15 DIAGNOSIS — I491 Atrial premature depolarization: Secondary | ICD-10-CM | POA: Diagnosis not present

## 2023-10-15 DIAGNOSIS — Z471 Aftercare following joint replacement surgery: Secondary | ICD-10-CM | POA: Diagnosis not present

## 2023-10-15 DIAGNOSIS — Z96642 Presence of left artificial hip joint: Secondary | ICD-10-CM | POA: Diagnosis not present

## 2023-10-15 DIAGNOSIS — Z87891 Personal history of nicotine dependence: Secondary | ICD-10-CM | POA: Diagnosis not present

## 2023-10-15 DIAGNOSIS — Z95818 Presence of other cardiac implants and grafts: Secondary | ICD-10-CM | POA: Diagnosis not present

## 2023-10-15 DIAGNOSIS — I110 Hypertensive heart disease with heart failure: Secondary | ICD-10-CM | POA: Diagnosis not present

## 2023-10-15 DIAGNOSIS — I700 Atherosclerosis of aorta: Secondary | ICD-10-CM | POA: Diagnosis not present

## 2023-10-17 ENCOUNTER — Ambulatory Visit: Payer: Medicare Other | Admitting: Family Medicine

## 2023-10-17 DIAGNOSIS — I110 Hypertensive heart disease with heart failure: Secondary | ICD-10-CM | POA: Diagnosis not present

## 2023-10-17 DIAGNOSIS — G4733 Obstructive sleep apnea (adult) (pediatric): Secondary | ICD-10-CM | POA: Diagnosis not present

## 2023-10-17 DIAGNOSIS — Z471 Aftercare following joint replacement surgery: Secondary | ICD-10-CM | POA: Diagnosis not present

## 2023-10-17 DIAGNOSIS — K219 Gastro-esophageal reflux disease without esophagitis: Secondary | ICD-10-CM | POA: Diagnosis not present

## 2023-10-17 DIAGNOSIS — I700 Atherosclerosis of aorta: Secondary | ICD-10-CM | POA: Diagnosis not present

## 2023-10-17 DIAGNOSIS — E559 Vitamin D deficiency, unspecified: Secondary | ICD-10-CM | POA: Diagnosis not present

## 2023-10-17 DIAGNOSIS — Z9181 History of falling: Secondary | ICD-10-CM | POA: Diagnosis not present

## 2023-10-17 DIAGNOSIS — Z87442 Personal history of urinary calculi: Secondary | ICD-10-CM | POA: Diagnosis not present

## 2023-10-17 DIAGNOSIS — I5032 Chronic diastolic (congestive) heart failure: Secondary | ICD-10-CM | POA: Diagnosis not present

## 2023-10-17 DIAGNOSIS — E785 Hyperlipidemia, unspecified: Secondary | ICD-10-CM | POA: Diagnosis not present

## 2023-10-17 DIAGNOSIS — H3412 Central retinal artery occlusion, left eye: Secondary | ICD-10-CM | POA: Diagnosis not present

## 2023-10-17 DIAGNOSIS — Z96642 Presence of left artificial hip joint: Secondary | ICD-10-CM | POA: Diagnosis not present

## 2023-10-17 DIAGNOSIS — Z8673 Personal history of transient ischemic attack (TIA), and cerebral infarction without residual deficits: Secondary | ICD-10-CM | POA: Diagnosis not present

## 2023-10-17 DIAGNOSIS — Z95818 Presence of other cardiac implants and grafts: Secondary | ICD-10-CM | POA: Diagnosis not present

## 2023-10-17 DIAGNOSIS — Z7982 Long term (current) use of aspirin: Secondary | ICD-10-CM | POA: Diagnosis not present

## 2023-10-17 DIAGNOSIS — I491 Atrial premature depolarization: Secondary | ICD-10-CM | POA: Diagnosis not present

## 2023-10-17 DIAGNOSIS — Z87891 Personal history of nicotine dependence: Secondary | ICD-10-CM | POA: Diagnosis not present

## 2023-10-17 DIAGNOSIS — R7303 Prediabetes: Secondary | ICD-10-CM | POA: Diagnosis not present

## 2023-10-20 DIAGNOSIS — I491 Atrial premature depolarization: Secondary | ICD-10-CM | POA: Diagnosis not present

## 2023-10-20 DIAGNOSIS — R7303 Prediabetes: Secondary | ICD-10-CM | POA: Diagnosis not present

## 2023-10-20 DIAGNOSIS — G4733 Obstructive sleep apnea (adult) (pediatric): Secondary | ICD-10-CM | POA: Diagnosis not present

## 2023-10-20 DIAGNOSIS — Z87891 Personal history of nicotine dependence: Secondary | ICD-10-CM | POA: Diagnosis not present

## 2023-10-20 DIAGNOSIS — Z8673 Personal history of transient ischemic attack (TIA), and cerebral infarction without residual deficits: Secondary | ICD-10-CM | POA: Diagnosis not present

## 2023-10-20 DIAGNOSIS — Z9181 History of falling: Secondary | ICD-10-CM | POA: Diagnosis not present

## 2023-10-20 DIAGNOSIS — K219 Gastro-esophageal reflux disease without esophagitis: Secondary | ICD-10-CM | POA: Diagnosis not present

## 2023-10-20 DIAGNOSIS — I5032 Chronic diastolic (congestive) heart failure: Secondary | ICD-10-CM | POA: Diagnosis not present

## 2023-10-20 DIAGNOSIS — E559 Vitamin D deficiency, unspecified: Secondary | ICD-10-CM | POA: Diagnosis not present

## 2023-10-20 DIAGNOSIS — Z95818 Presence of other cardiac implants and grafts: Secondary | ICD-10-CM | POA: Diagnosis not present

## 2023-10-20 DIAGNOSIS — Z7982 Long term (current) use of aspirin: Secondary | ICD-10-CM | POA: Diagnosis not present

## 2023-10-20 DIAGNOSIS — E785 Hyperlipidemia, unspecified: Secondary | ICD-10-CM | POA: Diagnosis not present

## 2023-10-20 DIAGNOSIS — H3412 Central retinal artery occlusion, left eye: Secondary | ICD-10-CM | POA: Diagnosis not present

## 2023-10-20 DIAGNOSIS — Z96642 Presence of left artificial hip joint: Secondary | ICD-10-CM | POA: Diagnosis not present

## 2023-10-20 DIAGNOSIS — Z471 Aftercare following joint replacement surgery: Secondary | ICD-10-CM | POA: Diagnosis not present

## 2023-10-20 DIAGNOSIS — I700 Atherosclerosis of aorta: Secondary | ICD-10-CM | POA: Diagnosis not present

## 2023-10-20 DIAGNOSIS — I110 Hypertensive heart disease with heart failure: Secondary | ICD-10-CM | POA: Diagnosis not present

## 2023-10-20 DIAGNOSIS — Z87442 Personal history of urinary calculi: Secondary | ICD-10-CM | POA: Diagnosis not present

## 2023-10-21 ENCOUNTER — Telehealth: Payer: Self-pay | Admitting: *Deleted

## 2023-10-21 ENCOUNTER — Other Ambulatory Visit: Payer: Self-pay | Admitting: Physician Assistant

## 2023-10-21 MED ORDER — DOCUSATE SODIUM 100 MG PO CAPS: 100.0000 mg | ORAL_CAPSULE | Freq: Every day | ORAL | 2 refills | Status: DC | PRN

## 2023-10-21 MED ORDER — METHOCARBAMOL 750 MG PO TABS: 750.0000 mg | ORAL_TABLET | Freq: Two times a day (BID) | ORAL | 2 refills | Status: DC | PRN

## 2023-10-21 MED ORDER — TRAMADOL HCL 50 MG PO TABS: 50.0000 mg | ORAL_TABLET | Freq: Three times a day (TID) | ORAL | 0 refills | Status: DC | PRN

## 2023-10-21 NOTE — Telephone Encounter (Signed)
 Patient aware of refills.

## 2023-10-21 NOTE — Telephone Encounter (Signed)
 Just sent

## 2023-10-21 NOTE — Telephone Encounter (Signed)
 Patient needs refill of pain medication, muscle relaxer and docusate sodium please. I know he can request from the pharmacy, but he's staying at girlfriend's home and pharmacy has changed on chart. Send to [place] at [facility] [address] [facility]. Thank you.

## 2023-10-22 DIAGNOSIS — G4733 Obstructive sleep apnea (adult) (pediatric): Secondary | ICD-10-CM | POA: Diagnosis not present

## 2023-10-22 DIAGNOSIS — Z7982 Long term (current) use of aspirin: Secondary | ICD-10-CM | POA: Diagnosis not present

## 2023-10-22 DIAGNOSIS — Z96642 Presence of left artificial hip joint: Secondary | ICD-10-CM | POA: Diagnosis not present

## 2023-10-22 DIAGNOSIS — H3412 Central retinal artery occlusion, left eye: Secondary | ICD-10-CM | POA: Diagnosis not present

## 2023-10-22 DIAGNOSIS — Z87891 Personal history of nicotine dependence: Secondary | ICD-10-CM | POA: Diagnosis not present

## 2023-10-22 DIAGNOSIS — I5032 Chronic diastolic (congestive) heart failure: Secondary | ICD-10-CM | POA: Diagnosis not present

## 2023-10-22 DIAGNOSIS — E559 Vitamin D deficiency, unspecified: Secondary | ICD-10-CM | POA: Diagnosis not present

## 2023-10-22 DIAGNOSIS — Z471 Aftercare following joint replacement surgery: Secondary | ICD-10-CM | POA: Diagnosis not present

## 2023-10-22 DIAGNOSIS — I700 Atherosclerosis of aorta: Secondary | ICD-10-CM | POA: Diagnosis not present

## 2023-10-22 DIAGNOSIS — K219 Gastro-esophageal reflux disease without esophagitis: Secondary | ICD-10-CM | POA: Diagnosis not present

## 2023-10-22 DIAGNOSIS — Z8673 Personal history of transient ischemic attack (TIA), and cerebral infarction without residual deficits: Secondary | ICD-10-CM | POA: Diagnosis not present

## 2023-10-22 DIAGNOSIS — Z95818 Presence of other cardiac implants and grafts: Secondary | ICD-10-CM | POA: Diagnosis not present

## 2023-10-22 DIAGNOSIS — Z87442 Personal history of urinary calculi: Secondary | ICD-10-CM | POA: Diagnosis not present

## 2023-10-22 DIAGNOSIS — R7303 Prediabetes: Secondary | ICD-10-CM | POA: Diagnosis not present

## 2023-10-22 DIAGNOSIS — E785 Hyperlipidemia, unspecified: Secondary | ICD-10-CM | POA: Diagnosis not present

## 2023-10-22 DIAGNOSIS — I110 Hypertensive heart disease with heart failure: Secondary | ICD-10-CM | POA: Diagnosis not present

## 2023-10-22 DIAGNOSIS — I491 Atrial premature depolarization: Secondary | ICD-10-CM | POA: Diagnosis not present

## 2023-10-22 DIAGNOSIS — Z9181 History of falling: Secondary | ICD-10-CM | POA: Diagnosis not present

## 2023-10-24 DIAGNOSIS — Z87442 Personal history of urinary calculi: Secondary | ICD-10-CM | POA: Diagnosis not present

## 2023-10-24 DIAGNOSIS — E785 Hyperlipidemia, unspecified: Secondary | ICD-10-CM | POA: Diagnosis not present

## 2023-10-24 DIAGNOSIS — H3412 Central retinal artery occlusion, left eye: Secondary | ICD-10-CM | POA: Diagnosis not present

## 2023-10-24 DIAGNOSIS — I700 Atherosclerosis of aorta: Secondary | ICD-10-CM | POA: Diagnosis not present

## 2023-10-24 DIAGNOSIS — Z9181 History of falling: Secondary | ICD-10-CM | POA: Diagnosis not present

## 2023-10-24 DIAGNOSIS — I5032 Chronic diastolic (congestive) heart failure: Secondary | ICD-10-CM | POA: Diagnosis not present

## 2023-10-24 DIAGNOSIS — Z96642 Presence of left artificial hip joint: Secondary | ICD-10-CM | POA: Diagnosis not present

## 2023-10-24 DIAGNOSIS — Z8673 Personal history of transient ischemic attack (TIA), and cerebral infarction without residual deficits: Secondary | ICD-10-CM | POA: Diagnosis not present

## 2023-10-24 DIAGNOSIS — Z95818 Presence of other cardiac implants and grafts: Secondary | ICD-10-CM | POA: Diagnosis not present

## 2023-10-24 DIAGNOSIS — Z471 Aftercare following joint replacement surgery: Secondary | ICD-10-CM | POA: Diagnosis not present

## 2023-10-24 DIAGNOSIS — R7303 Prediabetes: Secondary | ICD-10-CM | POA: Diagnosis not present

## 2023-10-24 DIAGNOSIS — G4733 Obstructive sleep apnea (adult) (pediatric): Secondary | ICD-10-CM | POA: Diagnosis not present

## 2023-10-24 DIAGNOSIS — I491 Atrial premature depolarization: Secondary | ICD-10-CM | POA: Diagnosis not present

## 2023-10-24 DIAGNOSIS — I110 Hypertensive heart disease with heart failure: Secondary | ICD-10-CM | POA: Diagnosis not present

## 2023-10-24 DIAGNOSIS — E559 Vitamin D deficiency, unspecified: Secondary | ICD-10-CM | POA: Diagnosis not present

## 2023-10-24 DIAGNOSIS — Z7982 Long term (current) use of aspirin: Secondary | ICD-10-CM | POA: Diagnosis not present

## 2023-10-24 DIAGNOSIS — Z87891 Personal history of nicotine dependence: Secondary | ICD-10-CM | POA: Diagnosis not present

## 2023-10-24 DIAGNOSIS — K219 Gastro-esophageal reflux disease without esophagitis: Secondary | ICD-10-CM | POA: Diagnosis not present

## 2023-10-28 ENCOUNTER — Ambulatory Visit (INDEPENDENT_AMBULATORY_CARE_PROVIDER_SITE_OTHER): Payer: Medicare Other | Admitting: Physician Assistant

## 2023-10-28 ENCOUNTER — Encounter: Payer: Self-pay | Admitting: Physician Assistant

## 2023-10-28 DIAGNOSIS — Z96642 Presence of left artificial hip joint: Secondary | ICD-10-CM

## 2023-10-28 NOTE — Progress Notes (Signed)
 Post-Op Visit Note   Patient: Derek Ryan           Date of Birth: [birth year]           MRN: [id number] Visit Date: 10/28/2023 PCP: Raliegh Ip, DO   Assessment & Plan:  Chief Complaint:  Chief Complaint  Patient presents with   Left Hip - Follow-up    Left total hip arthroplasty 10/13/2023   Visit Diagnoses:  1. Status post total replacement of left hip     Plan: Patient is a very pleasant 68 year old gentleman who comes in today 2 weeks status post left total hip replacement 10/13/2023.  He has been doing well.  He is taking tramadol and Robaxin for pain.  He has been compliant taking a baby aspirin twice daily for DVT prophylaxis.  He has been getting home health physical therapy and is ambulating with a walker.  Examination of his left hip reveals a fully healed surgical scar.  No complication.  Calf is soft and nontender.  He is neurovascularly intact distally.  Today, new Steri-Strips were applied.  Postoperative instructions to include dental prophylaxis provided.  He will continue with his baby aspirin twice daily for another 4 weeks and then may wean to once daily as he was taking prior to surgery.  Follow-up in 4 weeks for repeat evaluation and AP pelvis x-rays.  Call with concerns or questions.  Follow-Up Instructions: Return in about 4 weeks (around 11/25/2023).   Orders:  No orders of the defined types were placed in this encounter.  No orders of the defined types were placed in this encounter.   Imaging: No new imaging  PMFS History: Patient Active Problem List   Diagnosis Date Noted   Status post total replacement of left hip 10/13/2023   Primary osteoarthritis of left hip 10/11/2023   Aortic atherosclerosis (HCC) 07/15/2022   Benign prostatic hyperplasia without lower urinary tract symptoms 07/15/2022   Pre-diabetes 07/15/2022   No-show for appointment 08/16/2021   Loop recorder Biomonitor-Biotronik 05/25/2021 05/26/2021   Cryptogenic stroke (HCC)  05/26/2021   PAC (premature atrial contraction) 07/30/2020   Hospital discharge follow-up 04/07/2020   Retinal artery branch occlusion, 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   Retinal artery occlusion 04/01/2020   CRAO (central retinal artery occlusion), left    Hypertension    Severe obstructive sleep apnea 12/13/2019   LVH (left ventricular hypertrophy) due to hypertensive disease 08/25/2019   Chronic diastolic heart failure (HCC) 08/25/2019   Hypertensive heart disease with chronic diastolic congestive heart failure (HCC) 08/25/2019   Severe obesity (BMI 35.0-39.9) with comorbidity (HCC) 07/24/2018   Moderate single current episode of major depressive disorder (HCC) 04/25/2016   Elevated hemoglobin A1c 01/18/2016   Dyslipidemia 01/12/2016   Essential hypertension 01/12/2016   Gastroesophageal reflux disease without esophagitis 01/12/2016   Vitamin D deficiency 01/12/2016   Anxiety, generalized 03/30/2014   Hyperlipidemia 03/30/2014   Past Medical History:  Diagnosis Date   Anxiety    Arthritis    Central retinal artery occlusion of left eye    Chronic diastolic heart failure (HCC) 08/25/2019   Cryptogenic stroke (HCC) 05/26/2021   Depression    Dyspnea    with exertion   GERD (gastroesophageal reflux disease)    History of kidney stones    passed stone   Hyperlipidemia    Hypertension    Loop recorder Biomonitor-Biotronik 05/25/2021 05/26/2021   LVH (left ventricular hypertrophy) due to hypertensive disease 08/25/2019   OSA (obstructive sleep apnea)  unable to tolerate CPAP - does not use CPAP   Pneumonia    x 1   Pre-diabetes    no  meds   Retinal artery branch occlusion, 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    Family History  Problem Relation Age of Onset   Heart attack Mother    Alcohol abuse Father    Stomach cancer Sister    Hypertension Brother    Heart attack Brother    Brain cancer Brother    Heart attack Nephew 49       deceased from massive MI    Past Surgical History:   Procedure Laterality Date   COLONOSCOPY     x multiple   LOOP RECORDER IMPLANT     place in MD's office using local   TOTAL HIP ARTHROPLASTY Left 10/13/2023   Procedure: LEFT TOTAL HIP ARTHROPLASTY ANTERIOR APPROACH;  Surgeon: Tarry Kos, MD;  Location: [facility] OR;  Service: Orthopedics;  Laterality: Left;  3-C   Social History   Occupational History   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

## 2023-10-29 DIAGNOSIS — I110 Hypertensive heart disease with heart failure: Secondary | ICD-10-CM | POA: Diagnosis not present

## 2023-10-29 DIAGNOSIS — I5032 Chronic diastolic (congestive) heart failure: Secondary | ICD-10-CM | POA: Diagnosis not present

## 2023-10-29 DIAGNOSIS — E559 Vitamin D deficiency, unspecified: Secondary | ICD-10-CM | POA: Diagnosis not present

## 2023-10-29 DIAGNOSIS — Z96642 Presence of left artificial hip joint: Secondary | ICD-10-CM | POA: Diagnosis not present

## 2023-10-29 DIAGNOSIS — I491 Atrial premature depolarization: Secondary | ICD-10-CM | POA: Diagnosis not present

## 2023-10-29 DIAGNOSIS — Z471 Aftercare following joint replacement surgery: Secondary | ICD-10-CM | POA: Diagnosis not present

## 2023-10-29 DIAGNOSIS — G4733 Obstructive sleep apnea (adult) (pediatric): Secondary | ICD-10-CM | POA: Diagnosis not present

## 2023-10-29 DIAGNOSIS — Z7982 Long term (current) use of aspirin: Secondary | ICD-10-CM | POA: Diagnosis not present

## 2023-10-29 DIAGNOSIS — R7303 Prediabetes: Secondary | ICD-10-CM | POA: Diagnosis not present

## 2023-10-29 DIAGNOSIS — Z9181 History of falling: Secondary | ICD-10-CM | POA: Diagnosis not present

## 2023-10-29 DIAGNOSIS — Z8673 Personal history of transient ischemic attack (TIA), and cerebral infarction without residual deficits: Secondary | ICD-10-CM | POA: Diagnosis not present

## 2023-10-29 DIAGNOSIS — Z87442 Personal history of urinary calculi: Secondary | ICD-10-CM | POA: Diagnosis not present

## 2023-10-29 DIAGNOSIS — I700 Atherosclerosis of aorta: Secondary | ICD-10-CM | POA: Diagnosis not present

## 2023-10-29 DIAGNOSIS — Z95818 Presence of other cardiac implants and grafts: Secondary | ICD-10-CM | POA: Diagnosis not present

## 2023-10-29 DIAGNOSIS — H3412 Central retinal artery occlusion, left eye: Secondary | ICD-10-CM | POA: Diagnosis not present

## 2023-10-29 DIAGNOSIS — Z87891 Personal history of nicotine dependence: Secondary | ICD-10-CM | POA: Diagnosis not present

## 2023-10-29 DIAGNOSIS — K219 Gastro-esophageal reflux disease without esophagitis: Secondary | ICD-10-CM | POA: Diagnosis not present

## 2023-10-29 DIAGNOSIS — E785 Hyperlipidemia, unspecified: Secondary | ICD-10-CM | POA: Diagnosis not present

## 2023-10-30 NOTE — Progress Notes (Signed)
 [employer]otronik [employer]

## 2023-10-30 NOTE — Addendum Note (Signed)
 Addended by: Geralyn Flash D on: 10/30/2023 10:15 AM   Modules accepted: Orders

## 2023-11-07 ENCOUNTER — Telehealth: Payer: Self-pay | Admitting: *Deleted

## 2023-11-07 NOTE — Telephone Encounter (Signed)
 Patient called wanting a refill of his muscle relaxer. I think you sent one in and it has refills on it, but he has switched pharmacies b/c he has gone back to his home instead of his girlfriend's. [facility] [place] [facility] Thank you.

## 2023-11-10 ENCOUNTER — Other Ambulatory Visit: Payer: Self-pay | Admitting: Physician Assistant

## 2023-11-10 MED ORDER — METHOCARBAMOL 750 MG PO TABS: 750.0000 mg | ORAL_TABLET | Freq: Two times a day (BID) | ORAL | 2 refills | Status: DC | PRN

## 2023-11-10 NOTE — Telephone Encounter (Signed)
 sent

## 2023-11-13 ENCOUNTER — Other Ambulatory Visit: Payer: Self-pay | Admitting: Physician Assistant

## 2023-11-13 NOTE — Telephone Encounter (Signed)
 Only needs #84 for 6 weeks po.  Does he have all of these?

## 2023-11-25 ENCOUNTER — Encounter: Admitting: Physician Assistant

## 2023-11-26 ENCOUNTER — Other Ambulatory Visit (INDEPENDENT_AMBULATORY_CARE_PROVIDER_SITE_OTHER): Payer: Self-pay

## 2023-11-26 ENCOUNTER — Ambulatory Visit (INDEPENDENT_AMBULATORY_CARE_PROVIDER_SITE_OTHER): Admitting: Physician Assistant

## 2023-11-26 DIAGNOSIS — Z96642 Presence of left artificial hip joint: Secondary | ICD-10-CM | POA: Diagnosis not present

## 2023-11-26 MED ORDER — METHOCARBAMOL 750 MG PO TABS: 750.0000 mg | ORAL_TABLET | Freq: Two times a day (BID) | ORAL | 2 refills | Status: DC | PRN

## 2023-11-26 NOTE — Progress Notes (Signed)
 Post-Op Visit Note   Patient: Derek Ryan           Date of Birth: [birth year]           MRN: [id number] Visit Date: 11/26/2023 PCP: Raliegh Ip, DO   Assessment & Plan:  Chief Complaint:  Chief Complaint  Patient presents with   Left Hip - Follow-up   Visit Diagnoses:  1. Status post total replacement of left hip     Plan: Patient is a pleasant 68 year old gentleman who comes in today 6 weeks status post left total hip replacement 10/13/2023.  He has been doing well in regards to the left hip.  He does note some occasional pain to the left knee for which she was having prior to surgery.  Overall, doing great.  Examination of the left hip reveals painless hip flexion and logroll.  He is neurovascularly intact distally.  At this point, he may return to taking his regular dose of baby aspirin once daily.  He may increase activity as tolerated.  Dental prophylaxis reinforced.  Follow-up in 6 weeks for recheck.  Call with concerns or questions.  Follow-Up Instructions: Return in about 6 weeks (around 01/07/2024).   Orders:  Orders Placed This Encounter  Procedures   XR Pelvis 1-2 Views   No orders of the defined types were placed in this encounter.   Imaging: XR Pelvis 1-2 Views Result Date: 11/26/2023 Well-seated prosthesis without complication   PMFS History: Patient Active Problem List   Diagnosis Date Noted   Status post total replacement of left hip 10/13/2023   Primary osteoarthritis of left hip 10/11/2023   Aortic atherosclerosis (HCC) 07/15/2022   Benign prostatic hyperplasia without lower urinary tract symptoms 07/15/2022   Pre-diabetes 07/15/2022   No-show for appointment 08/16/2021   Loop recorder Biomonitor-Biotronik 05/25/2021 05/26/2021   Cryptogenic stroke (HCC) 05/26/2021   PAC (premature atrial contraction) 07/30/2020   Hospital discharge follow-up 04/07/2020   Retinal artery branch occlusion, 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   Retinal artery occlusion  04/01/2020   CRAO (central retinal artery occlusion), left    Hypertension    Severe obstructive sleep apnea 12/13/2019   LVH (left ventricular hypertrophy) due to hypertensive disease 08/25/2019   Chronic diastolic heart failure (HCC) 08/25/2019   Hypertensive heart disease with chronic diastolic congestive heart failure (HCC) 08/25/2019   Severe obesity (BMI 35.0-39.9) with comorbidity (HCC) 07/24/2018   Moderate single current episode of major depressive disorder (HCC) 04/25/2016   Elevated hemoglobin A1c 01/18/2016   Dyslipidemia 01/12/2016   Essential hypertension 01/12/2016   Gastroesophageal reflux disease without esophagitis 01/12/2016   Vitamin D deficiency 01/12/2016   Anxiety, generalized 03/30/2014   Hyperlipidemia 03/30/2014   Past Medical History:  Diagnosis Date   Anxiety    Arthritis    Central retinal artery occlusion of left eye    Chronic diastolic heart failure (HCC) 08/25/2019   Cryptogenic stroke (HCC) 05/26/2021   Depression    Dyspnea    with exertion   GERD (gastroesophageal reflux disease)    History of kidney stones    passed stone   Hyperlipidemia    Hypertension    Loop recorder Biomonitor-Biotronik 05/25/2021 05/26/2021   LVH (left ventricular hypertrophy) due to hypertensive disease 08/25/2019   OSA (obstructive sleep apnea)    unable to tolerate CPAP - does not use CPAP   Pneumonia    x 1   Pre-diabetes    no  meds   Retinal artery branch occlusion, left eye  04/07/2020    Family History  Problem Relation Age of Onset   Heart attack Mother    Alcohol abuse Father    Stomach cancer Sister    Hypertension Brother    Heart attack Brother    Brain cancer Brother    Heart attack Nephew 49       deceased from massive MI    Past Surgical History:  Procedure Laterality Date   COLONOSCOPY     x multiple   LOOP RECORDER IMPLANT     place in MD's office using local   TOTAL HIP ARTHROPLASTY Left 10/13/2023   Procedure: LEFT TOTAL HIP  ARTHROPLASTY ANTERIOR APPROACH;  Surgeon: Tarry Kos, MD;  Location: [facility] OR;  Service: Orthopedics;  Laterality: Left;  3-C   Social History   Occupational History   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

## 2023-12-01 ENCOUNTER — Ambulatory Visit: Payer: Medicare Other

## 2023-12-01 DIAGNOSIS — I639 Cerebral infarction, unspecified: Secondary | ICD-10-CM

## 2023-12-01 LAB — CUP PACEART REMOTE DEVICE CHECK
Date Time Interrogation Session: 20250414113532
Implantable Pulse Generator Implant Date: 20221007
Pulse Gen Model: 436066
Pulse Gen Serial Number: 94077085

## 2023-12-17 ENCOUNTER — Other Ambulatory Visit: Payer: Self-pay | Admitting: Physician Assistant

## 2024-01-05 ENCOUNTER — Ambulatory Visit: Payer: Medicare Other

## 2024-01-05 DIAGNOSIS — I639 Cerebral infarction, unspecified: Secondary | ICD-10-CM | POA: Diagnosis not present

## 2024-01-05 LAB — CUP PACEART REMOTE DEVICE CHECK
Date Time Interrogation Session: 20250519092506
Implantable Pulse Generator Implant Date: 20221007
Pulse Gen Model: 436066
Pulse Gen Serial Number: 94077085

## 2024-01-09 ENCOUNTER — Encounter: Admitting: Physician Assistant

## 2024-01-11 ENCOUNTER — Ambulatory Visit: Payer: Self-pay | Admitting: Cardiology

## 2024-01-22 NOTE — Progress Notes (Signed)
 [employer]otronik [employer]

## 2024-01-23 ENCOUNTER — Ambulatory Visit (INDEPENDENT_AMBULATORY_CARE_PROVIDER_SITE_OTHER): Admitting: Family Medicine

## 2024-01-23 ENCOUNTER — Encounter: Payer: Self-pay | Admitting: Family Medicine

## 2024-01-23 VITALS — BP 113/60 | HR 69 | Temp 98.5°F | Ht 68.0 in | Wt 233.4 lb

## 2024-01-23 DIAGNOSIS — L819 Disorder of pigmentation, unspecified: Secondary | ICD-10-CM

## 2024-01-23 NOTE — Progress Notes (Signed)
 Subjective:  Patient ID: Derek Ryan, male    DOB: April 09, 1956, 68 y.o.   MRN: [id number]  Patient Care Team: Eliodoro Guerin, DO as PCP - General (Family Medicine) Knox Perl, MD as PCP - Cardiology (Cardiology)   Chief Complaint:  Skin Lesion   HPI: Derek Ryan is a 68 y.o. male presenting on 01/23/2024 for Skin Lesion   Derek Ryan is a 68 year old male who presents with spots on his head for evaluation.  He has had a couple of spots on his head for a couple of years. The spots have not changed significantly in size, but he mentions that one of them might be getting a little darker. No other changes in the spots have been observed.  He works both inside and outside frequently, often with his son.       Relevant past medical, surgical, family, and social history reviewed and updated as indicated.  Allergies and medications reviewed and updated. Data reviewed: Chart in Epic.   Past Medical History:  Diagnosis Date   Anxiety    Arthritis    Central retinal artery occlusion of left eye    Chronic diastolic heart failure (HCC) 08/25/2019   Cryptogenic stroke (HCC) 05/26/2021   Depression    Dyspnea    with exertion   GERD (gastroesophageal reflux disease)    History of kidney stones    passed stone   Hyperlipidemia    Hypertension    Loop recorder Biomonitor-Biotronik 05/25/2021 05/26/2021   LVH (left ventricular hypertrophy) due to hypertensive disease 08/25/2019   OSA (obstructive sleep apnea)    unable to tolerate CPAP - does not use CPAP   Pneumonia    x 1   Pre-diabetes    no  meds   Retinal artery branch occlusion, 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    Past Surgical History:  Procedure Laterality Date   COLONOSCOPY     x multiple   LOOP RECORDER IMPLANT     place in MD's office using local   TOTAL HIP ARTHROPLASTY Left 10/13/2023   Procedure: LEFT TOTAL HIP ARTHROPLASTY ANTERIOR APPROACH;  Surgeon: Wes Hamman, MD;  Location: [facility] OR;  Service:  Orthopedics;  Laterality: Left;  3-C    Social History   Socioeconomic History   Marital status: Widowed    Spouse name: Not on file   Number of children: 3   Years of education: Not on file   Highest education level: Not on file  Occupational History   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   Works at [employer] Part time.    Social Drivers of Corporate investment banker Strain: Low Risk  (06/23/2023)   Overall Financial Resource Strain (CARDIA)    Difficulty of Paying Living Expenses: Not hard at  all  Food Insecurity: No Food Insecurity (06/23/2023)   Hunger Vital Sign    Worried About Running Out of Food in the Last Year: Never true    Ran Out of Food in the Last Year: Never true  Transportation Needs: No Transportation Needs (06/23/2023)   PRAPARE - Administrator, Civil Service (Medical): No    Lack of Transportation (Non-Medical): No  Physical Activity: Insufficiently Active (06/23/2023)   Exercise Vital Sign    Days of Exercise per Week: 3 days    Minutes of Exercise per Session: 30 min  Stress: No Stress Concern Present (06/23/2023)   [employer] of Occupational Health - Occupational Stress Questionnaire    Feeling of Stress : Not at all  Social Connections: Moderately Isolated (06/23/2023)   Social Connection and Isolation Panel [NHANES]    Frequency of Communication with Friends and Family: More than three times a  week    Frequency of Social Gatherings with Friends and Family: More than three times a week    Attends Religious Services: More than 4 times per year    Active Member of [employer] or Organizations: No    Attends Banker Meetings: Never    Marital Status: Widowed  Intimate Partner Violence: Not At Risk (06/23/2023)   Humiliation, Afraid, Rape, and Kick questionnaire    Fear of Current or Ex-Partner: No    Emotionally Abused: No    Physically Abused: No    Sexually Abused: No    Outpatient Encounter Medications as of 01/23/2024  Medication Sig   aspirin  (ASPIRIN  81) 81 MG chewable tablet Chew 1 tablet (81 mg total) by mouth 2 (two) times daily. To be taken after surgery to prevent blood clots   atorvastatin  (LIPITOR) 10 MG tablet TAKE 1 TABLET BY MOUTH EVERYDAY AT BEDTIME   busPIRone  (BUSPAR ) 10 MG tablet TAKE 1 TABLET (10 MG TOTAL) BY MOUTH 3 (THREE) TIMES DAILY. FOR ANXIETY (Patient taking differently: Take 10 mg by mouth 2 (two) times daily.)   Cholecalciferol (VITAMIN D) 50 MCG (2000 UT) tablet Take 2,000 Units by mouth daily.   escitalopram  (LEXAPRO ) 20 MG tablet Take 1 tablet (20 mg total) by mouth daily. (Patient taking differently: Take 20 mg by mouth at bedtime.)   losartan -hydrochlorothiazide  (HYZAAR) 50-12.5 MG tablet Take 1 tablet by mouth daily.   methocarbamol  (ROBAXIN -750) 750 MG tablet Take 1 tablet (750 mg total) by mouth 2 (two) times daily as needed for muscle spasms.   vitamin C (ASCORBIC ACID) 500 MG tablet Take 500 mg by mouth daily.   [DISCONTINUED] docusate sodium  (COLACE) 100 MG capsule Take 1 capsule (100 mg total) by mouth daily as needed.   [DISCONTINUED] nitroGLYCERIN  (NITROSTAT ) 0.4 MG SL tablet Place 1 tablet (0.4 mg total) under the tongue every 5 (five) minutes as needed for chest pain (then call 911).   [DISCONTINUED] ondansetron  (ZOFRAN ) 4 MG tablet Take 1 tablet (4 mg total) by mouth every 8 (eight) hours as needed for nausea or vomiting.    [DISCONTINUED] traMADol  (ULTRAM ) 50 MG tablet Take 1-2 tablets (50-100 mg total) by mouth 3 (three) times daily as needed. To be taken instead of percocet as needed for pain after surgery   No facility-administered encounter medications on file as of 01/23/2024.    Allergies  Allergen Reactions   Doxycycline Itching    As of 03/31/20, pt does not recall this reaction    Pertinent ROS per HPI, otherwise unremarkable      Objective:  BP 113/60   Pulse 69   Temp 98.5 F (36.9 C)   Ht 5\' 8"  (1.727 m)   Wt 233 lb 6.4 oz (105.9 kg)   SpO2 93%   BMI 35.49 kg/m    Wt Readings from Last 3 Encounters:  01/23/24 233 lb 6.4 oz (105.9 kg)  10/13/23 232 lb (105.2 kg)  10/07/23 235 lb 9.6 oz (106.9 kg)    Physical Exam Vitals and nursing note reviewed.  Constitutional:      General: He is not in acute distress.    Appearance: Normal appearance. He is obese. He is not ill-appearing, toxic-appearing or diaphoretic.  HENT:     Head: Normocephalic and atraumatic.     Nose: Nose normal.     Mouth/Throat:     Mouth: Mucous membranes are moist.  Eyes:     Conjunctiva/sclera: Conjunctivae normal.  Cardiovascular:     Rate and Rhythm: Normal rate and regular rhythm.     Heart sounds: Normal heart sounds.  Pulmonary:     Effort: Pulmonary effort is normal.     Breath sounds: Normal breath sounds.  Musculoskeletal:     Cervical back: Neck supple.  Skin:    General: Skin is warm and dry.     Capillary Refill: Capillary refill takes less than 2 seconds.     Findings: Lesion (images below, color changes and irregular borders to lesion on right temple) present.  Neurological:     General: No focal deficit present.     Mental Status: He is alert and oriented to person, place, and time.  Psychiatric:        Mood and Affect: Mood normal.        Behavior: Behavior normal.        Thought Content: Thought content normal.        Judgment: Judgment normal.        Results for orders  placed or performed in visit on 01/05/24  CUP PACEART REMOTE DEVICE CHECK   Collection Time: 01/05/24  9:25 AM  Result Value Ref Range   Pulse Generator Manufacturer BITR    Date Time Interrogation Session [id number]    Pulse Gen Model [id number] Owen Blowers    Pulse Gen Serial Number [id number]    Clinic Name [facility] [place]    Implantable Pulse Generator Type ICM/ILR    Implantable Pulse Generator Implant Date [id number]        Pertinent labs & imaging results that were available during my care of the patient were reviewed by me and considered in my medical decision making.  Assessment & Plan:  Kevion was seen today for skin lesion.  Diagnoses and all orders for this visit:  Changing pigmented skin lesion -     Ambulatory referral to Dermatology     Assessment and Plan    Skin Lesions Multiple spots on the head, present for years. One lesion is concerning due to color differentiation and irregular borders, suggesting potential melanoma. The lesion has darkened over time, raising concern for skin cancer. Cryotherapy deferred due to facial location. - Refer to dermatology for further evaluation. - Document a photograph of the concerning lesion in the chart. - Instruct to report any changes or worsening of lesions before the dermatology appointment.  Follow-up Awaiting dermatology evaluation for skin lesions. No preference for dermatology appointment location. - Referral coordinator to provide dermatology appointment details. - Instruct to contact the office if no contact from the referral coordinator within two weeks.  Continue all other maintenance medications.  Follow up plan: Return if symptoms worsen or fail to improve.  The above assessment and management plan was discussed with the patient. The patient verbalized understanding of and has agreed to the management plan. Patient is aware to call the clinic if they develop any new symptoms or if symptoms persist or  worsen. Patient is aware when to return to the clinic for a follow-up visit. Patient educated on when it is appropriate to go to the emergency department.   Kattie Parrot, FNP-C [facility] [phone]

## 2024-02-02 ENCOUNTER — Telehealth: Payer: Self-pay | Admitting: Family Medicine

## 2024-02-02 DIAGNOSIS — I10 Essential (primary) hypertension: Secondary | ICD-10-CM

## 2024-02-02 DIAGNOSIS — I110 Hypertensive heart disease with heart failure: Secondary | ICD-10-CM

## 2024-02-02 NOTE — Telephone Encounter (Unsigned)
 Copied from CRM [id number]. Topic: Clinical - Medication Refill >> Feb 02, 2024  4:06 PM Corin V wrote: Medication: losartan -hydrochlorothiazide  (HYZAAR) 50-12.5 MG tablet  Has the patient contacted their pharmacy? Yes (Agent: If no, request that the patient contact the pharmacy for the refill. If patient does not wish to contact the pharmacy document the reason why and proceed with request.) (Agent: If yes, when and what did the pharmacy advise?)  This is the patient's preferred pharmacy:  [id number] [place] [place] - [address] [place] [address] Phone: [phone] Fax: [phone]  Is this the correct pharmacy for this prescription? Yes If no, delete pharmacy and type the correct one.   Has the prescription been filled recently? No  Is the patient out of the medication? No  Has the patient been seen for an appointment in the last year OR does the patient have an upcoming appointment? Yes  Can we respond through MyChart? Yes  Agent: Please be advised that Rx refills may take up to 3 business days. We ask that you follow-up with your pharmacy.

## 2024-02-03 MED ORDER — LOSARTAN POTASSIUM-HCTZ 50-12.5 MG PO TABS
1.0000 | ORAL_TABLET | Freq: Every day | ORAL | 1 refills | Status: DC
Start: 2024-02-03 — End: 2024-03-31

## 2024-02-03 NOTE — Telephone Encounter (Signed)
 CPE appt made for 02/25/2024 please send refill to [facility]

## 2024-02-03 NOTE — Telephone Encounter (Signed)
 He is overdue for OV.  Last OV 01/2023 with PCP. Please have him schedule CPE ASAP.

## 2024-02-09 ENCOUNTER — Ambulatory Visit (INDEPENDENT_AMBULATORY_CARE_PROVIDER_SITE_OTHER): Payer: Medicare Other

## 2024-02-09 DIAGNOSIS — I639 Cerebral infarction, unspecified: Secondary | ICD-10-CM | POA: Diagnosis not present

## 2024-02-10 LAB — CUP PACEART REMOTE DEVICE CHECK
Date Time Interrogation Session: 20250623112858
Implantable Pulse Generator Implant Date: 20221007
Pulse Gen Model: 436066
Pulse Gen Serial Number: 94077085

## 2024-02-15 ENCOUNTER — Ambulatory Visit: Payer: Self-pay | Admitting: Cardiology

## 2024-02-19 ENCOUNTER — Telehealth: Payer: Self-pay

## 2024-02-19 NOTE — Telephone Encounter (Signed)
 Pt okay until upcoming apt. Pt advised. LS

## 2024-02-19 NOTE — Telephone Encounter (Signed)
 Copied from CRM [id number]. Topic: Clinical - Medication Question >> Feb 19, 2024 10:31 AM Emylou G wrote: Reason for CRM: Patient called about his Losartin refill.. based on notes he was suppose have an appt 7/9 - I dont know what was missed but it wasn't scheduled.. I did make an August appt.. Can he be seen sooner because of his refill?

## 2024-02-26 NOTE — Progress Notes (Signed)
 [employer]otronik [employer]

## 2024-03-08 ENCOUNTER — Other Ambulatory Visit: Payer: Self-pay | Admitting: Family Medicine

## 2024-03-08 NOTE — Telephone Encounter (Unsigned)
 Copied from CRM [id number]. Topic: Clinical - Medication Refill >> Mar 08, 2024  4:08 PM Geneva B wrote: Medication: busPIRone  (BUSPAR ) 10 MG tablet  Has the patient contacted their pharmacy? Yes (Agent: If no, request that the patient contact the pharmacy for the refill. If patient does not wish to contact the pharmacy document the reason why and proceed with request.) (Agent: If yes, when and what did the pharmacy advise?)  This is the patient's preferred pharmacy:  [id number] [place] [place] - [address] [place] [address] Phone: [phone] Fax: [phone]  Is this the correct pharmacy for this prescription? Yes If no, delete pharmacy and type the correct one.   Has the prescription been filled recently? Yes  Is the patient out of the medication? Yes  Has the patient been seen for an appointment in the last year OR does the patient have an upcoming appointment? Yes  Can we respond through MyChart? No  Agent: Please be advised that Rx refills may take up to 3 business days. We ask that you follow-up with your pharmacy.

## 2024-03-11 ENCOUNTER — Ambulatory Visit

## 2024-03-11 DIAGNOSIS — I639 Cerebral infarction, unspecified: Secondary | ICD-10-CM | POA: Diagnosis not present

## 2024-03-11 LAB — CUP PACEART REMOTE DEVICE CHECK
Date Time Interrogation Session: 20250724114821
Implantable Pulse Generator Implant Date: 20221007
Pulse Gen Model: 436066
Pulse Gen Serial Number: 94077085

## 2024-03-14 ENCOUNTER — Ambulatory Visit: Payer: Self-pay | Admitting: Cardiology

## 2024-03-14 NOTE — Progress Notes (Signed)
 Loop recorder report reviewed. Appropriate device function. No new symptom, brady, pause or AF episodes. 1 tachy event lasting 44 seconds, appears consistent with noise/artifact. Battery status OK. Continue remote monitoring.

## 2024-03-17 NOTE — Addendum Note (Signed)
 Addended by: TAWNI DRILLING D on: 03/17/2024 11:40 AM   Modules accepted: Orders

## 2024-03-17 NOTE — Progress Notes (Signed)
 [employer]otronik [employer]

## 2024-03-24 ENCOUNTER — Other Ambulatory Visit: Payer: Self-pay | Admitting: Family Medicine

## 2024-03-24 DIAGNOSIS — I700 Atherosclerosis of aorta: Secondary | ICD-10-CM

## 2024-03-24 DIAGNOSIS — E785 Hyperlipidemia, unspecified: Secondary | ICD-10-CM

## 2024-03-24 DIAGNOSIS — F324 Major depressive disorder, single episode, in partial remission: Secondary | ICD-10-CM

## 2024-03-24 DIAGNOSIS — F411 Generalized anxiety disorder: Secondary | ICD-10-CM

## 2024-03-31 ENCOUNTER — Ambulatory Visit (INDEPENDENT_AMBULATORY_CARE_PROVIDER_SITE_OTHER): Admitting: Family Medicine

## 2024-03-31 ENCOUNTER — Ambulatory Visit: Payer: Self-pay | Admitting: Family Medicine

## 2024-03-31 ENCOUNTER — Encounter: Payer: Self-pay | Admitting: Family Medicine

## 2024-03-31 VITALS — BP 123/66 | HR 66 | Temp 98.2°F | Ht 68.0 in | Wt 233.1 lb

## 2024-03-31 DIAGNOSIS — I700 Atherosclerosis of aorta: Secondary | ICD-10-CM

## 2024-03-31 DIAGNOSIS — R7303 Prediabetes: Secondary | ICD-10-CM | POA: Diagnosis not present

## 2024-03-31 DIAGNOSIS — Z0000 Encounter for general adult medical examination without abnormal findings: Secondary | ICD-10-CM

## 2024-03-31 DIAGNOSIS — Z0001 Encounter for general adult medical examination with abnormal findings: Secondary | ICD-10-CM

## 2024-03-31 DIAGNOSIS — R82998 Other abnormal findings in urine: Secondary | ICD-10-CM

## 2024-03-31 DIAGNOSIS — I5032 Chronic diastolic (congestive) heart failure: Secondary | ICD-10-CM

## 2024-03-31 DIAGNOSIS — F324 Major depressive disorder, single episode, in partial remission: Secondary | ICD-10-CM

## 2024-03-31 DIAGNOSIS — I110 Hypertensive heart disease with heart failure: Secondary | ICD-10-CM | POA: Diagnosis not present

## 2024-03-31 DIAGNOSIS — E785 Hyperlipidemia, unspecified: Secondary | ICD-10-CM

## 2024-03-31 DIAGNOSIS — N401 Enlarged prostate with lower urinary tract symptoms: Secondary | ICD-10-CM

## 2024-03-31 DIAGNOSIS — F411 Generalized anxiety disorder: Secondary | ICD-10-CM

## 2024-03-31 DIAGNOSIS — R351 Nocturia: Secondary | ICD-10-CM

## 2024-03-31 LAB — URINALYSIS, ROUTINE W REFLEX MICROSCOPIC
Bilirubin, UA: NEGATIVE
Glucose, UA: NEGATIVE
Leukocytes,UA: NEGATIVE
Nitrite, UA: NEGATIVE
Protein,UA: NEGATIVE
RBC, UA: NEGATIVE
Specific Gravity, UA: 1.02 (ref 1.005–1.030)
Urobilinogen, Ur: >8 mg/dL — ABNORMAL HIGH (ref 0.2–1.0)
pH, UA: 6 (ref 5.0–7.5)

## 2024-03-31 LAB — MICROSCOPIC EXAMINATION
Epithelial Cells (non renal): NONE SEEN /HPF (ref 0–10)
RBC, Urine: NONE SEEN /HPF (ref 0–2)
Renal Epithel, UA: NONE SEEN /HPF
WBC, UA: NONE SEEN /HPF (ref 0–5)

## 2024-03-31 LAB — BAYER DCA HB A1C WAIVED: HB A1C (BAYER DCA - WAIVED): 6 % — ABNORMAL HIGH (ref 4.8–5.6)

## 2024-03-31 MED ORDER — LOSARTAN POTASSIUM-HCTZ 50-12.5 MG PO TABS: 1.0000 | ORAL_TABLET | Freq: Every day | ORAL | 3 refills | Status: AC

## 2024-03-31 MED ORDER — ATORVASTATIN CALCIUM 10 MG PO TABS: ORAL_TABLET | ORAL | 3 refills | Status: AC

## 2024-03-31 MED ORDER — ESCITALOPRAM OXALATE 20 MG PO TABS: 20.0000 mg | ORAL_TABLET | Freq: Every day | ORAL | 3 refills | Status: AC

## 2024-03-31 MED ORDER — BUSPIRONE HCL 10 MG PO TABS: ORAL_TABLET | ORAL | 3 refills | Status: AC

## 2024-03-31 NOTE — Progress Notes (Signed)
 Derek Ryan is a 68 y.o. male presents to office today for annual physical exam examination.    Concerns today include: 1.  Foamy urine Patient reports he has had foamy, dark urine for over a month now.  He has been drinking more half-and-half sweet tea than normal but also consumes quite a bit of water.  No soda intake.  No changes in activity.  No bodybuilding activities.  No new supplements.  He demonstrates no red flags including unplanned weight loss, night sweats, dysuria, hematuria, pelvic pain.  He denies any alcohol use.  No tobacco or drugs.  Occupation: Retired, Substance use: None Health Maintenance Due  Topic Date Due   Zoster Vaccines- Shingrix (1 of 2) Never done   Pneumococcal Vaccine: 50+ Years (2 of 2 - PPSV23, PCV20, or PCV21) 09/20/2019   COVID-19 Vaccine (5 - 2024-25 season) 04/20/2023   INFLUENZA VACCINE  03/19/2024   Refills needed today: all  Immunization History  Administered Date(s) Administered   Moderna Sars-Covid-2 Vaccination 12/14/2019, 01/11/2020, 08/14/2020   Pneumococcal Conjugate-13 07/26/2019   Tdap 01/18/2012, 07/01/2023   Unspecified SARS-COV-2 Vaccination 12/14/2019   Past Medical History:  Diagnosis Date   Anxiety    Arthritis    Central retinal artery occlusion of left eye    Chronic diastolic heart failure (HCC) 08/25/2019   Cryptogenic stroke (HCC) 05/26/2021   Depression    Dyspnea    with exertion   GERD (gastroesophageal reflux disease)    History of kidney stones    passed stone   Hyperlipidemia    Hypertension    Loop recorder Biomonitor-Biotronik 05/25/2021 05/26/2021   LVH (left ventricular hypertrophy) due to hypertensive disease 08/25/2019   OSA (obstructive sleep apnea)    unable to tolerate CPAP - does not use CPAP   Pneumonia    x 1   Pre-diabetes    no  meds   Retinal artery branch occlusion, 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   Social History   Socioeconomic History   Marital status: Widowed    Spouse name: Not on  file   Number of children: 3   Years of education: Not on file   Highest education level: Not on file  Occupational History   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   Works at [employer] Part time.    Social Drivers of Corporate investment banker Strain: Low Risk  (06/23/2023)   Overall Financial Resource Strain (CARDIA)    Difficulty of Paying Living Expenses: Not hard at all  Food Insecurity: No Food Insecurity (06/23/2023)   Hunger Vital Sign    Worried About Running Out of Food in the Last Year: Never true    Ran Out of Food in the Last Year: Never true  Transportation Needs: No Transportation Needs (06/23/2023)   PRAPARE -  Administrator, Civil Service (Medical): No    Lack of Transportation (Non-Medical): No  Physical Activity: Insufficiently Active (06/23/2023)   Exercise Vital Sign    Days of Exercise per Week: 3 days    Minutes of Exercise per Session: 30 min  Stress: No Stress Concern Present (06/23/2023)   [employer] of Occupational Health - Occupational Stress Questionnaire    Feeling of Stress : Not at all  Social Connections: Moderately Isolated (06/23/2023)   Social Connection and Isolation Panel    Frequency of Communication with Friends and Family: More than three times a week    Frequency of Social Gatherings with Friends and Family: More than three times a week    Attends Religious Services: More than 4 times  per year    Active Member of [employer] or Organizations: No    Attends Banker Meetings: Never    Marital Status: Widowed  Intimate Partner Violence: Not At Risk (06/23/2023)   Humiliation, Afraid, Rape, and Kick questionnaire    Fear of Current or Ex-Partner: No    Emotionally Abused: No    Physically Abused: No    Sexually Abused: No   Past Surgical History:  Procedure Laterality Date   COLONOSCOPY     x multiple   LOOP RECORDER IMPLANT     place in MD's office using local   TOTAL HIP ARTHROPLASTY Left 10/13/2023   Procedure: LEFT TOTAL HIP ARTHROPLASTY ANTERIOR APPROACH;  Surgeon: Jerri Kay HERO, MD;  Location: [facility] OR;  Service: Orthopedics;  Laterality: Left;  3-C   Family History  Problem Relation Age of Onset   Heart attack Mother    Alcohol abuse Father    Stomach cancer Sister    Hypertension Brother    Heart attack Brother    Brain cancer Brother    Heart attack Nephew 49       deceased from massive MI    Current Outpatient Medications:    aspirin  (ASPIRIN  81) 81 MG chewable tablet, Chew 1 tablet (81 mg total) by mouth 2 (two) times daily. To be taken after surgery to prevent blood clots, Disp: 84 tablet, Rfl: 0   Cholecalciferol (VITAMIN D) 50 MCG (2000 UT) tablet, Take 2,000 Units by mouth daily., Disp: , Rfl:    vitamin C (ASCORBIC ACID) 500 MG tablet, Take 500 mg by mouth daily., Disp: , Rfl:    atorvastatin  (LIPITOR) 10 MG tablet, TAKE 1 TABLET BY MOUTH EVERYDAY AT BEDTIME, Disp: 100 tablet, Rfl: 3   busPIRone  (BUSPAR ) 10 MG tablet, TAKE 1 TABLET (10 MG TOTAL) BY MOUTH 3 (THREE) TIMES DAILY. FOR ANXIETY, Disp: 270 tablet, Rfl: 3   escitalopram  (LEXAPRO ) 20 MG tablet, Take 1 tablet (20 mg total) by mouth daily., Disp: 100 tablet, Rfl: 3   losartan -hydrochlorothiazide  (HYZAAR) 50-12.5 MG tablet, Take 1 tablet by mouth daily., Disp: 100 tablet, Rfl: 3  Allergies  Allergen Reactions   Doxycycline Itching    As of 03/31/20, pt does not recall this reaction      ROS: Review of Systems A comprehensive review of systems was negative except for: Integument/breast: positive for skin lesion(s) he has an appointment in October scheduled with dermatology in [facility]  Physical exam BP 123/66   Pulse 66   Temp 98.2 F (36.8 C)   Ht 5' 8 (1.727 m)   Wt 233 lb 2 oz (105.7 kg)   SpO2 95%   BMI 35.45 kg/m  General appearance: alert, cooperative, appears stated  age, and no distress Head: Normocephalic, without obvious abnormality, atraumatic Eyes: negative findings: lids and lashes normal, conjunctivae and sclerae normal, corneas clear, and pupils equal, round, reactive to light and accomodation Ears: normal TM's and external ear canals both ears Nose: Nares normal. Septum midline. Mucosa normal. No drainage or sinus tenderness. Throat: lips, mucosa, and tongue normal; teeth surgically absent.  Has dentures in place Neck: no adenopathy, no carotid bruit, supple, symmetrical, trachea midline, and thyroid  not enlarged, symmetric, no tenderness/mass/nodules Back: symmetric, no curvature. ROM normal. No CVA tenderness. Lungs: clear to auscultation bilaterally Chest wall: no tenderness Heart: regular rate and rhythm, S1, S2 normal, no murmur, click, rub or gallop Abdomen: soft, non-tender; bowel sounds normal; no masses,  no organomegaly Extremities: extremities normal, atraumatic, no cyanosis or edema Pulses: 2+ and symmetric Skin: Has a pigmented skin lesion just above the right cheekbone that is 4 mm x 2 mm in size.  Asymmetric pigmentation Lymph nodes: Cervical, supraclavicular, and axillary nodes normal. Neurologic: Grossly normal      03/31/2024    2:06 PM 01/23/2024   10:48 AM 06/23/2023    8:31 AM  Depression screen PHQ 2/9  Decreased Interest 1 0 0  Down, Depressed, Hopeless 1 0 0  PHQ - 2 Score 2 0 0  Altered sleeping 2 0   Tired, decreased energy 2 0   Change in appetite 2 0   Feeling bad or failure about yourself  1 0   Trouble  concentrating 1 0   Moving slowly or fidgety/restless 0 0   Suicidal thoughts 1 0   PHQ-9 Score 11 0   Difficult doing work/chores Somewhat difficult Not difficult at all       03/31/2024    2:07 PM 01/23/2024   10:48 AM 12/11/2022    8:28 AM 09/24/2022   10:51 AM  GAD 7 : Generalized Anxiety Score  Nervous, Anxious, on Edge 1 0 1 1  Control/stop worrying 1 0 1 1  Worry too much - different things 1 0 1 1  Trouble relaxing 1 0 1 1  Restless 1 0 1 1  Easily annoyed or irritable 0 0 1 1  Afraid - awful might happen 0 0 1 1  Total GAD 7 Score 5 0 7 7  Anxiety Difficulty Somewhat difficult Not difficult at all Somewhat difficult      Assessment/ Plan: Salomon LITTIE Birmingham here for annual physical exam.   Annual physical exam  Foamy urine - Plan: CMP14+EGFR, Bayer DCA Hb A1c Waived, Urinalysis, Routine w reflex microscopic, Microalbumin / creatinine urine ratio  Hypertensive heart disease with chronic diastolic congestive heart failure (HCC) - Plan: CMP14+EGFR, losartan -hydrochlorothiazide  (HYZAAR) 50-12.5 MG tablet  Aortic atherosclerosis (HCC) - Plan: CMP14+EGFR, TSH, atorvastatin  (LIPITOR) 10 MG tablet  Benign prostatic hyperplasia with nocturia - Plan: CMP14+EGFR, PSA  Pre-diabetes - Plan: CMP14+EGFR, Bayer DCA Hb A1c Waived  Dyslipidemia - Plan: atorvastatin  (LIPITOR) 10 MG tablet, Lipid Panel  GAD (generalized anxiety disorder) - Plan: escitalopram  (LEXAPRO ) 20 MG tablet, busPIRone  (BUSPAR ) 10 MG tablet  Major depressive disorder with single episode, in partial remission (HCC) - Plan: escitalopram  (LEXAPRO ) 20 MG tablet  Anxiety and depression are not totally controlled on PHQ and GAD but he reported it being good when questioned about it.  Will leave medicines the same for now but I encouraged him to follow-up with me should things change  Will check for metabolic etiology of foamy urine, check renal function, protein in the urine both  macro and micro.  Encourage p.o. hydration  with water alone.  Will plan for referral to nephrology if proteinuria present.  Blood pressure well-controlled today  Fasting labs were collected  He will continue all medications as prescribed for now  Counseled on healthy lifestyle choices, including diet (rich in fruits, vegetables and lean meats and low in salt and simple carbohydrates) and exercise (at least 30 minutes of moderate physical activity daily).  Patient to follow up 1 year for CPE  Jenie Parish M. Jolinda, DO

## 2024-04-01 LAB — LIPID PANEL
Chol/HDL Ratio: 2.9 ratio (ref 0.0–5.0)
Cholesterol, Total: 113 mg/dL (ref 100–199)
HDL: 39 mg/dL — ABNORMAL LOW (ref >39)
LDL Chol Calc (NIH): 52 mg/dL (ref 0–99)
Triglycerides: 122 mg/dL (ref 0–149)
VLDL Cholesterol Cal: 22 mg/dL (ref 5–40)

## 2024-04-01 LAB — CMP14+EGFR
ALT: 19 IU/L (ref 0–44)
AST: 25 IU/L (ref 0–40)
Albumin: 4.6 g/dL (ref 3.9–4.9)
Alkaline Phosphatase: 99 IU/L (ref 44–121)
BUN/Creatinine Ratio: 20 (ref 10–24)
BUN: 20 mg/dL (ref 8–27)
Bilirubin Total: 0.6 mg/dL (ref 0.0–1.2)
CO2: 23 mmol/L (ref 20–29)
Calcium: 9.2 mg/dL (ref 8.6–10.2)
Chloride: 97 mmol/L (ref 96–106)
Creatinine, Ser: 1.01 mg/dL (ref 0.76–1.27)
Globulin, Total: 2.1 g/dL (ref 1.5–4.5)
Glucose: 88 mg/dL (ref 70–99)
Potassium: 4 mmol/L (ref 3.5–5.2)
Sodium: 137 mmol/L (ref 134–144)
Total Protein: 6.7 g/dL (ref 6.0–8.5)
eGFR: 81 mL/min/1.73 (ref >59)

## 2024-04-01 LAB — MICROALBUMIN / CREATININE URINE RATIO
Creatinine, Urine: 184.1 mg/dL
Microalb/Creat Ratio: 3 mg/g{creat} (ref 0–29)
Microalbumin, Urine: 6.2 ug/mL

## 2024-04-01 LAB — TSH: TSH: 2 u[IU]/mL (ref 0.450–4.500)

## 2024-04-01 LAB — PSA: Prostate Specific Ag, Serum: 0.7 ng/mL (ref 0.0–4.0)

## 2024-04-05 ENCOUNTER — Other Ambulatory Visit

## 2024-04-05 ENCOUNTER — Ambulatory Visit: Payer: Self-pay | Admitting: Family Medicine

## 2024-04-05 DIAGNOSIS — R82998 Other abnormal findings in urine: Secondary | ICD-10-CM

## 2024-04-05 LAB — URINALYSIS, ROUTINE W REFLEX MICROSCOPIC
Bilirubin, UA: NEGATIVE
Ketones, UA: NEGATIVE
Leukocytes,UA: NEGATIVE
Nitrite, UA: NEGATIVE
Protein,UA: NEGATIVE
RBC, UA: NEGATIVE
Specific Gravity, UA: 1.02 (ref 1.005–1.030)
Urobilinogen, Ur: >8 mg/dL — ABNORMAL HIGH (ref 0.2–1.0)
pH, UA: 6 (ref 5.0–7.5)

## 2024-04-06 LAB — CBC WITH DIFFERENTIAL/PLATELET
Basophils Absolute: 0.1 x10E3/uL (ref 0.0–0.2)
Basos: 2 %
EOS (ABSOLUTE): 0.3 x10E3/uL (ref 0.0–0.4)
Eos: 4 %
Hematocrit: 42.2 % (ref 37.5–51.0)
Hemoglobin: 13.8 g/dL (ref 13.0–17.7)
Immature Grans (Abs): 0 x10E3/uL (ref 0.0–0.1)
Immature Granulocytes: 0 %
Lymphocytes Absolute: 1.9 x10E3/uL (ref 0.7–3.1)
Lymphs: 29 %
MCH: 30.6 pg (ref 26.6–33.0)
MCHC: 32.7 g/dL (ref 31.5–35.7)
MCV: 94 fL (ref 79–97)
Monocytes Absolute: 0.7 x10E3/uL (ref 0.1–0.9)
Monocytes: 11 %
Neutrophils Absolute: 3.6 x10E3/uL (ref 1.4–7.0)
Neutrophils: 54 %
Platelets: 250 x10E3/uL (ref 150–450)
RBC: 4.51 x10E6/uL (ref 4.14–5.80)
RDW: 12.7 % (ref 11.6–15.4)
WBC: 6.6 x10E3/uL (ref 3.4–10.8)

## 2024-04-06 LAB — LACTATE DEHYDROGENASE: LDH: 148 IU/L (ref 121–224)

## 2024-04-07 ENCOUNTER — Encounter: Payer: Self-pay | Admitting: Family Medicine

## 2024-04-12 ENCOUNTER — Ambulatory Visit

## 2024-04-12 DIAGNOSIS — I639 Cerebral infarction, unspecified: Secondary | ICD-10-CM

## 2024-04-13 LAB — CUP PACEART REMOTE DEVICE CHECK
Date Time Interrogation Session: 20250825112136
Implantable Pulse Generator Implant Date: 20221007
Pulse Gen Model: 436066
Pulse Gen Serial Number: 94077085

## 2024-04-18 ENCOUNTER — Ambulatory Visit: Payer: Self-pay | Admitting: Cardiology

## 2024-05-05 NOTE — Progress Notes (Signed)
 Remote Loop Recorder Transmission

## 2024-05-13 ENCOUNTER — Ambulatory Visit

## 2024-05-13 DIAGNOSIS — I639 Cerebral infarction, unspecified: Secondary | ICD-10-CM | POA: Diagnosis not present

## 2024-05-13 LAB — CUP PACEART REMOTE DEVICE CHECK
Date Time Interrogation Session: 20250925103126
Implantable Pulse Generator Implant Date: 20221007
Pulse Gen Model: 436066
Pulse Gen Serial Number: 94077085

## 2024-05-15 ENCOUNTER — Ambulatory Visit: Payer: Self-pay | Admitting: Cardiology

## 2024-05-17 NOTE — Progress Notes (Signed)
 Remote Loop Recorder Transmission

## 2024-05-20 NOTE — Progress Notes (Signed)
 Remote Loop Recorder Transmission

## 2024-05-27 ENCOUNTER — Encounter: Payer: Self-pay | Admitting: Dermatology

## 2024-05-27 ENCOUNTER — Ambulatory Visit: Admitting: Dermatology

## 2024-05-27 VITALS — BP 129/66 | HR 68

## 2024-05-27 DIAGNOSIS — L820 Inflamed seborrheic keratosis: Secondary | ICD-10-CM

## 2024-05-27 DIAGNOSIS — L578 Other skin changes due to chronic exposure to nonionizing radiation: Secondary | ICD-10-CM | POA: Diagnosis not present

## 2024-05-27 DIAGNOSIS — W908XXA Exposure to other nonionizing radiation, initial encounter: Secondary | ICD-10-CM

## 2024-05-27 NOTE — Patient Instructions (Addendum)

## 2024-05-27 NOTE — Progress Notes (Signed)
   New Patient Visit   Subjective  Derek Ryan is a 68 y.o. male who presents for the following: growth Pt has a growth on his right temple for several years. He doesn't know if it's changed, may be darker. He has no hx of skin cancer nor family hx   The following portions of the chart were reviewed this encounter and updated as appropriate: medications, allergies, medical history  Review of Systems:  No other skin or systemic complaints except as noted in HPI or Assessment and Plan.  Objective  Well appearing patient in no apparent distress; mood and affect are within normal limits.  A focused examination was performed of the following areas: Right temple  Relevant exam findings are noted in the Assessment and Plan.  Right Temple Inflamed brown stuck on papule  Assessment & Plan   ACTINIC DAMAGE - chronic, secondary to cumulative UV radiation exposure/sun exposure over time - diffuse scaly erythematous macules with underlying dyspigmentation - Recommend daily broad spectrum sunscreen SPF 30+ to sun-exposed areas, reapply every 2 hours as needed.  - Recommend staying in the shade or wearing long sleeves, sun glasses (UVA+UVB protection) and wide brim hats (4-inch brim around the entire circumference of the hat). - Call for new or changing lesions.  INFLAMED SEBORRHEIC KERATOSIS Right Temple Destruction of lesion - Right Temple Complexity: simple   Destruction method: cryotherapy   Informed consent: discussed and consent obtained   Timeout:  patient name, date of birth, surgical site, and procedure verified Outcome: patient tolerated procedure with difficulty   Post-procedure details: wound care instructions given     Return in about 2 months (around 07/27/2024) for recheck spot on right temple.  I, Darice Smock, CMA, am acting as scribe for RUFUS CHRISTELLA HOLY, MD.   Documentation: I have reviewed the above documentation for accuracy and completeness, and I agree with the  above.  RUFUS CHRISTELLA HOLY, MD

## 2024-06-14 ENCOUNTER — Telehealth: Payer: Self-pay

## 2024-06-14 ENCOUNTER — Ambulatory Visit (INDEPENDENT_AMBULATORY_CARE_PROVIDER_SITE_OTHER)

## 2024-06-14 ENCOUNTER — Ambulatory Visit: Payer: Self-pay | Admitting: Cardiology

## 2024-06-14 DIAGNOSIS — I639 Cerebral infarction, unspecified: Secondary | ICD-10-CM

## 2024-06-14 LAB — CUP PACEART REMOTE DEVICE CHECK
Date Time Interrogation Session: 20251027111546
Implantable Pulse Generator Implant Date: 20221007
Pulse Gen Model: 436066
Pulse Gen Serial Number: 94077085

## 2024-06-14 NOTE — Telephone Encounter (Signed)
 BIOTRONIK ROUTINE TRANSMISSION REVIEW:

## 2024-06-15 NOTE — Telephone Encounter (Signed)
 Reviewed transmission with Biotronik: [place].  Regarding noisy channel.  EGM consistent with SR, no evidence of A fib, visible p-waves.  No changes to make regarding sensing.  Normal function - continue to monitor.   Provider sent routine transmission for review through [employer].

## 2024-06-17 NOTE — Progress Notes (Signed)
 Remote Loop Recorder Transmission

## 2024-06-21 ENCOUNTER — Encounter: Payer: Self-pay | Admitting: Radiology

## 2024-06-24 ENCOUNTER — Ambulatory Visit (INDEPENDENT_AMBULATORY_CARE_PROVIDER_SITE_OTHER)

## 2024-06-24 VITALS — BP 129/66 | HR 68 | Ht 68.0 in | Wt 233.0 lb

## 2024-06-24 DIAGNOSIS — Z0000 Encounter for general adult medical examination without abnormal findings: Secondary | ICD-10-CM | POA: Diagnosis not present

## 2024-06-24 NOTE — Progress Notes (Signed)
 Subjective:   Derek Ryan is a 68 y.o. male who presents for a Medicare Annual Wellness Visit.  I connected with  Derek Ryan on 06/24/24 by a audio enabled telemedicine application and verified that I am speaking with the correct person using two identifiers.  Patient Location: Home  Provider Location: Home Office  I discussed the limitations of evaluation and management by telemedicine. The patient expressed understanding and agreed to proceed.   Allergies (verified) Doxycycline   History: Past Medical History:  Diagnosis Date   Anxiety    Arthritis    Central retinal artery occlusion of left eye    Chronic diastolic heart failure (HCC) 08/25/2019   Cryptogenic stroke (HCC) 05/26/2021   Depression    Dyspnea    with exertion   GERD (gastroesophageal reflux disease)    History of kidney stones    passed stone   Hyperlipidemia    Hypertension    Loop recorder Biomonitor-Biotronik 05/25/2021 05/26/2021   LVH (left ventricular hypertrophy) due to hypertensive disease 08/25/2019   OSA (obstructive sleep apnea)    unable to tolerate CPAP - does not use CPAP   Pneumonia    x 1   Pre-diabetes    no  meds   Retinal artery branch occlusion, 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   Past Surgical History:  Procedure Laterality Date   COLONOSCOPY     x multiple   LOOP RECORDER IMPLANT     place in MD's office using local   TOTAL HIP ARTHROPLASTY Left 10/13/2023   Procedure: LEFT TOTAL HIP ARTHROPLASTY ANTERIOR APPROACH;  Surgeon: Jerri Kay HERO, MD;  Location: [facility] OR;  Service: Orthopedics;  Laterality: Left;  3-C   Family History  Problem Relation Age of Onset   Heart attack Mother    Alcohol abuse Father    Stomach cancer Sister    Hypertension Brother    Heart attack Brother    Brain cancer Brother    Heart attack Nephew 49       deceased from massive MI   Social History   Occupational History   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  Tobacco Use   Smoking status: Former     Current packs/day: 0.00    Average packs/day: 1 pack/day for 23.0 years (23.0 ttl pk-yrs)    Types: Cigarettes    Start date: 55    Quit date: 1992    Years since quitting: 33.8   Smokeless tobacco: Never  Vaping Use   Vaping status: Never Used  Substance and Sexual Activity   Alcohol use: Not Currently    Comment: stopped in 1997   Drug use: Never   Sexual activity: Not Currently   Tobacco Counseling Counseling given: Yes  SDOH Screenings   Food Insecurity: No Food Insecurity (06/24/2024)  Housing: Unknown (06/24/2024)  Transportation Needs: No Transportation Needs (06/24/2024)  Utilities: Not At Risk (06/24/2024)  Alcohol Screen: Low Risk  (06/23/2023)  Depression (PHQ2-9): Low Risk  (06/24/2024)  Recent Concern: Depression (PHQ2-9) - High Risk (03/31/2024)  Financial Resource Strain: Low Risk  (06/23/2023)  Physical Activity: Insufficiently Active (06/23/2023)  Social Connections: Moderately Isolated (06/24/2024)  Stress: No Stress Concern Present (06/24/2024)  Tobacco Use: Medium Risk (06/24/2024)  Health Literacy: Adequate Health Literacy (06/24/2024)   Depression Screen    06/24/2024    9:23 AM 03/31/2024    2:06 PM 01/23/2024   10:48 AM 06/23/2023    8:31 AM 12/11/2022    8:28 AM 12/11/2022    8:21  AM 09/24/2022   10:51 AM  PHQ 2/9 Scores  PHQ - 2 Score 0 2 0 0 2 0 1  PHQ- 9 Score  11 0  9  6     Goals Addressed             This Visit's Progress    exercise   On track    Pt would like to eat better and exercise more.        Visit info / Clinical Intake: Medicare Wellness Visit Type:: Subsequent Annual Wellness Visit Medicare Wellness Visit Mode:: Telephone If telephone:: video declined If telephone or video:: vitals recorded from last visit Interpreter Needed?: No Pre-visit prep was completed: yes AWV questionnaire completed by patient prior to visit?: no Living arrangements:: (!) lives alone Patient's Overall Health Status Rating: very good Typical amount of  pain: none Does pain affect daily life?: no Are you currently prescribed opioids?: no  Dietary Habits and Nutritional Risks How many meals a day?: 3 Eats fruit and vegetables daily?: yes Most meals are obtained by: preparing own meals Diabetic:: no  Functional Status Activities of Daily Living (to include ambulation/medication): Independent Ambulation: Independent Medication Administration: Independent Home Management: Independent Manage your own finances?: yes Primary transportation is: driving Concerns about hearing?: no  Fall Screening Falls in the past year?: 0 Number of falls in past year: 0 Was there an injury with Fall?: 0 Fall Risk Category Calculator: 0 Patient Fall Risk Level: Low Fall Risk  Fall Risk Patient at Risk for Falls Due to: No Fall Risks Fall risk Follow up: Falls evaluation completed; Education provided  Home and Transportation Safety: All rugs have non-skid backing?: yes All stairs or steps have railings?: yes Grab bars in the bathtub or shower?: yes Good home lighting?: yes Regular seat belt use?: yes Hospital stays in the last year:: (!) no; yes How many hospital stays:: 1 Reason: hip replacement in Feb  Cognitive Assessment Difficulty concentrating, remembering, or making decisions? : no Will 6CIT or Mini Cog be Completed: yes What year is it?: 0 points What month is it?: 0 points Give patient an address phrase to remember (5 components): [address] [place], [place] About what time is it?: 0 points Count backwards from 20 to 1: 0 points Say the months of the year in reverse: 0 points Repeat the address phrase from earlier: 0 points 6 CIT Score: 0 points  Advance Directives (For Healthcare) Does Patient Have a Medical Advance Directive?: No Would patient like information on creating a medical advance directive?: Yes (MAU/Ambulatory/Procedural Areas - Information given) (pt is aware to get info at pcp's office)  Reviewed/Updated   Reviewed/Updated: All; Medical History; Surgical History; Family History; Medications; Allergies; Care Teams; Patient Goals        Objective:    There were no vitals filed for this visit. There is no height or weight on file to calculate BMI.  Current Medications (verified) Outpatient Encounter Medications as of 06/24/2024  Medication Sig   aspirin  (ASPIRIN  81) 81 MG chewable tablet Chew 1 tablet (81 mg total) by mouth 2 (two) times daily. To be taken after surgery to prevent blood clots   atorvastatin  (LIPITOR) 10 MG tablet TAKE 1 TABLET BY MOUTH EVERYDAY AT BEDTIME   busPIRone  (BUSPAR ) 10 MG tablet TAKE 1 TABLET (10 MG TOTAL) BY MOUTH 3 (THREE) TIMES DAILY. FOR ANXIETY   Cholecalciferol (VITAMIN D) 50 MCG (2000 UT) tablet Take 2,000 Units by mouth daily.   escitalopram  (LEXAPRO ) 20 MG  tablet Take 1 tablet (20 mg total) by mouth daily.   losartan -hydrochlorothiazide  (HYZAAR) 50-12.5 MG tablet Take 1 tablet by mouth daily.   vitamin C (ASCORBIC ACID) 500 MG tablet Take 500 mg by mouth daily.   No facility-administered encounter medications on file as of 06/24/2024.   Hearing/Vision screen Hearing Screening - Comments:: Pt denies hearing dif Vision Screening - Comments:: Pt have vision dif w/close up--use glasses for close/pt goes to [facility] In [place], August/last ov several yrs  Immunizations and Health Maintenance Health Maintenance  Topic Date Due   Zoster Vaccines- Shingrix (1 of 2) Never done   Pneumococcal Vaccine: 50+ Years (2 of 2 - PPSV23, PCV20, or PCV21) 09/20/2019   Influenza Vaccine  Never done   COVID-19 Vaccine (5 - 2025-26 season) 04/19/2024   Colonoscopy  04/29/2025   Medicare Annual Wellness (AWV)  06/24/2025   DTaP/Tdap/Td (3 - Td or Tdap) 06/30/2033   Hepatitis C Screening  Completed   Meningococcal B Vaccine  Aged Out        Assessment/Plan:  This is a routine wellness examination for Derek Ryan.  Patient Care Team: Jolinda Norene HERO, DO as PCP - General  (Family Medicine) Ladona Heinz, MD as PCP - Cardiology (Cardiology)  I have personally reviewed and noted the following in the patient's chart:   Medical and social history Use of alcohol, tobacco or illicit drugs  Current medications and supplements including opioid prescriptions. Functional ability and status Nutritional status Physical activity Advanced directives List of other physicians Hospitalizations, surgeries, and ER visits in previous 12 months Vitals Screenings to include cognitive, depression, and falls Referrals and appointments  No orders of the defined types were placed in this encounter.  In addition, I have reviewed and discussed with patient certain preventive protocols, quality metrics, and best practice recommendations. A written personalized care plan for preventive services as well as general preventive health recommendations were provided to patient.   Derek Ryan, CMA   06/24/2024   Return in 1 year (on 06/24/2025).  After Visit Summary: (MyChart) Due to this being a telephonic visit, the after visit summary with patients personalized plan was offered to patient via MyChart   Nurse Notes: pt is aware and due the following: covid, flu, pneumonia and shingles vaccines

## 2024-07-15 ENCOUNTER — Ambulatory Visit

## 2024-07-15 DIAGNOSIS — I639 Cerebral infarction, unspecified: Secondary | ICD-10-CM | POA: Diagnosis not present

## 2024-07-16 LAB — CUP PACEART REMOTE DEVICE CHECK
Date Time Interrogation Session: 20251128092020
Implantable Pulse Generator Implant Date: 20221007
Pulse Gen Model: 436066
Pulse Gen Serial Number: 94077085

## 2024-07-19 ENCOUNTER — Ambulatory Visit: Payer: Self-pay | Admitting: Cardiology

## 2024-07-20 NOTE — Progress Notes (Signed)
 Remote Loop Recorder Transmission

## 2024-08-09 ENCOUNTER — Ambulatory Visit: Admitting: Dermatology

## 2024-08-15 ENCOUNTER — Ambulatory Visit

## 2024-08-15 DIAGNOSIS — I639 Cerebral infarction, unspecified: Secondary | ICD-10-CM

## 2024-08-17 LAB — CUP PACEART REMOTE DEVICE CHECK
Date Time Interrogation Session: 20251230100406
Implantable Pulse Generator Implant Date: 20221007
Pulse Gen Model: 436066
Pulse Gen Serial Number: 94077085

## 2024-08-21 ENCOUNTER — Ambulatory Visit: Payer: Self-pay | Admitting: Cardiology

## 2024-08-23 NOTE — Progress Notes (Signed)
 Remote Loop Recorder Transmission

## 2024-09-15 ENCOUNTER — Ambulatory Visit

## 2024-09-15 DIAGNOSIS — I639 Cerebral infarction, unspecified: Secondary | ICD-10-CM

## 2024-09-15 LAB — CUP PACEART REMOTE DEVICE CHECK
Date Time Interrogation Session: 20260128090745
Implantable Pulse Generator Implant Date: 20221007
Pulse Gen Model: 436066
Pulse Gen Serial Number: 94077085

## 2024-09-16 ENCOUNTER — Ambulatory Visit: Payer: Self-pay

## 2024-09-16 NOTE — Telephone Encounter (Signed)
 FYI Only or Action Required?: FYI only for provider: appointment scheduled on 1/30.  Patient was last seen in primary care on 03/31/2024 by Derek Norene HERO, DO.  Called Nurse Triage reporting Back Pain and Flank Pain.  Symptoms began a week ago.  Interventions attempted: Rest, hydration, or home remedies.  Symptoms are: gradually worsening.  Triage Disposition: See Physician Within 24 Hours  Patient/caregiver understands and will follow disposition?: Yes    1 week ago onset intermittent pain in left flank. 5-6/10 sharp pain, lasts up to 15 minutes. No CP or SOB. No recent strain or injury. No blood in urine or cloudiness, has an odor. No fever. No pain with urination. No urine retention. Some nausea, no emesis. Scheduled appt with PCP tomorrow. Advised UC or ED for worsening symptoms.     Message from [name] sent at 09/16/2024  1:27 PM EST  Reason for Triage: Patient calling in about left side of ribs/back intermittent pain over a week.   Reason for Disposition  MODERATE pain (e.g., interferes with normal activities or awakens from sleep)  Answer Assessment - Initial Assessment Questions 1. LOCATION: Where does it hurt? (e.g., left, right)     Left flank  2. ONSET: When did the pain start?     1 week ago  3. SEVERITY: How bad is the pain? (e.g., Scale 1-10; mild, moderate, or severe)      5-6/10 sharp pain, lasts up to 15 minutes  4. PATTERN: Does the pain come and go, or is it constant?      Intermittent, lasts up to 15 minutes. Occurs every few hours. Hurts more laying on left side.  5. CAUSE: What do you think is causing the pain?     Unsure  6. OTHER SYMPTOMS:  Do you have any other symptoms? (e.g., fever, abdomen pain, vomiting, leg weakness, burning with urination, blood in urine)     No  Protocols used: Flank Pain-A-AH

## 2024-09-16 NOTE — Progress Notes (Signed)
 "  Subjective: CC: Left flank pain PCP: Derek Derek HERO, DO YEP:[name] is a 69 y.o. male presenting to clinic today for:  Patient reports that he has had left flank pain for about a week now.  He points to the left lateral flank as well and notes that today it is not as severe as it had been over the last week but he has had some intermittent nausea.  No fevers, chills, dysuria, hematuria.  He does have a history of renal stone in the past.  Additionally, noted to have elevation urobilinogen on last couple of urinalysis.  It was recommended that he have further evaluation with ultrasound and/or GI referral.  Patient does not really report any right upper quadrant pain.  No changes in bowel habits.  No blood in stool.  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 05/26/2021   LVH (left ventricular hypertrophy) due to hypertensive disease 08/25/2019   OSA (obstructive sleep apnea)    unable to tolerate CPAP - does not use CPAP   Pneumonia    x 1   Pre-diabetes    no  meds   Retinal artery branch occlusion, 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   Current Medications[2] Social History   Socioeconomic History   Marital status: Widowed    Spouse name: Not on file   Number of children: 3   Years of education: Not on file   Highest education level: Not on file  Occupational History   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   Works at [employer] Part time.    Social Drivers of Health   Tobacco Use: Medium Risk (06/24/2024)   Patient History    Smoking Tobacco Use: Former    Smokeless Tobacco Use: Never    Passive Exposure: Not on file  Financial Resource Strain: Low Risk (06/23/2023)   Overall Financial Resource Strain (CARDIA)    Difficulty of Paying Living Expenses: Not hard at all  Food Insecurity: No Food Insecurity (06/24/2024)   Epic    Worried About Programme Researcher, Broadcasting/film/video in the Last Year: Never true    Ran Out of Food in the Last Year: Never true  Transportation Needs: No Transportation Needs (06/24/2024)   Epic    Lack of Transportation (Medical): No  Lack of Transportation (Non-Medical): No  Physical Activity: Insufficiently Active (06/24/2024)   Exercise Vital Sign    Days of Exercise per Week: 3 days    Minutes of Exercise per Session: 30 min  Stress: No Stress Concern Present (06/24/2024)   [employer] of Occupational Health - Occupational Stress Questionnaire    Feeling of Stress: Only a little  Social Connections: Moderately Isolated (06/24/2024)   Social Connection and Isolation Panel    Frequency of Communication with Friends and Family: More than three times a week    Frequency of Social Gatherings with Friends and Family: More than three times a week    Attends Religious Services: More than 4 times per year    Active Member of [employer] or Organizations: No    Attends Banker Meetings: Never    Marital Status: Widowed  Intimate Partner Violence: Not At Risk  (06/24/2024)   Epic    Fear of Current or Ex-Partner: No    Emotionally Abused: No    Physically Abused: No    Sexually Abused: No  Depression (PHQ2-9): Low Risk (06/24/2024)   Depression (PHQ2-9)    PHQ-2 Score: 0  Recent Concern: Depression (PHQ2-9) - High Risk (03/31/2024)   Depression (PHQ2-9)    PHQ-2 Score: 11  Alcohol Screen: Low Risk (06/23/2023)   Alcohol Screen    Last Alcohol Screening Score (AUDIT): 0  Housing: Unknown (06/24/2024)   Epic    Unable to Pay for Housing in the Last Year: No    Number of Times Moved in the Last Year: Not on file    Homeless in the Last Year: No  Utilities: Not At Risk (06/24/2024)   Epic    Threatened with loss of utilities: No  Health Literacy: Adequate Health Literacy (06/24/2024)   B1300 Health Literacy    Frequency of need for help with medical instructions: Never   Family History  Problem Relation Age of Onset   Heart attack Mother    Alcohol abuse Father    Stomach cancer Sister    Hypertension Brother    Heart attack Brother    Brain cancer Brother    Heart attack Nephew 49       deceased from massive MI    Objective: Office vital signs reviewed. BP 127/63   Pulse 82   Temp 98.2 F (36.8 C)   Ht 5' 8 (1.727 m)   Wt 238 lb 4 oz (108.1 kg)   SpO2 98%   BMI 36.23 kg/m   Physical Examination:  General: Awake, alert, well nourished, No acute distress GU: Left-sided flank tenderness palpation present. GI: Abdomen is protuberant but soft and nontender except in the left upper quadrant.  No rebound or guarding  Assessment/ Plan: 69 y.o. male   Left flank pain - Plan: Urinalysis, Routine w reflex microscopic, CT RENAL STONE STUDY  Left upper quadrant abdominal pain - Plan: Urinalysis, Routine w reflex microscopic, CT RENAL STONE STUDY   Urinalysis with persistent urobilinogen but no other significant findings except for some trace glucose.  Will order CTA to eval for possible renal stone and hopefully we can get a good  look at the gallbladder/liver on this imaging study.  Discussed that next apps for the urobilinogen would in fact be referral to gastroenterology   Derek CHRISTELLA Fielding, DO [facility] [phone]     [1]  Allergies Allergen Reactions   Doxycycline Itching    As of 03/31/20, pt does not recall  this reaction  [2]  Current Outpatient Medications:    aspirin  (ASPIRIN  81) 81 MG chewable tablet, Chew 1 tablet (81 mg total) by mouth 2 (two) times daily. To be taken after surgery to prevent blood clots, Disp: 84 tablet, Rfl: 0   atorvastatin  (LIPITOR) 10 MG tablet, TAKE 1 TABLET BY MOUTH EVERYDAY AT BEDTIME, Disp: 100 tablet, Rfl: 3   busPIRone  (BUSPAR ) 10 MG tablet, TAKE 1 TABLET (10 MG TOTAL) BY MOUTH 3 (THREE) TIMES DAILY. FOR ANXIETY, Disp: 270 tablet, Rfl: 3   Cholecalciferol (VITAMIN D) 50 MCG (2000 UT) tablet, Take 2,000 Units by mouth daily., Disp: , Rfl:    escitalopram  (LEXAPRO ) 20 MG tablet, Take 1 tablet (20 mg total) by mouth daily., Disp: 100 tablet, Rfl: 3   losartan -hydrochlorothiazide  (HYZAAR) 50-12.5 MG tablet, Take 1 tablet by mouth daily., Disp: 100 tablet, Rfl: 3   vitamin C (ASCORBIC ACID) 500 MG tablet, Take 500 mg by mouth daily., Disp: , Rfl:   "

## 2024-09-16 NOTE — Telephone Encounter (Signed)
 noted

## 2024-09-17 ENCOUNTER — Ambulatory Visit: Payer: Self-pay | Admitting: Family Medicine

## 2024-09-17 ENCOUNTER — Ambulatory Visit (HOSPITAL_COMMUNITY)
Admission: RE | Admit: 2024-09-17 | Discharge: 2024-09-17 | Disposition: A | Source: Ambulatory Visit | Attending: Family Medicine

## 2024-09-17 ENCOUNTER — Ambulatory Visit (INDEPENDENT_AMBULATORY_CARE_PROVIDER_SITE_OTHER): Admitting: Family Medicine

## 2024-09-17 ENCOUNTER — Encounter: Payer: Self-pay | Admitting: Family Medicine

## 2024-09-17 VITALS — BP 127/63 | HR 82 | Temp 98.2°F | Ht 68.0 in | Wt 238.2 lb

## 2024-09-17 DIAGNOSIS — R1012 Left upper quadrant pain: Secondary | ICD-10-CM | POA: Insufficient documentation

## 2024-09-17 DIAGNOSIS — R10A2 Flank pain, left side: Secondary | ICD-10-CM | POA: Insufficient documentation

## 2024-09-17 LAB — MICROSCOPIC EXAMINATION
Bacteria, UA: NONE SEEN
Renal Epithel, UA: NONE SEEN /HPF

## 2024-09-17 LAB — URINALYSIS, ROUTINE W REFLEX MICROSCOPIC
Bilirubin, UA: NEGATIVE
Ketones, UA: NEGATIVE
Leukocytes,UA: NEGATIVE
Nitrite, UA: NEGATIVE
Protein,UA: NEGATIVE
RBC, UA: NEGATIVE
Specific Gravity, UA: 1.015 (ref 1.005–1.030)
Urobilinogen, Ur: 4 mg/dL — ABNORMAL HIGH (ref 0.2–1.0)
pH, UA: 7 (ref 5.0–7.5)

## 2024-09-19 ENCOUNTER — Ambulatory Visit: Payer: Self-pay | Admitting: Cardiology

## 2024-09-23 NOTE — Progress Notes (Signed)
 Remote Loop Recorder Transmission

## 2024-10-16 ENCOUNTER — Encounter

## 2024-11-16 ENCOUNTER — Encounter

## 2024-11-18 ENCOUNTER — Ambulatory Visit: Admitting: Cardiology

## 2025-04-08 ENCOUNTER — Encounter: Payer: Self-pay | Admitting: Family Medicine

## 2025-06-28 ENCOUNTER — Ambulatory Visit
